# Patient Record
Sex: Female | Born: 1950
Health system: Southern US, Community
[De-identification: ages and names within clinical notes are randomized; demographics above are authoritative.]

## PROBLEM LIST (undated history)

## (undated) DIAGNOSIS — E119 Type 2 diabetes mellitus without complications: Secondary | ICD-10-CM

## (undated) DIAGNOSIS — I10 Essential (primary) hypertension: Secondary | ICD-10-CM

## (undated) DIAGNOSIS — C801 Malignant (primary) neoplasm, unspecified: Secondary | ICD-10-CM

## (undated) HISTORY — PX: CHOLECYSTECTOMY: SHX55

## (undated) HISTORY — PX: BACK SURGERY: SHX140

## (undated) HISTORY — PX: BREAST LUMPECTOMY: SHX2

---

## 2004-08-23 ENCOUNTER — Inpatient Hospital Stay (HOSPITAL_COMMUNITY): Admission: RE | Admit: 2004-08-23 | Discharge: 2004-08-24 | Payer: Self-pay | Admitting: Neurosurgery

## 2004-10-30 ENCOUNTER — Encounter (HOSPITAL_COMMUNITY): Admission: RE | Admit: 2004-10-30 | Discharge: 2004-11-03 | Payer: Self-pay | Admitting: Neurosurgery

## 2004-11-06 ENCOUNTER — Encounter (HOSPITAL_COMMUNITY): Admission: RE | Admit: 2004-11-06 | Discharge: 2004-12-06 | Payer: Self-pay | Admitting: Neurosurgery

## 2004-12-11 ENCOUNTER — Encounter (HOSPITAL_COMMUNITY): Admission: RE | Admit: 2004-12-11 | Discharge: 2005-01-10 | Payer: Self-pay | Admitting: Neurosurgery

## 2005-01-01 ENCOUNTER — Ambulatory Visit (HOSPITAL_COMMUNITY): Admission: RE | Admit: 2005-01-01 | Discharge: 2005-01-01 | Payer: Self-pay | Admitting: Neurosurgery

## 2005-01-11 ENCOUNTER — Encounter (HOSPITAL_COMMUNITY): Admission: RE | Admit: 2005-01-11 | Discharge: 2005-01-15 | Payer: Self-pay | Admitting: Neurosurgery

## 2005-01-17 ENCOUNTER — Ambulatory Visit (HOSPITAL_COMMUNITY): Admission: RE | Admit: 2005-01-17 | Discharge: 2005-01-18 | Payer: Self-pay | Admitting: Neurosurgery

## 2005-02-10 ENCOUNTER — Ambulatory Visit (HOSPITAL_COMMUNITY): Admission: RE | Admit: 2005-02-10 | Discharge: 2005-02-10 | Payer: Self-pay | Admitting: Neurosurgery

## 2005-02-14 ENCOUNTER — Inpatient Hospital Stay (HOSPITAL_COMMUNITY): Admission: RE | Admit: 2005-02-14 | Discharge: 2005-02-19 | Payer: Self-pay | Admitting: Neurosurgery

## 2011-07-29 ENCOUNTER — Encounter: Payer: Self-pay | Admitting: Hematology and Oncology

## 2012-07-29 ENCOUNTER — Encounter (INDEPENDENT_AMBULATORY_CARE_PROVIDER_SITE_OTHER): Payer: Self-pay | Admitting: Internal Medicine

## 2012-07-29 DIAGNOSIS — Z803 Family history of malignant neoplasm of breast: Secondary | ICD-10-CM

## 2012-07-29 DIAGNOSIS — C50919 Malignant neoplasm of unspecified site of unspecified female breast: Secondary | ICD-10-CM

## 2016-02-12 DIAGNOSIS — L820 Inflamed seborrheic keratosis: Secondary | ICD-10-CM | POA: Diagnosis not present

## 2016-02-12 DIAGNOSIS — Z6841 Body Mass Index (BMI) 40.0 and over, adult: Secondary | ICD-10-CM | POA: Diagnosis not present

## 2016-02-12 DIAGNOSIS — Z713 Dietary counseling and surveillance: Secondary | ICD-10-CM | POA: Diagnosis not present

## 2016-02-12 DIAGNOSIS — E1165 Type 2 diabetes mellitus with hyperglycemia: Secondary | ICD-10-CM | POA: Diagnosis not present

## 2016-02-12 DIAGNOSIS — Z299 Encounter for prophylactic measures, unspecified: Secondary | ICD-10-CM | POA: Diagnosis not present

## 2016-02-12 DIAGNOSIS — Z87891 Personal history of nicotine dependence: Secondary | ICD-10-CM | POA: Diagnosis not present

## 2016-02-13 DIAGNOSIS — R69 Illness, unspecified: Secondary | ICD-10-CM | POA: Diagnosis not present

## 2016-02-14 DIAGNOSIS — R69 Illness, unspecified: Secondary | ICD-10-CM | POA: Diagnosis not present

## 2016-02-28 DIAGNOSIS — M5442 Lumbago with sciatica, left side: Secondary | ICD-10-CM | POA: Diagnosis not present

## 2016-03-07 DIAGNOSIS — M5136 Other intervertebral disc degeneration, lumbar region: Secondary | ICD-10-CM | POA: Diagnosis not present

## 2016-03-26 DIAGNOSIS — E119 Type 2 diabetes mellitus without complications: Secondary | ICD-10-CM | POA: Diagnosis not present

## 2016-03-26 DIAGNOSIS — I10 Essential (primary) hypertension: Secondary | ICD-10-CM | POA: Diagnosis not present

## 2016-04-21 DIAGNOSIS — R69 Illness, unspecified: Secondary | ICD-10-CM | POA: Diagnosis not present

## 2016-05-29 DIAGNOSIS — R69 Illness, unspecified: Secondary | ICD-10-CM | POA: Diagnosis not present

## 2016-06-18 DIAGNOSIS — Z299 Encounter for prophylactic measures, unspecified: Secondary | ICD-10-CM | POA: Diagnosis not present

## 2016-06-18 DIAGNOSIS — R69 Illness, unspecified: Secondary | ICD-10-CM | POA: Diagnosis not present

## 2016-06-18 DIAGNOSIS — E1165 Type 2 diabetes mellitus with hyperglycemia: Secondary | ICD-10-CM | POA: Diagnosis not present

## 2016-06-18 DIAGNOSIS — Z6841 Body Mass Index (BMI) 40.0 and over, adult: Secondary | ICD-10-CM | POA: Diagnosis not present

## 2016-06-18 DIAGNOSIS — I10 Essential (primary) hypertension: Secondary | ICD-10-CM | POA: Diagnosis not present

## 2016-06-20 DIAGNOSIS — M5136 Other intervertebral disc degeneration, lumbar region: Secondary | ICD-10-CM | POA: Diagnosis not present

## 2016-07-23 DIAGNOSIS — E119 Type 2 diabetes mellitus without complications: Secondary | ICD-10-CM | POA: Diagnosis not present

## 2016-07-23 DIAGNOSIS — I10 Essential (primary) hypertension: Secondary | ICD-10-CM | POA: Diagnosis not present

## 2016-07-24 DIAGNOSIS — M5442 Lumbago with sciatica, left side: Secondary | ICD-10-CM | POA: Diagnosis not present

## 2016-07-24 DIAGNOSIS — G8929 Other chronic pain: Secondary | ICD-10-CM | POA: Diagnosis not present

## 2016-07-24 DIAGNOSIS — Z6839 Body mass index (BMI) 39.0-39.9, adult: Secondary | ICD-10-CM | POA: Diagnosis not present

## 2016-07-24 DIAGNOSIS — M5136 Other intervertebral disc degeneration, lumbar region: Secondary | ICD-10-CM | POA: Diagnosis not present

## 2016-07-24 DIAGNOSIS — R69 Illness, unspecified: Secondary | ICD-10-CM | POA: Diagnosis not present

## 2016-07-24 DIAGNOSIS — I10 Essential (primary) hypertension: Secondary | ICD-10-CM | POA: Diagnosis not present

## 2016-09-10 DIAGNOSIS — E119 Type 2 diabetes mellitus without complications: Secondary | ICD-10-CM | POA: Diagnosis not present

## 2016-09-10 DIAGNOSIS — I10 Essential (primary) hypertension: Secondary | ICD-10-CM | POA: Diagnosis not present

## 2016-09-18 DIAGNOSIS — H52229 Regular astigmatism, unspecified eye: Secondary | ICD-10-CM | POA: Diagnosis not present

## 2016-09-18 DIAGNOSIS — H5213 Myopia, bilateral: Secondary | ICD-10-CM | POA: Diagnosis not present

## 2016-09-18 DIAGNOSIS — H524 Presbyopia: Secondary | ICD-10-CM | POA: Diagnosis not present

## 2016-09-24 DIAGNOSIS — M5416 Radiculopathy, lumbar region: Secondary | ICD-10-CM | POA: Diagnosis not present

## 2016-09-24 DIAGNOSIS — Z6838 Body mass index (BMI) 38.0-38.9, adult: Secondary | ICD-10-CM | POA: Diagnosis not present

## 2016-09-24 DIAGNOSIS — E1165 Type 2 diabetes mellitus with hyperglycemia: Secondary | ICD-10-CM | POA: Diagnosis not present

## 2016-09-24 DIAGNOSIS — E669 Obesity, unspecified: Secondary | ICD-10-CM | POA: Diagnosis not present

## 2016-09-24 DIAGNOSIS — I10 Essential (primary) hypertension: Secondary | ICD-10-CM | POA: Diagnosis not present

## 2016-09-24 DIAGNOSIS — Z299 Encounter for prophylactic measures, unspecified: Secondary | ICD-10-CM | POA: Diagnosis not present

## 2016-10-01 DIAGNOSIS — M5442 Lumbago with sciatica, left side: Secondary | ICD-10-CM | POA: Diagnosis not present

## 2016-10-01 DIAGNOSIS — M545 Low back pain: Secondary | ICD-10-CM | POA: Diagnosis not present

## 2016-10-17 DIAGNOSIS — H02831 Dermatochalasis of right upper eyelid: Secondary | ICD-10-CM | POA: Diagnosis not present

## 2016-10-17 DIAGNOSIS — H25812 Combined forms of age-related cataract, left eye: Secondary | ICD-10-CM | POA: Diagnosis not present

## 2016-10-17 DIAGNOSIS — H25811 Combined forms of age-related cataract, right eye: Secondary | ICD-10-CM | POA: Diagnosis not present

## 2016-10-17 DIAGNOSIS — E119 Type 2 diabetes mellitus without complications: Secondary | ICD-10-CM | POA: Diagnosis not present

## 2016-10-29 DIAGNOSIS — H25812 Combined forms of age-related cataract, left eye: Secondary | ICD-10-CM | POA: Diagnosis not present

## 2016-10-29 DIAGNOSIS — H2512 Age-related nuclear cataract, left eye: Secondary | ICD-10-CM | POA: Diagnosis not present

## 2016-11-14 DIAGNOSIS — H25811 Combined forms of age-related cataract, right eye: Secondary | ICD-10-CM | POA: Diagnosis not present

## 2016-11-14 DIAGNOSIS — H2511 Age-related nuclear cataract, right eye: Secondary | ICD-10-CM | POA: Diagnosis not present

## 2016-12-04 DIAGNOSIS — I10 Essential (primary) hypertension: Secondary | ICD-10-CM | POA: Diagnosis not present

## 2016-12-04 DIAGNOSIS — E119 Type 2 diabetes mellitus without complications: Secondary | ICD-10-CM | POA: Diagnosis not present

## 2016-12-17 DIAGNOSIS — E1165 Type 2 diabetes mellitus with hyperglycemia: Secondary | ICD-10-CM | POA: Diagnosis not present

## 2016-12-17 DIAGNOSIS — Z299 Encounter for prophylactic measures, unspecified: Secondary | ICD-10-CM | POA: Diagnosis not present

## 2016-12-17 DIAGNOSIS — Z6838 Body mass index (BMI) 38.0-38.9, adult: Secondary | ICD-10-CM | POA: Diagnosis not present

## 2016-12-17 DIAGNOSIS — I10 Essential (primary) hypertension: Secondary | ICD-10-CM | POA: Diagnosis not present

## 2016-12-17 DIAGNOSIS — M545 Low back pain: Secondary | ICD-10-CM | POA: Diagnosis not present

## 2016-12-31 DIAGNOSIS — M545 Low back pain: Secondary | ICD-10-CM | POA: Diagnosis not present

## 2016-12-31 DIAGNOSIS — Z6838 Body mass index (BMI) 38.0-38.9, adult: Secondary | ICD-10-CM | POA: Diagnosis not present

## 2016-12-31 DIAGNOSIS — E1165 Type 2 diabetes mellitus with hyperglycemia: Secondary | ICD-10-CM | POA: Diagnosis not present

## 2016-12-31 DIAGNOSIS — I10 Essential (primary) hypertension: Secondary | ICD-10-CM | POA: Diagnosis not present

## 2016-12-31 DIAGNOSIS — Z299 Encounter for prophylactic measures, unspecified: Secondary | ICD-10-CM | POA: Diagnosis not present

## 2016-12-31 DIAGNOSIS — R600 Localized edema: Secondary | ICD-10-CM | POA: Diagnosis not present

## 2017-01-01 DIAGNOSIS — I10 Essential (primary) hypertension: Secondary | ICD-10-CM | POA: Diagnosis not present

## 2017-01-01 DIAGNOSIS — E119 Type 2 diabetes mellitus without complications: Secondary | ICD-10-CM | POA: Diagnosis not present

## 2017-01-10 DIAGNOSIS — I10 Essential (primary) hypertension: Secondary | ICD-10-CM | POA: Diagnosis not present

## 2017-01-10 DIAGNOSIS — M5136 Other intervertebral disc degeneration, lumbar region: Secondary | ICD-10-CM | POA: Diagnosis not present

## 2017-01-10 DIAGNOSIS — M5442 Lumbago with sciatica, left side: Secondary | ICD-10-CM | POA: Diagnosis not present

## 2017-01-10 DIAGNOSIS — Z6839 Body mass index (BMI) 39.0-39.9, adult: Secondary | ICD-10-CM | POA: Diagnosis not present

## 2017-01-31 DIAGNOSIS — I10 Essential (primary) hypertension: Secondary | ICD-10-CM | POA: Diagnosis not present

## 2017-01-31 DIAGNOSIS — E119 Type 2 diabetes mellitus without complications: Secondary | ICD-10-CM | POA: Diagnosis not present

## 2017-02-17 DIAGNOSIS — M5136 Other intervertebral disc degeneration, lumbar region: Secondary | ICD-10-CM | POA: Diagnosis not present

## 2017-02-17 DIAGNOSIS — M5416 Radiculopathy, lumbar region: Secondary | ICD-10-CM | POA: Diagnosis not present

## 2017-02-21 DIAGNOSIS — R69 Illness, unspecified: Secondary | ICD-10-CM | POA: Diagnosis not present

## 2017-02-27 DIAGNOSIS — E119 Type 2 diabetes mellitus without complications: Secondary | ICD-10-CM | POA: Diagnosis not present

## 2017-02-27 DIAGNOSIS — I10 Essential (primary) hypertension: Secondary | ICD-10-CM | POA: Diagnosis not present

## 2017-05-01 DIAGNOSIS — R69 Illness, unspecified: Secondary | ICD-10-CM | POA: Diagnosis not present

## 2017-06-02 DIAGNOSIS — E1165 Type 2 diabetes mellitus with hyperglycemia: Secondary | ICD-10-CM | POA: Diagnosis not present

## 2017-06-02 DIAGNOSIS — Z6838 Body mass index (BMI) 38.0-38.9, adult: Secondary | ICD-10-CM | POA: Diagnosis not present

## 2017-06-02 DIAGNOSIS — Z299 Encounter for prophylactic measures, unspecified: Secondary | ICD-10-CM | POA: Diagnosis not present

## 2017-06-02 DIAGNOSIS — I10 Essential (primary) hypertension: Secondary | ICD-10-CM | POA: Diagnosis not present

## 2017-06-06 DIAGNOSIS — E119 Type 2 diabetes mellitus without complications: Secondary | ICD-10-CM | POA: Diagnosis not present

## 2017-06-06 DIAGNOSIS — I10 Essential (primary) hypertension: Secondary | ICD-10-CM | POA: Diagnosis not present

## 2017-07-07 DIAGNOSIS — M5136 Other intervertebral disc degeneration, lumbar region: Secondary | ICD-10-CM | POA: Diagnosis not present

## 2017-07-07 DIAGNOSIS — M5416 Radiculopathy, lumbar region: Secondary | ICD-10-CM | POA: Diagnosis not present

## 2017-08-28 DIAGNOSIS — E119 Type 2 diabetes mellitus without complications: Secondary | ICD-10-CM | POA: Diagnosis not present

## 2017-08-28 DIAGNOSIS — I10 Essential (primary) hypertension: Secondary | ICD-10-CM | POA: Diagnosis not present

## 2017-08-29 DIAGNOSIS — R69 Illness, unspecified: Secondary | ICD-10-CM | POA: Diagnosis not present

## 2017-09-05 DIAGNOSIS — Z299 Encounter for prophylactic measures, unspecified: Secondary | ICD-10-CM | POA: Diagnosis not present

## 2017-09-05 DIAGNOSIS — E1165 Type 2 diabetes mellitus with hyperglycemia: Secondary | ICD-10-CM | POA: Diagnosis not present

## 2017-09-05 DIAGNOSIS — I10 Essential (primary) hypertension: Secondary | ICD-10-CM | POA: Diagnosis not present

## 2017-09-05 DIAGNOSIS — Z6839 Body mass index (BMI) 39.0-39.9, adult: Secondary | ICD-10-CM | POA: Diagnosis not present

## 2017-09-08 DIAGNOSIS — E7800 Pure hypercholesterolemia, unspecified: Secondary | ICD-10-CM | POA: Diagnosis not present

## 2017-09-08 DIAGNOSIS — Z7189 Other specified counseling: Secondary | ICD-10-CM | POA: Diagnosis not present

## 2017-09-08 DIAGNOSIS — Z6841 Body Mass Index (BMI) 40.0 and over, adult: Secondary | ICD-10-CM | POA: Diagnosis not present

## 2017-09-08 DIAGNOSIS — Z0000 Encounter for general adult medical examination without abnormal findings: Secondary | ICD-10-CM | POA: Diagnosis not present

## 2017-09-08 DIAGNOSIS — Z1211 Encounter for screening for malignant neoplasm of colon: Secondary | ICD-10-CM | POA: Diagnosis not present

## 2017-09-08 DIAGNOSIS — Z299 Encounter for prophylactic measures, unspecified: Secondary | ICD-10-CM | POA: Diagnosis not present

## 2017-09-08 DIAGNOSIS — Z79899 Other long term (current) drug therapy: Secondary | ICD-10-CM | POA: Diagnosis not present

## 2017-09-08 DIAGNOSIS — E1165 Type 2 diabetes mellitus with hyperglycemia: Secondary | ICD-10-CM | POA: Diagnosis not present

## 2017-09-08 DIAGNOSIS — R5383 Other fatigue: Secondary | ICD-10-CM | POA: Diagnosis not present

## 2017-09-08 DIAGNOSIS — Z1331 Encounter for screening for depression: Secondary | ICD-10-CM | POA: Diagnosis not present

## 2017-09-08 DIAGNOSIS — Z1339 Encounter for screening examination for other mental health and behavioral disorders: Secondary | ICD-10-CM | POA: Diagnosis not present

## 2017-09-30 DIAGNOSIS — E2839 Other primary ovarian failure: Secondary | ICD-10-CM | POA: Diagnosis not present

## 2017-09-30 DIAGNOSIS — Z79899 Other long term (current) drug therapy: Secondary | ICD-10-CM | POA: Diagnosis not present

## 2017-11-07 DIAGNOSIS — I10 Essential (primary) hypertension: Secondary | ICD-10-CM | POA: Diagnosis not present

## 2017-11-07 DIAGNOSIS — E119 Type 2 diabetes mellitus without complications: Secondary | ICD-10-CM | POA: Diagnosis not present

## 2017-12-04 DIAGNOSIS — M5136 Other intervertebral disc degeneration, lumbar region: Secondary | ICD-10-CM | POA: Diagnosis not present

## 2018-01-02 DIAGNOSIS — R69 Illness, unspecified: Secondary | ICD-10-CM | POA: Diagnosis not present

## 2018-01-07 DIAGNOSIS — I10 Essential (primary) hypertension: Secondary | ICD-10-CM | POA: Diagnosis not present

## 2018-01-07 DIAGNOSIS — E119 Type 2 diabetes mellitus without complications: Secondary | ICD-10-CM | POA: Diagnosis not present

## 2018-01-08 DIAGNOSIS — E1165 Type 2 diabetes mellitus with hyperglycemia: Secondary | ICD-10-CM | POA: Diagnosis not present

## 2018-01-08 DIAGNOSIS — R350 Frequency of micturition: Secondary | ICD-10-CM | POA: Diagnosis not present

## 2018-01-08 DIAGNOSIS — Z299 Encounter for prophylactic measures, unspecified: Secondary | ICD-10-CM | POA: Diagnosis not present

## 2018-01-08 DIAGNOSIS — I10 Essential (primary) hypertension: Secondary | ICD-10-CM | POA: Diagnosis not present

## 2018-01-08 DIAGNOSIS — Z6841 Body Mass Index (BMI) 40.0 and over, adult: Secondary | ICD-10-CM | POA: Diagnosis not present

## 2018-01-08 DIAGNOSIS — N390 Urinary tract infection, site not specified: Secondary | ICD-10-CM | POA: Diagnosis not present

## 2018-01-21 DIAGNOSIS — E1165 Type 2 diabetes mellitus with hyperglycemia: Secondary | ICD-10-CM | POA: Diagnosis not present

## 2018-01-21 DIAGNOSIS — Z299 Encounter for prophylactic measures, unspecified: Secondary | ICD-10-CM | POA: Diagnosis not present

## 2018-01-21 DIAGNOSIS — S81819A Laceration without foreign body, unspecified lower leg, initial encounter: Secondary | ICD-10-CM | POA: Diagnosis not present

## 2018-01-21 DIAGNOSIS — I10 Essential (primary) hypertension: Secondary | ICD-10-CM | POA: Diagnosis not present

## 2018-01-21 DIAGNOSIS — Z6841 Body Mass Index (BMI) 40.0 and over, adult: Secondary | ICD-10-CM | POA: Diagnosis not present

## 2018-02-09 DIAGNOSIS — E119 Type 2 diabetes mellitus without complications: Secondary | ICD-10-CM | POA: Diagnosis not present

## 2018-02-09 DIAGNOSIS — I10 Essential (primary) hypertension: Secondary | ICD-10-CM | POA: Diagnosis not present

## 2018-03-10 DIAGNOSIS — M5416 Radiculopathy, lumbar region: Secondary | ICD-10-CM | POA: Diagnosis not present

## 2018-04-06 DIAGNOSIS — R531 Weakness: Secondary | ICD-10-CM | POA: Diagnosis not present

## 2018-04-06 DIAGNOSIS — I10 Essential (primary) hypertension: Secondary | ICD-10-CM | POA: Diagnosis not present

## 2018-04-06 DIAGNOSIS — R110 Nausea: Secondary | ICD-10-CM | POA: Diagnosis not present

## 2018-04-14 DIAGNOSIS — E1165 Type 2 diabetes mellitus with hyperglycemia: Secondary | ICD-10-CM | POA: Diagnosis not present

## 2018-04-14 DIAGNOSIS — I10 Essential (primary) hypertension: Secondary | ICD-10-CM | POA: Diagnosis not present

## 2018-04-14 DIAGNOSIS — Z6839 Body mass index (BMI) 39.0-39.9, adult: Secondary | ICD-10-CM | POA: Diagnosis not present

## 2018-04-14 DIAGNOSIS — Z299 Encounter for prophylactic measures, unspecified: Secondary | ICD-10-CM | POA: Diagnosis not present

## 2018-04-16 DIAGNOSIS — R69 Illness, unspecified: Secondary | ICD-10-CM | POA: Diagnosis not present

## 2018-05-19 DIAGNOSIS — I10 Essential (primary) hypertension: Secondary | ICD-10-CM | POA: Diagnosis not present

## 2018-05-19 DIAGNOSIS — E119 Type 2 diabetes mellitus without complications: Secondary | ICD-10-CM | POA: Diagnosis not present

## 2018-07-09 DIAGNOSIS — E119 Type 2 diabetes mellitus without complications: Secondary | ICD-10-CM | POA: Diagnosis not present

## 2018-07-09 DIAGNOSIS — I10 Essential (primary) hypertension: Secondary | ICD-10-CM | POA: Diagnosis not present

## 2018-07-13 DIAGNOSIS — M5416 Radiculopathy, lumbar region: Secondary | ICD-10-CM | POA: Diagnosis not present

## 2018-07-21 DIAGNOSIS — R350 Frequency of micturition: Secondary | ICD-10-CM | POA: Diagnosis not present

## 2018-07-21 DIAGNOSIS — I10 Essential (primary) hypertension: Secondary | ICD-10-CM | POA: Diagnosis not present

## 2018-07-21 DIAGNOSIS — Z6838 Body mass index (BMI) 38.0-38.9, adult: Secondary | ICD-10-CM | POA: Diagnosis not present

## 2018-07-21 DIAGNOSIS — E1165 Type 2 diabetes mellitus with hyperglycemia: Secondary | ICD-10-CM | POA: Diagnosis not present

## 2018-07-21 DIAGNOSIS — Z299 Encounter for prophylactic measures, unspecified: Secondary | ICD-10-CM | POA: Diagnosis not present

## 2018-07-21 DIAGNOSIS — N390 Urinary tract infection, site not specified: Secondary | ICD-10-CM | POA: Diagnosis not present

## 2018-08-11 DIAGNOSIS — M5136 Other intervertebral disc degeneration, lumbar region: Secondary | ICD-10-CM | POA: Diagnosis not present

## 2018-08-11 DIAGNOSIS — M5416 Radiculopathy, lumbar region: Secondary | ICD-10-CM | POA: Diagnosis not present

## 2018-10-02 DIAGNOSIS — E119 Type 2 diabetes mellitus without complications: Secondary | ICD-10-CM | POA: Diagnosis not present

## 2018-10-02 DIAGNOSIS — Z1211 Encounter for screening for malignant neoplasm of colon: Secondary | ICD-10-CM | POA: Diagnosis not present

## 2018-10-02 DIAGNOSIS — I10 Essential (primary) hypertension: Secondary | ICD-10-CM | POA: Diagnosis not present

## 2018-10-02 DIAGNOSIS — Z79899 Other long term (current) drug therapy: Secondary | ICD-10-CM | POA: Diagnosis not present

## 2018-10-02 DIAGNOSIS — Z7189 Other specified counseling: Secondary | ICD-10-CM | POA: Diagnosis not present

## 2018-10-02 DIAGNOSIS — Z6838 Body mass index (BMI) 38.0-38.9, adult: Secondary | ICD-10-CM | POA: Diagnosis not present

## 2018-10-02 DIAGNOSIS — Z0000 Encounter for general adult medical examination without abnormal findings: Secondary | ICD-10-CM | POA: Diagnosis not present

## 2018-10-02 DIAGNOSIS — E1165 Type 2 diabetes mellitus with hyperglycemia: Secondary | ICD-10-CM | POA: Diagnosis not present

## 2018-10-02 DIAGNOSIS — E559 Vitamin D deficiency, unspecified: Secondary | ICD-10-CM | POA: Diagnosis not present

## 2018-10-02 DIAGNOSIS — R69 Illness, unspecified: Secondary | ICD-10-CM | POA: Diagnosis not present

## 2018-10-02 DIAGNOSIS — Z1339 Encounter for screening examination for other mental health and behavioral disorders: Secondary | ICD-10-CM | POA: Diagnosis not present

## 2018-10-02 DIAGNOSIS — Z1331 Encounter for screening for depression: Secondary | ICD-10-CM | POA: Diagnosis not present

## 2018-10-02 DIAGNOSIS — E7800 Pure hypercholesterolemia, unspecified: Secondary | ICD-10-CM | POA: Diagnosis not present

## 2018-10-02 DIAGNOSIS — Z299 Encounter for prophylactic measures, unspecified: Secondary | ICD-10-CM | POA: Diagnosis not present

## 2018-10-08 DIAGNOSIS — I10 Essential (primary) hypertension: Secondary | ICD-10-CM | POA: Diagnosis not present

## 2018-10-08 DIAGNOSIS — E119 Type 2 diabetes mellitus without complications: Secondary | ICD-10-CM | POA: Diagnosis not present

## 2018-10-10 DIAGNOSIS — R69 Illness, unspecified: Secondary | ICD-10-CM | POA: Diagnosis not present

## 2018-10-13 DIAGNOSIS — M5416 Radiculopathy, lumbar region: Secondary | ICD-10-CM | POA: Diagnosis not present

## 2018-10-20 DIAGNOSIS — E1140 Type 2 diabetes mellitus with diabetic neuropathy, unspecified: Secondary | ICD-10-CM | POA: Diagnosis not present

## 2018-10-20 DIAGNOSIS — M79605 Pain in left leg: Secondary | ICD-10-CM | POA: Diagnosis not present

## 2018-10-20 DIAGNOSIS — E1159 Type 2 diabetes mellitus with other circulatory complications: Secondary | ICD-10-CM | POA: Diagnosis not present

## 2018-10-20 DIAGNOSIS — M79604 Pain in right leg: Secondary | ICD-10-CM | POA: Diagnosis not present

## 2018-10-27 DIAGNOSIS — I10 Essential (primary) hypertension: Secondary | ICD-10-CM | POA: Diagnosis not present

## 2018-10-27 DIAGNOSIS — R350 Frequency of micturition: Secondary | ICD-10-CM | POA: Diagnosis not present

## 2018-10-27 DIAGNOSIS — Z299 Encounter for prophylactic measures, unspecified: Secondary | ICD-10-CM | POA: Diagnosis not present

## 2018-10-27 DIAGNOSIS — E1165 Type 2 diabetes mellitus with hyperglycemia: Secondary | ICD-10-CM | POA: Diagnosis not present

## 2018-10-27 DIAGNOSIS — Z6838 Body mass index (BMI) 38.0-38.9, adult: Secondary | ICD-10-CM | POA: Diagnosis not present

## 2018-11-03 DIAGNOSIS — Z6839 Body mass index (BMI) 39.0-39.9, adult: Secondary | ICD-10-CM | POA: Diagnosis not present

## 2018-11-03 DIAGNOSIS — E1165 Type 2 diabetes mellitus with hyperglycemia: Secondary | ICD-10-CM | POA: Diagnosis not present

## 2018-11-03 DIAGNOSIS — I10 Essential (primary) hypertension: Secondary | ICD-10-CM | POA: Diagnosis not present

## 2018-11-03 DIAGNOSIS — Z299 Encounter for prophylactic measures, unspecified: Secondary | ICD-10-CM | POA: Diagnosis not present

## 2018-11-17 DIAGNOSIS — E119 Type 2 diabetes mellitus without complications: Secondary | ICD-10-CM | POA: Diagnosis not present

## 2018-11-17 DIAGNOSIS — I10 Essential (primary) hypertension: Secondary | ICD-10-CM | POA: Diagnosis not present

## 2019-01-07 DIAGNOSIS — I10 Essential (primary) hypertension: Secondary | ICD-10-CM | POA: Diagnosis not present

## 2019-01-07 DIAGNOSIS — E119 Type 2 diabetes mellitus without complications: Secondary | ICD-10-CM | POA: Diagnosis not present

## 2019-02-09 DIAGNOSIS — R609 Edema, unspecified: Secondary | ICD-10-CM | POA: Diagnosis not present

## 2019-02-09 DIAGNOSIS — I10 Essential (primary) hypertension: Secondary | ICD-10-CM | POA: Diagnosis not present

## 2019-02-09 DIAGNOSIS — Z299 Encounter for prophylactic measures, unspecified: Secondary | ICD-10-CM | POA: Diagnosis not present

## 2019-02-09 DIAGNOSIS — Z6838 Body mass index (BMI) 38.0-38.9, adult: Secondary | ICD-10-CM | POA: Diagnosis not present

## 2019-02-09 DIAGNOSIS — E1165 Type 2 diabetes mellitus with hyperglycemia: Secondary | ICD-10-CM | POA: Diagnosis not present

## 2019-02-15 DIAGNOSIS — M5416 Radiculopathy, lumbar region: Secondary | ICD-10-CM | POA: Diagnosis not present

## 2019-02-15 DIAGNOSIS — M5136 Other intervertebral disc degeneration, lumbar region: Secondary | ICD-10-CM | POA: Diagnosis not present

## 2019-02-23 DIAGNOSIS — E119 Type 2 diabetes mellitus without complications: Secondary | ICD-10-CM | POA: Diagnosis not present

## 2019-02-23 DIAGNOSIS — I10 Essential (primary) hypertension: Secondary | ICD-10-CM | POA: Diagnosis not present

## 2019-03-09 DIAGNOSIS — R350 Frequency of micturition: Secondary | ICD-10-CM | POA: Diagnosis not present

## 2019-03-09 DIAGNOSIS — I10 Essential (primary) hypertension: Secondary | ICD-10-CM | POA: Diagnosis not present

## 2019-03-09 DIAGNOSIS — N390 Urinary tract infection, site not specified: Secondary | ICD-10-CM | POA: Diagnosis not present

## 2019-03-09 DIAGNOSIS — E1165 Type 2 diabetes mellitus with hyperglycemia: Secondary | ICD-10-CM | POA: Diagnosis not present

## 2019-03-09 DIAGNOSIS — Z299 Encounter for prophylactic measures, unspecified: Secondary | ICD-10-CM | POA: Diagnosis not present

## 2019-03-09 DIAGNOSIS — Z6839 Body mass index (BMI) 39.0-39.9, adult: Secondary | ICD-10-CM | POA: Diagnosis not present

## 2019-04-06 DIAGNOSIS — M5416 Radiculopathy, lumbar region: Secondary | ICD-10-CM | POA: Diagnosis not present

## 2019-04-16 DIAGNOSIS — E119 Type 2 diabetes mellitus without complications: Secondary | ICD-10-CM | POA: Diagnosis not present

## 2019-04-16 DIAGNOSIS — I10 Essential (primary) hypertension: Secondary | ICD-10-CM | POA: Diagnosis not present

## 2019-05-18 DIAGNOSIS — Z6839 Body mass index (BMI) 39.0-39.9, adult: Secondary | ICD-10-CM | POA: Diagnosis not present

## 2019-05-18 DIAGNOSIS — Z299 Encounter for prophylactic measures, unspecified: Secondary | ICD-10-CM | POA: Diagnosis not present

## 2019-05-18 DIAGNOSIS — E1165 Type 2 diabetes mellitus with hyperglycemia: Secondary | ICD-10-CM | POA: Diagnosis not present

## 2019-05-18 DIAGNOSIS — I10 Essential (primary) hypertension: Secondary | ICD-10-CM | POA: Diagnosis not present

## 2019-05-18 DIAGNOSIS — R69 Illness, unspecified: Secondary | ICD-10-CM | POA: Diagnosis not present

## 2019-05-19 DIAGNOSIS — I10 Essential (primary) hypertension: Secondary | ICD-10-CM | POA: Diagnosis not present

## 2019-05-19 DIAGNOSIS — E119 Type 2 diabetes mellitus without complications: Secondary | ICD-10-CM | POA: Diagnosis not present

## 2019-06-08 DIAGNOSIS — I10 Essential (primary) hypertension: Secondary | ICD-10-CM | POA: Diagnosis not present

## 2019-06-08 DIAGNOSIS — Z299 Encounter for prophylactic measures, unspecified: Secondary | ICD-10-CM | POA: Diagnosis not present

## 2019-06-08 DIAGNOSIS — R69 Illness, unspecified: Secondary | ICD-10-CM | POA: Diagnosis not present

## 2019-06-08 DIAGNOSIS — Z6839 Body mass index (BMI) 39.0-39.9, adult: Secondary | ICD-10-CM | POA: Diagnosis not present

## 2019-06-08 DIAGNOSIS — E1165 Type 2 diabetes mellitus with hyperglycemia: Secondary | ICD-10-CM | POA: Diagnosis not present

## 2019-06-15 DIAGNOSIS — M5136 Other intervertebral disc degeneration, lumbar region: Secondary | ICD-10-CM | POA: Diagnosis not present

## 2019-06-15 DIAGNOSIS — M5416 Radiculopathy, lumbar region: Secondary | ICD-10-CM | POA: Diagnosis not present

## 2019-07-04 DIAGNOSIS — I10 Essential (primary) hypertension: Secondary | ICD-10-CM | POA: Diagnosis not present

## 2019-07-04 DIAGNOSIS — E119 Type 2 diabetes mellitus without complications: Secondary | ICD-10-CM | POA: Diagnosis not present

## 2019-07-15 DIAGNOSIS — M545 Low back pain: Secondary | ICD-10-CM | POA: Diagnosis not present

## 2019-07-15 DIAGNOSIS — M5416 Radiculopathy, lumbar region: Secondary | ICD-10-CM | POA: Diagnosis not present

## 2019-08-25 DIAGNOSIS — N390 Urinary tract infection, site not specified: Secondary | ICD-10-CM | POA: Diagnosis not present

## 2019-08-25 DIAGNOSIS — Z299 Encounter for prophylactic measures, unspecified: Secondary | ICD-10-CM | POA: Diagnosis not present

## 2019-08-25 DIAGNOSIS — E1165 Type 2 diabetes mellitus with hyperglycemia: Secondary | ICD-10-CM | POA: Diagnosis not present

## 2019-08-25 DIAGNOSIS — I10 Essential (primary) hypertension: Secondary | ICD-10-CM | POA: Diagnosis not present

## 2019-08-25 DIAGNOSIS — R339 Retention of urine, unspecified: Secondary | ICD-10-CM | POA: Diagnosis not present

## 2019-08-25 DIAGNOSIS — R69 Illness, unspecified: Secondary | ICD-10-CM | POA: Diagnosis not present

## 2019-08-30 DIAGNOSIS — M5416 Radiculopathy, lumbar region: Secondary | ICD-10-CM | POA: Diagnosis not present

## 2019-10-05 DIAGNOSIS — R5383 Other fatigue: Secondary | ICD-10-CM | POA: Diagnosis not present

## 2019-10-05 DIAGNOSIS — Z0000 Encounter for general adult medical examination without abnormal findings: Secondary | ICD-10-CM | POA: Diagnosis not present

## 2019-10-05 DIAGNOSIS — Z7189 Other specified counseling: Secondary | ICD-10-CM | POA: Diagnosis not present

## 2019-10-05 DIAGNOSIS — Z6838 Body mass index (BMI) 38.0-38.9, adult: Secondary | ICD-10-CM | POA: Diagnosis not present

## 2019-10-05 DIAGNOSIS — Z1331 Encounter for screening for depression: Secondary | ICD-10-CM | POA: Diagnosis not present

## 2019-10-05 DIAGNOSIS — R350 Frequency of micturition: Secondary | ICD-10-CM | POA: Diagnosis not present

## 2019-10-05 DIAGNOSIS — E119 Type 2 diabetes mellitus without complications: Secondary | ICD-10-CM | POA: Diagnosis not present

## 2019-10-05 DIAGNOSIS — E7800 Pure hypercholesterolemia, unspecified: Secondary | ICD-10-CM | POA: Diagnosis not present

## 2019-10-05 DIAGNOSIS — E1165 Type 2 diabetes mellitus with hyperglycemia: Secondary | ICD-10-CM | POA: Diagnosis not present

## 2019-10-05 DIAGNOSIS — Z1339 Encounter for screening examination for other mental health and behavioral disorders: Secondary | ICD-10-CM | POA: Diagnosis not present

## 2019-11-08 DIAGNOSIS — Z299 Encounter for prophylactic measures, unspecified: Secondary | ICD-10-CM | POA: Diagnosis not present

## 2019-11-08 DIAGNOSIS — Z6839 Body mass index (BMI) 39.0-39.9, adult: Secondary | ICD-10-CM | POA: Diagnosis not present

## 2019-11-08 DIAGNOSIS — E1165 Type 2 diabetes mellitus with hyperglycemia: Secondary | ICD-10-CM | POA: Diagnosis not present

## 2019-11-08 DIAGNOSIS — M545 Low back pain, unspecified: Secondary | ICD-10-CM | POA: Diagnosis not present

## 2019-11-08 DIAGNOSIS — G8929 Other chronic pain: Secondary | ICD-10-CM | POA: Diagnosis not present

## 2019-11-08 DIAGNOSIS — I10 Essential (primary) hypertension: Secondary | ICD-10-CM | POA: Diagnosis not present

## 2019-12-06 DIAGNOSIS — E1165 Type 2 diabetes mellitus with hyperglycemia: Secondary | ICD-10-CM | POA: Diagnosis not present

## 2019-12-06 DIAGNOSIS — Z6838 Body mass index (BMI) 38.0-38.9, adult: Secondary | ICD-10-CM | POA: Diagnosis not present

## 2019-12-06 DIAGNOSIS — I10 Essential (primary) hypertension: Secondary | ICD-10-CM | POA: Diagnosis not present

## 2019-12-06 DIAGNOSIS — Z23 Encounter for immunization: Secondary | ICD-10-CM | POA: Diagnosis not present

## 2019-12-06 DIAGNOSIS — R69 Illness, unspecified: Secondary | ICD-10-CM | POA: Diagnosis not present

## 2019-12-06 DIAGNOSIS — Z299 Encounter for prophylactic measures, unspecified: Secondary | ICD-10-CM | POA: Diagnosis not present

## 2019-12-28 DIAGNOSIS — Z1231 Encounter for screening mammogram for malignant neoplasm of breast: Secondary | ICD-10-CM | POA: Diagnosis not present

## 2020-01-10 DIAGNOSIS — G4700 Insomnia, unspecified: Secondary | ICD-10-CM | POA: Diagnosis not present

## 2020-01-10 DIAGNOSIS — M961 Postlaminectomy syndrome, not elsewhere classified: Secondary | ICD-10-CM | POA: Diagnosis not present

## 2020-01-10 DIAGNOSIS — M545 Low back pain, unspecified: Secondary | ICD-10-CM | POA: Diagnosis not present

## 2020-01-10 DIAGNOSIS — G20 Parkinson's disease: Secondary | ICD-10-CM | POA: Diagnosis not present

## 2020-01-10 DIAGNOSIS — Z79891 Long term (current) use of opiate analgesic: Secondary | ICD-10-CM | POA: Diagnosis not present

## 2020-01-10 DIAGNOSIS — G894 Chronic pain syndrome: Secondary | ICD-10-CM | POA: Diagnosis not present

## 2020-01-10 DIAGNOSIS — G4762 Sleep related leg cramps: Secondary | ICD-10-CM | POA: Diagnosis not present

## 2020-01-10 DIAGNOSIS — R278 Other lack of coordination: Secondary | ICD-10-CM | POA: Diagnosis not present

## 2020-01-10 DIAGNOSIS — E1142 Type 2 diabetes mellitus with diabetic polyneuropathy: Secondary | ICD-10-CM | POA: Diagnosis not present

## 2020-01-10 DIAGNOSIS — R252 Cramp and spasm: Secondary | ICD-10-CM | POA: Diagnosis not present

## 2020-02-09 DIAGNOSIS — G894 Chronic pain syndrome: Secondary | ICD-10-CM | POA: Diagnosis not present

## 2020-02-09 DIAGNOSIS — G20 Parkinson's disease: Secondary | ICD-10-CM | POA: Diagnosis not present

## 2020-02-09 DIAGNOSIS — M961 Postlaminectomy syndrome, not elsewhere classified: Secondary | ICD-10-CM | POA: Diagnosis not present

## 2020-02-09 DIAGNOSIS — G4700 Insomnia, unspecified: Secondary | ICD-10-CM | POA: Diagnosis not present

## 2020-02-09 DIAGNOSIS — E1142 Type 2 diabetes mellitus with diabetic polyneuropathy: Secondary | ICD-10-CM | POA: Diagnosis not present

## 2020-02-09 DIAGNOSIS — G4762 Sleep related leg cramps: Secondary | ICD-10-CM | POA: Diagnosis not present

## 2020-02-09 DIAGNOSIS — R278 Other lack of coordination: Secondary | ICD-10-CM | POA: Diagnosis not present

## 2020-02-09 DIAGNOSIS — R252 Cramp and spasm: Secondary | ICD-10-CM | POA: Diagnosis not present

## 2020-03-06 DIAGNOSIS — E1165 Type 2 diabetes mellitus with hyperglycemia: Secondary | ICD-10-CM | POA: Diagnosis not present

## 2020-03-06 DIAGNOSIS — E7800 Pure hypercholesterolemia, unspecified: Secondary | ICD-10-CM | POA: Diagnosis not present

## 2020-03-06 DIAGNOSIS — I10 Essential (primary) hypertension: Secondary | ICD-10-CM | POA: Diagnosis not present

## 2020-03-06 DIAGNOSIS — R600 Localized edema: Secondary | ICD-10-CM | POA: Diagnosis not present

## 2020-03-16 DIAGNOSIS — Z299 Encounter for prophylactic measures, unspecified: Secondary | ICD-10-CM | POA: Diagnosis not present

## 2020-03-16 DIAGNOSIS — Z1211 Encounter for screening for malignant neoplasm of colon: Secondary | ICD-10-CM | POA: Diagnosis not present

## 2020-03-16 DIAGNOSIS — E1165 Type 2 diabetes mellitus with hyperglycemia: Secondary | ICD-10-CM | POA: Diagnosis not present

## 2020-03-16 DIAGNOSIS — R69 Illness, unspecified: Secondary | ICD-10-CM | POA: Diagnosis not present

## 2020-03-16 DIAGNOSIS — Z6837 Body mass index (BMI) 37.0-37.9, adult: Secondary | ICD-10-CM | POA: Diagnosis not present

## 2020-03-16 DIAGNOSIS — I10 Essential (primary) hypertension: Secondary | ICD-10-CM | POA: Diagnosis not present

## 2020-03-16 DIAGNOSIS — Z87891 Personal history of nicotine dependence: Secondary | ICD-10-CM | POA: Diagnosis not present

## 2020-04-05 DIAGNOSIS — R252 Cramp and spasm: Secondary | ICD-10-CM | POA: Diagnosis not present

## 2020-04-05 DIAGNOSIS — G20 Parkinson's disease: Secondary | ICD-10-CM | POA: Diagnosis not present

## 2020-04-05 DIAGNOSIS — E1142 Type 2 diabetes mellitus with diabetic polyneuropathy: Secondary | ICD-10-CM | POA: Diagnosis not present

## 2020-04-05 DIAGNOSIS — R278 Other lack of coordination: Secondary | ICD-10-CM | POA: Diagnosis not present

## 2020-04-05 DIAGNOSIS — G4700 Insomnia, unspecified: Secondary | ICD-10-CM | POA: Diagnosis not present

## 2020-04-05 DIAGNOSIS — G4762 Sleep related leg cramps: Secondary | ICD-10-CM | POA: Diagnosis not present

## 2020-04-05 DIAGNOSIS — G894 Chronic pain syndrome: Secondary | ICD-10-CM | POA: Diagnosis not present

## 2020-04-05 DIAGNOSIS — M961 Postlaminectomy syndrome, not elsewhere classified: Secondary | ICD-10-CM | POA: Diagnosis not present

## 2020-05-03 DIAGNOSIS — E7800 Pure hypercholesterolemia, unspecified: Secondary | ICD-10-CM | POA: Diagnosis not present

## 2020-05-03 DIAGNOSIS — I10 Essential (primary) hypertension: Secondary | ICD-10-CM | POA: Diagnosis not present

## 2020-05-03 DIAGNOSIS — R600 Localized edema: Secondary | ICD-10-CM | POA: Diagnosis not present

## 2020-05-03 DIAGNOSIS — E1165 Type 2 diabetes mellitus with hyperglycemia: Secondary | ICD-10-CM | POA: Diagnosis not present

## 2020-05-31 DIAGNOSIS — R252 Cramp and spasm: Secondary | ICD-10-CM | POA: Diagnosis not present

## 2020-05-31 DIAGNOSIS — M961 Postlaminectomy syndrome, not elsewhere classified: Secondary | ICD-10-CM | POA: Diagnosis not present

## 2020-05-31 DIAGNOSIS — G4762 Sleep related leg cramps: Secondary | ICD-10-CM | POA: Diagnosis not present

## 2020-05-31 DIAGNOSIS — G20 Parkinson's disease: Secondary | ICD-10-CM | POA: Diagnosis not present

## 2020-05-31 DIAGNOSIS — R278 Other lack of coordination: Secondary | ICD-10-CM | POA: Diagnosis not present

## 2020-05-31 DIAGNOSIS — G4700 Insomnia, unspecified: Secondary | ICD-10-CM | POA: Diagnosis not present

## 2020-05-31 DIAGNOSIS — G894 Chronic pain syndrome: Secondary | ICD-10-CM | POA: Diagnosis not present

## 2020-05-31 DIAGNOSIS — E1142 Type 2 diabetes mellitus with diabetic polyneuropathy: Secondary | ICD-10-CM | POA: Diagnosis not present

## 2020-06-19 DIAGNOSIS — Z299 Encounter for prophylactic measures, unspecified: Secondary | ICD-10-CM | POA: Diagnosis not present

## 2020-06-19 DIAGNOSIS — E1165 Type 2 diabetes mellitus with hyperglycemia: Secondary | ICD-10-CM | POA: Diagnosis not present

## 2020-06-19 DIAGNOSIS — M545 Low back pain, unspecified: Secondary | ICD-10-CM | POA: Diagnosis not present

## 2020-06-19 DIAGNOSIS — R69 Illness, unspecified: Secondary | ICD-10-CM | POA: Diagnosis not present

## 2020-06-19 DIAGNOSIS — Z6836 Body mass index (BMI) 36.0-36.9, adult: Secondary | ICD-10-CM | POA: Diagnosis not present

## 2020-07-05 DIAGNOSIS — I10 Essential (primary) hypertension: Secondary | ICD-10-CM | POA: Diagnosis not present

## 2020-07-05 DIAGNOSIS — Z6836 Body mass index (BMI) 36.0-36.9, adult: Secondary | ICD-10-CM | POA: Diagnosis not present

## 2020-07-05 DIAGNOSIS — Z299 Encounter for prophylactic measures, unspecified: Secondary | ICD-10-CM | POA: Diagnosis not present

## 2020-07-05 DIAGNOSIS — M545 Low back pain, unspecified: Secondary | ICD-10-CM | POA: Diagnosis not present

## 2020-07-05 DIAGNOSIS — G8929 Other chronic pain: Secondary | ICD-10-CM | POA: Diagnosis not present

## 2020-07-05 DIAGNOSIS — E1165 Type 2 diabetes mellitus with hyperglycemia: Secondary | ICD-10-CM | POA: Diagnosis not present

## 2020-07-19 DIAGNOSIS — G894 Chronic pain syndrome: Secondary | ICD-10-CM | POA: Diagnosis not present

## 2020-07-19 DIAGNOSIS — E1142 Type 2 diabetes mellitus with diabetic polyneuropathy: Secondary | ICD-10-CM | POA: Diagnosis not present

## 2020-07-19 DIAGNOSIS — R278 Other lack of coordination: Secondary | ICD-10-CM | POA: Diagnosis not present

## 2020-07-19 DIAGNOSIS — M961 Postlaminectomy syndrome, not elsewhere classified: Secondary | ICD-10-CM | POA: Diagnosis not present

## 2020-07-19 DIAGNOSIS — M25552 Pain in left hip: Secondary | ICD-10-CM | POA: Diagnosis not present

## 2020-07-19 DIAGNOSIS — R252 Cramp and spasm: Secondary | ICD-10-CM | POA: Diagnosis not present

## 2020-07-19 DIAGNOSIS — G4762 Sleep related leg cramps: Secondary | ICD-10-CM | POA: Diagnosis not present

## 2020-07-19 DIAGNOSIS — G20 Parkinson's disease: Secondary | ICD-10-CM | POA: Diagnosis not present

## 2020-07-19 DIAGNOSIS — G4700 Insomnia, unspecified: Secondary | ICD-10-CM | POA: Diagnosis not present

## 2020-07-19 DIAGNOSIS — M25551 Pain in right hip: Secondary | ICD-10-CM | POA: Diagnosis not present

## 2020-08-22 DIAGNOSIS — S0993XA Unspecified injury of face, initial encounter: Secondary | ICD-10-CM | POA: Diagnosis not present

## 2020-08-22 DIAGNOSIS — W19XXXA Unspecified fall, initial encounter: Secondary | ICD-10-CM | POA: Diagnosis not present

## 2020-08-24 ENCOUNTER — Ambulatory Visit: Payer: Medicare HMO

## 2020-08-24 ENCOUNTER — Other Ambulatory Visit: Payer: Self-pay

## 2020-08-24 ENCOUNTER — Ambulatory Visit: Payer: Medicare HMO | Admitting: Orthopaedic Surgery

## 2020-08-24 ENCOUNTER — Encounter: Payer: Self-pay | Admitting: Orthopaedic Surgery

## 2020-08-24 VITALS — BP 194/86 | HR 77 | Ht 64.0 in | Wt 178.0 lb

## 2020-08-24 DIAGNOSIS — G20 Parkinson's disease: Secondary | ICD-10-CM

## 2020-08-24 DIAGNOSIS — M25552 Pain in left hip: Secondary | ICD-10-CM

## 2020-08-24 DIAGNOSIS — G894 Chronic pain syndrome: Secondary | ICD-10-CM | POA: Diagnosis not present

## 2020-08-24 DIAGNOSIS — R69 Illness, unspecified: Secondary | ICD-10-CM | POA: Diagnosis not present

## 2020-08-24 DIAGNOSIS — M25551 Pain in right hip: Secondary | ICD-10-CM | POA: Diagnosis not present

## 2020-08-24 DIAGNOSIS — E1142 Type 2 diabetes mellitus with diabetic polyneuropathy: Secondary | ICD-10-CM | POA: Diagnosis not present

## 2020-08-24 DIAGNOSIS — W19XXXA Unspecified fall, initial encounter: Secondary | ICD-10-CM | POA: Diagnosis not present

## 2020-08-24 DIAGNOSIS — R531 Weakness: Secondary | ICD-10-CM | POA: Diagnosis not present

## 2020-08-24 DIAGNOSIS — R5381 Other malaise: Secondary | ICD-10-CM | POA: Diagnosis not present

## 2020-08-24 NOTE — Progress Notes (Signed)
Subjective:    Patient ID: Vanessa Stephens, female    DOB: 04/04/1950, 70 y.o.   MRN: [id number]  HPI She fell and hurt her left hip several days ago.  She has residual abrasions of the left face and left hand and wrist.  She has long history of chronic pain syndrome, chronic lower back pain and post back surgery, polyneuropathy secondary to diabetes and Parkinson's disease.  I have reviewed the notes from Dr. Merlene Laughter.  She has pain standing and moving the left hip and some on the right.  She has no redness, no numbness.   Review of Systems  Constitutional:  Positive for activity change.  Respiratory:  Positive for shortness of breath.   Cardiovascular:  Positive for palpitations.  Musculoskeletal:  Positive for arthralgias, back pain, gait problem and myalgias.  Psychiatric/Behavioral:  The patient is nervous/anxious.   All other systems reviewed and are negative. For Review of Systems, all other systems reviewed and are negative.  The following is a summary of the past history medically, past history surgically, known current medicines, social history and family history.  This information is gathered electronically by the computer from prior information and documentation.  I review this each visit and have found including this information at this point in the chart is beneficial and informative.   History reviewed. No pertinent past medical history.  History reviewed. No pertinent surgical history.  Current Outpatient Medications on File Prior to Visit  Medication Sig Dispense Refill   glipiZIDE (GLUCOTROL) 10 MG tablet Take 10 mg by mouth in the morning and at bedtime.     lisinopril-hydrochlorothiazide (ZESTORETIC) 20-12.5 MG tablet Take 1 tablet by mouth daily.     meloxicam (MOBIC) 15 MG tablet Take 1 tablet by mouth daily.     metFORMIN (GLUCOPHAGE) 500 MG tablet Take 1,000 mg by mouth in the morning and at bedtime.     metoprolol tartrate (LOPRESSOR) 50 MG tablet Take 50  mg by mouth daily.     oxyCODONE-acetaminophen (PERCOCET/ROXICET) 5-325 MG tablet Take 1 tablet by mouth 2 (two) times daily as needed for pain.     rosuvastatin (CRESTOR) 5 MG tablet Take 5 mg by mouth daily.     No current facility-administered medications on file prior to visit.    Social History   Socioeconomic History   Marital status: Married    Spouse name: Not on file   Number of children: Not on file   Years of education: Not on file   Highest education level: Not on file  Occupational History   Not on file  Tobacco Use   Smoking status: Former    Types: Cigarettes   Smokeless tobacco: Never  Substance and Sexual Activity   Alcohol use: Not on file   Drug use: Not on file   Sexual activity: Not on file  Other Topics Concern   Not on file  Social History Narrative   Not on file   Social Determinants of Health   Financial Resource Strain: Not on file  Food Insecurity: Not on file  Transportation Needs: Not on file  Physical Activity: Not on file  Stress: Not on file  Social Connections: Not on file  Intimate Partner Violence: Not on file    History reviewed. No pertinent family history.  BP (!) 194/86   Pulse 77   Ht 5\' 4"  (1.626 m)   Wt 178 lb (80.7 kg)   BMI 30.55 kg/m   Body mass index is  30.55 kg/m.     Objective:   Physical Exam Vitals and nursing note reviewed. Exam conducted with a chaperone present.  Constitutional:      Appearance: She is well-developed.  HENT:     Head: Normocephalic and atraumatic.  Eyes:     Conjunctiva/sclera: Conjunctivae normal.     Pupils: Pupils are equal, round, and reactive to light.  Cardiovascular:     Rate and Rhythm: Normal rate and regular rhythm.  Pulmonary:     Effort: Pulmonary effort is normal.  Abdominal:     Palpations: Abdomen is soft.  Musculoskeletal:     Cervical back: Normal range of motion and neck supple.       Legs:  Skin:    General: Skin is warm and dry.  Neurological:      Mental Status: She is alert and oriented to person, place, and time.     Cranial Nerves: No cranial nerve deficit.     Motor: No abnormal muscle tone.     Coordination: Coordination normal.     Deep Tendon Reflexes: Reflexes are normal and symmetric. Reflexes normal.  Psychiatric:        Behavior: Behavior normal.        Thought Content: Thought content normal.        Judgment: Judgment normal.  X-rays were done of the pelvis and hips, reported separately.  I am concerned of possible fracture of pubic area. I will get CT.        Assessment & Plan:   Encounter Diagnoses  Name Primary?   Bilateral hip pain Yes   Chronic pain syndrome    Parkinson disease ([place])    Type 2 diabetes mellitus with diabetic polyneuropathy, without long-term current use of insulin (HCC)    CT is ordered.  Return Tuesday.  Continue pain medicine she is on.  Use wheelchair, walker.  Limit walking.  Call if any problem.  Precautions discussed.  Electronically Signed Sanjuana Kava, MD 7/21/202211:42 AM

## 2020-08-25 ENCOUNTER — Ambulatory Visit (HOSPITAL_COMMUNITY)
Admission: RE | Admit: 2020-08-25 | Discharge: 2020-08-25 | Disposition: A | Discharge status: Home / Self Care | Payer: Medicare HMO | Source: Ambulatory Visit | Attending: Orthopaedic Surgery | Admitting: Orthopaedic Surgery

## 2020-08-25 DIAGNOSIS — M47816 Spondylosis without myelopathy or radiculopathy, lumbar region: Secondary | ICD-10-CM | POA: Diagnosis not present

## 2020-08-25 DIAGNOSIS — M25552 Pain in left hip: Secondary | ICD-10-CM

## 2020-08-25 DIAGNOSIS — M25551 Pain in right hip: Secondary | ICD-10-CM | POA: Diagnosis not present

## 2020-08-25 DIAGNOSIS — M160 Bilateral primary osteoarthritis of hip: Secondary | ICD-10-CM | POA: Diagnosis not present

## 2020-08-25 DIAGNOSIS — S329XXA Fracture of unspecified parts of lumbosacral spine and pelvis, initial encounter for closed fracture: Secondary | ICD-10-CM | POA: Diagnosis not present

## 2020-08-25 DIAGNOSIS — R296 Repeated falls: Secondary | ICD-10-CM | POA: Diagnosis not present

## 2020-08-31 ENCOUNTER — Ambulatory Visit (INDEPENDENT_AMBULATORY_CARE_PROVIDER_SITE_OTHER): Payer: Medicare HMO | Admitting: Orthopaedic Surgery

## 2020-08-31 ENCOUNTER — Encounter: Payer: Self-pay | Admitting: Orthopaedic Surgery

## 2020-08-31 ENCOUNTER — Other Ambulatory Visit: Payer: Self-pay

## 2020-08-31 VITALS — BP 195/97 | HR 87 | Ht 64.0 in | Wt 178.0 lb

## 2020-08-31 DIAGNOSIS — G894 Chronic pain syndrome: Secondary | ICD-10-CM

## 2020-08-31 DIAGNOSIS — G20A1 Parkinson's disease without dyskinesia, without mention of fluctuations: Secondary | ICD-10-CM

## 2020-08-31 DIAGNOSIS — E1142 Type 2 diabetes mellitus with diabetic polyneuropathy: Secondary | ICD-10-CM

## 2020-08-31 DIAGNOSIS — M25552 Pain in left hip: Secondary | ICD-10-CM

## 2020-08-31 DIAGNOSIS — G20 Parkinson's disease: Secondary | ICD-10-CM | POA: Diagnosis not present

## 2020-08-31 DIAGNOSIS — M25551 Pain in right hip: Secondary | ICD-10-CM | POA: Diagnosis not present

## 2020-08-31 NOTE — Progress Notes (Signed)
My hip hurts but is better.  Her CT of the pelvis did not reveal a fracture in the area I was concerned.  She has less pain of the hips.  She has difficulty in walking. She has long standing problems with her back and muscles and neuropathy.  NV intact.  She is in a wheelchair. ROM of the hips is good in the chair.    Encounter Diagnoses  Name Primary?   Bilateral hip pain Yes   Chronic pain syndrome    Parkinson disease ([place])    Type 2 diabetes mellitus with diabetic polyneuropathy, without long-term current use of insulin ([place])    I will do a virtual visit next time.  She should slowly get better.  Try to get up as much as she can.  Call if any problem.  Precautions discussed.  Electronically Signed Sanjuana Kava, MD 7/28/202210:42 AM

## 2020-08-31 NOTE — Patient Instructions (Signed)
Next appointment can be virtual.

## 2020-09-21 ENCOUNTER — Other Ambulatory Visit: Payer: Self-pay

## 2020-09-21 ENCOUNTER — Ambulatory Visit (INDEPENDENT_AMBULATORY_CARE_PROVIDER_SITE_OTHER): Payer: Medicare HMO | Admitting: Orthopaedic Surgery

## 2020-09-21 ENCOUNTER — Encounter: Payer: Self-pay | Admitting: Orthopaedic Surgery

## 2020-09-21 DIAGNOSIS — G20 Parkinson's disease: Secondary | ICD-10-CM

## 2020-09-21 DIAGNOSIS — G894 Chronic pain syndrome: Secondary | ICD-10-CM | POA: Diagnosis not present

## 2020-09-21 DIAGNOSIS — M25552 Pain in left hip: Secondary | ICD-10-CM | POA: Diagnosis not present

## 2020-09-21 DIAGNOSIS — E1142 Type 2 diabetes mellitus with diabetic polyneuropathy: Secondary | ICD-10-CM | POA: Diagnosis not present

## 2020-09-21 DIAGNOSIS — M25551 Pain in right hip: Secondary | ICD-10-CM

## 2020-09-21 NOTE — Progress Notes (Signed)
Virtual Visit via Telephone Note  I connected withNAME@ on 09/21/20 at 10:10 AM EDT by telephone and verified that I am speaking with the correct person using two identifiers.  Location: Patient: home Provider: office, [place]   I discussed the limitations, risks, security and privacy concerns of performing an evaluation and management service by telephone and the availability of in person appointments. I also discussed with the patient that there may be a patient responsible charge related to this service. The patient expressed understanding and agreed to proceed.   History of Present Illness: She is much improved.  She has no pain now.  She is using walker.  She is weak but overall much better.  She has no new trauma.   Observations/Objective: Per above.  Assessment and Plan: Encounter Diagnoses  Name Primary?   Bilateral hip pain Yes   Chronic pain syndrome    Parkinson disease ([place])    Type 2 diabetes mellitus with diabetic polyneuropathy, without long-term current use of insulin (HCC)      Follow Up Instructions: PRN   I discussed the assessment and treatment plan with the patient. The patient was provided an opportunity to ask questions and all were answered. The patient agreed with the plan and demonstrated an understanding of the instructions.   The patient was advised to call back or seek an in-person evaluation if the symptoms worsen or if the condition fails to improve as anticipated.  I provided 8 minutes of non-face-to-face time during this encounter.   Sanjuana Kava, MD

## 2020-09-24 ENCOUNTER — Other Ambulatory Visit: Payer: Self-pay

## 2020-09-24 ENCOUNTER — Emergency Department (HOSPITAL_COMMUNITY): Payer: Medicare HMO

## 2020-09-24 ENCOUNTER — Inpatient Hospital Stay (HOSPITAL_COMMUNITY)
Admission: EM | Admit: 2020-09-24 | Discharge: 2020-09-28 | DRG: 552 | Disposition: A | Discharge status: Skilled Nursing Facility | Payer: Medicare HMO | Attending: Family Medicine | Admitting: Family Medicine

## 2020-09-24 ENCOUNTER — Encounter (HOSPITAL_COMMUNITY): Payer: Self-pay

## 2020-09-24 DIAGNOSIS — K5900 Constipation, unspecified: Secondary | ICD-10-CM | POA: Diagnosis present

## 2020-09-24 DIAGNOSIS — M791 Myalgia, unspecified site: Secondary | ICD-10-CM | POA: Diagnosis not present

## 2020-09-24 DIAGNOSIS — G8929 Other chronic pain: Secondary | ICD-10-CM | POA: Diagnosis present

## 2020-09-24 DIAGNOSIS — T380X5A Adverse effect of glucocorticoids and synthetic analogues, initial encounter: Secondary | ICD-10-CM | POA: Diagnosis not present

## 2020-09-24 DIAGNOSIS — M549 Dorsalgia, unspecified: Secondary | ICD-10-CM | POA: Diagnosis not present

## 2020-09-24 DIAGNOSIS — I959 Hypotension, unspecified: Secondary | ICD-10-CM | POA: Diagnosis not present

## 2020-09-24 DIAGNOSIS — Z6830 Body mass index (BMI) 30.0-30.9, adult: Secondary | ICD-10-CM

## 2020-09-24 DIAGNOSIS — Z8249 Family history of ischemic heart disease and other diseases of the circulatory system: Secondary | ICD-10-CM

## 2020-09-24 DIAGNOSIS — E1165 Type 2 diabetes mellitus with hyperglycemia: Secondary | ICD-10-CM | POA: Diagnosis not present

## 2020-09-24 DIAGNOSIS — Z791 Long term (current) use of non-steroidal anti-inflammatories (NSAID): Secondary | ICD-10-CM

## 2020-09-24 DIAGNOSIS — R531 Weakness: Secondary | ICD-10-CM

## 2020-09-24 DIAGNOSIS — E785 Hyperlipidemia, unspecified: Secondary | ICD-10-CM | POA: Diagnosis present

## 2020-09-24 DIAGNOSIS — Z66 Do not resuscitate: Secondary | ICD-10-CM | POA: Diagnosis present

## 2020-09-24 DIAGNOSIS — R011 Cardiac murmur, unspecified: Secondary | ICD-10-CM | POA: Diagnosis present

## 2020-09-24 DIAGNOSIS — N390 Urinary tract infection, site not specified: Secondary | ICD-10-CM | POA: Diagnosis not present

## 2020-09-24 DIAGNOSIS — M4807 Spinal stenosis, lumbosacral region: Secondary | ICD-10-CM | POA: Diagnosis present

## 2020-09-24 DIAGNOSIS — B9620 Unspecified Escherichia coli [E. coli] as the cause of diseases classified elsewhere: Secondary | ICD-10-CM | POA: Diagnosis present

## 2020-09-24 DIAGNOSIS — Z853 Personal history of malignant neoplasm of breast: Secondary | ICD-10-CM | POA: Diagnosis not present

## 2020-09-24 DIAGNOSIS — M48061 Spinal stenosis, lumbar region without neurogenic claudication: Secondary | ICD-10-CM | POA: Diagnosis not present

## 2020-09-24 DIAGNOSIS — Z28310 Unvaccinated for covid-19: Secondary | ICD-10-CM

## 2020-09-24 DIAGNOSIS — I10 Essential (primary) hypertension: Secondary | ICD-10-CM | POA: Diagnosis not present

## 2020-09-24 DIAGNOSIS — Z743 Need for continuous supervision: Secondary | ICD-10-CM | POA: Diagnosis not present

## 2020-09-24 DIAGNOSIS — Z20822 Contact with and (suspected) exposure to covid-19: Secondary | ICD-10-CM | POA: Diagnosis present

## 2020-09-24 DIAGNOSIS — R32 Unspecified urinary incontinence: Secondary | ICD-10-CM | POA: Diagnosis present

## 2020-09-24 DIAGNOSIS — E1169 Type 2 diabetes mellitus with other specified complication: Secondary | ICD-10-CM | POA: Diagnosis present

## 2020-09-24 DIAGNOSIS — R0902 Hypoxemia: Secondary | ICD-10-CM | POA: Diagnosis not present

## 2020-09-24 DIAGNOSIS — R509 Fever, unspecified: Secondary | ICD-10-CM | POA: Diagnosis not present

## 2020-09-24 DIAGNOSIS — E119 Type 2 diabetes mellitus without complications: Secondary | ICD-10-CM | POA: Diagnosis not present

## 2020-09-24 DIAGNOSIS — Z79899 Other long term (current) drug therapy: Secondary | ICD-10-CM

## 2020-09-24 DIAGNOSIS — I2699 Other pulmonary embolism without acute cor pulmonale: Secondary | ICD-10-CM

## 2020-09-24 DIAGNOSIS — Z7984 Long term (current) use of oral hypoglycemic drugs: Secondary | ICD-10-CM

## 2020-09-24 DIAGNOSIS — Z885 Allergy status to narcotic agent status: Secondary | ICD-10-CM

## 2020-09-24 DIAGNOSIS — E669 Obesity, unspecified: Secondary | ICD-10-CM | POA: Diagnosis present

## 2020-09-24 DIAGNOSIS — Z87891 Personal history of nicotine dependence: Secondary | ICD-10-CM

## 2020-09-24 HISTORY — DX: Type 2 diabetes mellitus without complications: E11.9

## 2020-09-24 HISTORY — DX: Essential (primary) hypertension: I10

## 2020-09-24 HISTORY — DX: Malignant (primary) neoplasm, unspecified: C80.1

## 2020-09-24 LAB — COMPREHENSIVE METABOLIC PANEL
ALT: 27 U/L (ref 0–44)
AST: 32 U/L (ref 15–41)
Albumin: 3.6 g/dL (ref 3.5–5.0)
Alkaline Phosphatase: 69 U/L (ref 38–126)
Anion gap: 7 (ref 5–15)
BUN: 15 mg/dL (ref 8–23)
CO2: 25 mmol/L (ref 22–32)
Calcium: 8.9 mg/dL (ref 8.9–10.3)
Chloride: 104 mmol/L (ref 98–111)
Creatinine, Ser: 0.82 mg/dL (ref 0.44–1.00)
GFR, Estimated: >60 mL/min (ref >60)
Glucose, Bld: 174 mg/dL — ABNORMAL HIGH (ref 70–99)
Potassium: 3.9 mmol/L (ref 3.5–5.1)
Sodium: 136 mmol/L (ref 135–145)
Total Bilirubin: 0.8 mg/dL (ref 0.3–1.2)
Total Protein: 7.2 g/dL (ref 6.5–8.1)

## 2020-09-24 LAB — CBC WITH DIFFERENTIAL/PLATELET
Abs Immature Granulocytes: 0.02 10*3/uL (ref 0.00–0.07)
Basophils Absolute: 0 10*3/uL (ref 0.0–0.1)
Basophils Relative: 0 %
Eosinophils Absolute: 0 10*3/uL (ref 0.0–0.5)
Eosinophils Relative: 0 %
HCT: 41.3 % (ref 36.0–46.0)
Hemoglobin: 13.4 g/dL (ref 12.0–15.0)
Immature Granulocytes: 0 %
Lymphocytes Relative: 7 %
Lymphs Abs: 0.4 10*3/uL — ABNORMAL LOW (ref 0.7–4.0)
MCH: 27.1 pg (ref 26.0–34.0)
MCHC: 32.4 g/dL (ref 30.0–36.0)
MCV: 83.6 fL (ref 80.0–100.0)
Monocytes Absolute: 0.3 10*3/uL (ref 0.1–1.0)
Monocytes Relative: 5 %
Neutro Abs: 5.3 10*3/uL (ref 1.7–7.7)
Neutrophils Relative %: 88 %
Platelets: 195 10*3/uL (ref 150–400)
RBC: 4.94 MIL/uL (ref 3.87–5.11)
RDW: 13.5 % (ref 11.5–15.5)
WBC: 6.1 10*3/uL (ref 4.0–10.5)
nRBC: 0 % (ref 0.0–0.2)

## 2020-09-24 LAB — URINALYSIS, ROUTINE W REFLEX MICROSCOPIC
Bilirubin Urine: NEGATIVE
Glucose, UA: NEGATIVE mg/dL
Ketones, ur: 80 mg/dL — AB
Nitrite: POSITIVE — AB
Protein, ur: 100 mg/dL — AB
Specific Gravity, Urine: 1.02 (ref 1.005–1.030)
WBC, UA: >50 WBC/hpf — ABNORMAL HIGH (ref 0–5)
pH: 6 (ref 5.0–8.0)

## 2020-09-24 LAB — RESP PANEL BY RT-PCR (FLU A&B, COVID) ARPGX2
Influenza A by PCR: NEGATIVE
Influenza B by PCR: NEGATIVE
SARS Coronavirus 2 by RT PCR: NEGATIVE

## 2020-09-24 LAB — APTT: aPTT: 27 seconds (ref 24–36)

## 2020-09-24 LAB — CBG MONITORING, ED
Glucose-Capillary: 165 mg/dL — ABNORMAL HIGH (ref 70–99)
Glucose-Capillary: 99 mg/dL (ref 70–99)

## 2020-09-24 LAB — PROTIME-INR
INR: 1.1 (ref 0.8–1.2)
Prothrombin Time: 14.2 seconds (ref 11.4–15.2)

## 2020-09-24 LAB — LACTIC ACID, PLASMA: Lactic Acid, Venous: 0.5 mmol/L (ref 0.5–1.9)

## 2020-09-24 MED ORDER — SODIUM CHLORIDE 0.9 % IV SOLN
1.0000 g | INTRAVENOUS | Status: DC
  Administered 2020-09-25 – 2020-09-26 (×2): 1 g via INTRAVENOUS
  Filled 2020-09-24 (×2): qty 10

## 2020-09-24 MED ORDER — MORPHINE SULFATE (PF) 2 MG/ML IV SOLN
2.0000 mg | INTRAVENOUS | Status: DC | PRN
Start: 2020-09-24 — End: 2020-09-25

## 2020-09-24 MED ORDER — ACETAMINOPHEN 325 MG PO TABS
650.0000 mg | ORAL_TABLET | Freq: Four times a day (QID) | ORAL | Status: DC | PRN
  Administered 2020-09-25 – 2020-09-28 (×8): 650 mg via ORAL
  Filled 2020-09-24 (×9): qty 2

## 2020-09-24 MED ORDER — ACETAMINOPHEN 650 MG RE SUPP: 650.0000 mg | Freq: Four times a day (QID) | RECTAL | Status: DC | PRN

## 2020-09-24 MED ORDER — ROSUVASTATIN CALCIUM 10 MG PO TABS
5.0000 mg | ORAL_TABLET | Freq: Every day | ORAL | Status: DC
  Administered 2020-09-25 – 2020-09-27 (×3): 5 mg via ORAL
  Filled 2020-09-24 (×3): qty 1

## 2020-09-24 MED ORDER — METOPROLOL TARTRATE 50 MG PO TABS
50.0000 mg | ORAL_TABLET | Freq: Every day | ORAL | Status: DC
  Administered 2020-09-25 – 2020-09-28 (×4): 50 mg via ORAL
  Filled 2020-09-24 (×4): qty 1

## 2020-09-24 MED ORDER — DEXAMETHASONE SODIUM PHOSPHATE 10 MG/ML IJ SOLN
10.0000 mg | Freq: Once | INTRAMUSCULAR | Status: AC
  Administered 2020-09-24: 10 mg via INTRAVENOUS
  Filled 2020-09-24: qty 1

## 2020-09-24 MED ORDER — OXYCODONE-ACETAMINOPHEN 5-325 MG PO TABS: 1.0000 | ORAL_TABLET | Freq: Two times a day (BID) | ORAL | Status: DC | PRN

## 2020-09-24 MED ORDER — ACETAMINOPHEN 500 MG PO TABS
1000.0000 mg | ORAL_TABLET | Freq: Once | ORAL | Status: AC
  Administered 2020-09-24: 1000 mg via ORAL
  Filled 2020-09-24: qty 2

## 2020-09-24 MED ORDER — LISINOPRIL-HYDROCHLOROTHIAZIDE 20-12.5 MG PO TABS: 1.0000 | ORAL_TABLET | Freq: Every day | ORAL | Status: DC

## 2020-09-24 MED ORDER — INSULIN ASPART 100 UNIT/ML IJ SOLN
0.0000 [IU] | Freq: Every day | INTRAMUSCULAR | Status: DC
  Administered 2020-09-26: 3 [IU] via SUBCUTANEOUS
  Filled 2020-09-24: qty 1

## 2020-09-24 MED ORDER — ONDANSETRON HCL 4 MG PO TABS: 4.0000 mg | ORAL_TABLET | Freq: Four times a day (QID) | ORAL | Status: DC | PRN

## 2020-09-24 MED ORDER — METHOCARBAMOL 1000 MG/10ML IJ SOLN
500.0000 mg | Freq: Four times a day (QID) | INTRAVENOUS | Status: DC | PRN
  Filled 2020-09-24: qty 5

## 2020-09-24 MED ORDER — ONDANSETRON HCL 4 MG/2ML IJ SOLN
4.0000 mg | Freq: Four times a day (QID) | INTRAMUSCULAR | Status: DC | PRN
  Administered 2020-09-26: 4 mg via INTRAVENOUS
  Filled 2020-09-24: qty 2

## 2020-09-24 MED ORDER — IOHEXOL 350 MG/ML SOLN
75.0000 mL | Freq: Once | INTRAVENOUS | Status: AC | PRN
  Administered 2020-09-24: 75 mL via INTRAVENOUS

## 2020-09-24 MED ORDER — FENTANYL CITRATE PF 50 MCG/ML IJ SOSY
100.0000 ug | PREFILLED_SYRINGE | Freq: Once | INTRAMUSCULAR | Status: AC
  Administered 2020-09-24: 100 ug via INTRAVENOUS
  Filled 2020-09-24: qty 2

## 2020-09-24 MED ORDER — SODIUM CHLORIDE 0.9 % IV SOLN
1.0000 g | Freq: Once | INTRAVENOUS | Status: AC
  Administered 2020-09-24: 1 g via INTRAVENOUS
  Filled 2020-09-24: qty 10

## 2020-09-24 MED ORDER — HYDROCHLOROTHIAZIDE 12.5 MG PO CAPS
12.5000 mg | ORAL_CAPSULE | Freq: Every day | ORAL | Status: DC
  Administered 2020-09-25 – 2020-09-28 (×4): 12.5 mg via ORAL
  Filled 2020-09-24 (×4): qty 1

## 2020-09-24 MED ORDER — HEPARIN SODIUM (PORCINE) 5000 UNIT/ML IJ SOLN
5000.0000 [IU] | Freq: Three times a day (TID) | INTRAMUSCULAR | Status: DC
  Administered 2020-09-24 – 2020-09-28 (×12): 5000 [IU] via SUBCUTANEOUS
  Filled 2020-09-24 (×12): qty 1

## 2020-09-24 MED ORDER — INSULIN ASPART 100 UNIT/ML IJ SOLN
0.0000 [IU] | Freq: Three times a day (TID) | INTRAMUSCULAR | Status: DC
  Administered 2020-09-25: 2 [IU] via SUBCUTANEOUS
  Administered 2020-09-25: 5 [IU] via SUBCUTANEOUS
  Administered 2020-09-25: 2 [IU] via SUBCUTANEOUS
  Administered 2020-09-26: 3 [IU] via SUBCUTANEOUS
  Administered 2020-09-26 – 2020-09-27 (×2): 2 [IU] via SUBCUTANEOUS
  Administered 2020-09-27: 3 [IU] via SUBCUTANEOUS
  Administered 2020-09-27: 8 [IU] via SUBCUTANEOUS
  Administered 2020-09-28: 3 [IU] via SUBCUTANEOUS
  Filled 2020-09-24 (×2): qty 1

## 2020-09-24 MED ORDER — LISINOPRIL 10 MG PO TABS
20.0000 mg | ORAL_TABLET | Freq: Every day | ORAL | Status: DC
  Administered 2020-09-25 – 2020-09-28 (×4): 20 mg via ORAL
  Filled 2020-09-24 (×4): qty 2

## 2020-09-24 NOTE — ED Provider Notes (Signed)
[facility] EMERGENCY DEPARTMENT Provider Note   CSN: [id number] Arrival date & time: 09/24/20  1538     History Chief Complaint  Patient presents with   Weakness    Vanessa Stephens is a 70 y.o. female.  HPI 70 year old female presents with leg weakness.  She has been having progressive leg weakness for about 3 weeks.  Started after she fell and had a questionable pelvic fracture that turned out to be negative on a CT scan.  However she is also been having back pain and never had this imaged.  Back pain is progressively worse in her lumbar back.  Sometimes it radiates down her legs, she cannot remember which one.  She has chronic numbness to her legs is not worse.  However the leg weakness seems to be worse bilaterally.  She felt like she was doing okay when she went to bed at 1:30 AM but when she got up to go urinate she noted that she was weak enough to the point that it was so slow that she ended up urinating on herself before she got to the bathroom.  She is been having issues with urinary incontinence for several weeks, prior to the fall.  She denies dysuria.  No saddle anesthesia or bowel incontinence.  Pain right now is 7 out of 10 in her back.  No upper extremity weakness or headache.  Past Medical History:  Diagnosis Date   Cancer ([place])    Diabetes mellitus without complication ([place])    Hypertension     Patient Active Problem List   Diagnosis Date Noted   Generalized weakness 09/24/2020   Heart murmur 09/24/2020   Hyperlipidemia 09/24/2020   Diabetes mellitus type 2 in obese [facility]) 09/24/2020   Acute lower UTI 09/24/2020    Past Surgical History:  Procedure Laterality Date   BACK SURGERY     BREAST LUMPECTOMY     CHOLECYSTECTOMY       OB History   No obstetric history on file.     History reviewed. No pertinent family history.  Social History   Tobacco Use   Smoking status: Former    Types: Cigarettes   Smokeless tobacco: Never  Vaping Use   Vaping  Use: Never used  Substance Use Topics   Alcohol use: Not Currently   Drug use: Never    Home Medications Prior to Admission medications   Medication Sig Start Date End Date Taking? Authorizing Provider  DULoxetine (CYMBALTA) 60 MG capsule Take 60 mg by mouth daily.   Yes [provider]  glipiZIDE (GLUCOTROL) 10 MG tablet Take 10 mg by mouth in the morning and at bedtime.   Yes [provider]  lisinopril-hydrochlorothiazide (ZESTORETIC) 20-12.5 MG tablet Take 1 tablet by mouth daily.   Yes [provider]  meloxicam (MOBIC) 15 MG tablet Take 1 tablet by mouth daily. 07/20/20  Yes [provider]  metFORMIN (GLUCOPHAGE) 500 MG tablet Take 1,000 mg by mouth in the morning and at bedtime.   Yes [provider]  metoprolol tartrate (LOPRESSOR) 50 MG tablet Take 50 mg by mouth daily.   Yes [provider]  OVER THE COUNTER MEDICATION Take 1 tablet by mouth in the morning and at bedtime. Homeopathic pain medication   Yes [provider]  oxyCODONE-acetaminophen (PERCOCET/ROXICET) 5-325 MG tablet Take 1 tablet by mouth 2 (two) times daily as needed for pain. 07/20/20  Yes [provider]  rosuvastatin (CRESTOR) 5 MG tablet Take 5 mg by  mouth daily.   Yes [provider]    Allergies    Codeine  Review of Systems   Review of Systems  Constitutional:  Negative for fever.  Respiratory:  Negative for cough.   Cardiovascular:  Negative for chest pain.  Gastrointestinal:  Positive for diarrhea (chronic). Negative for abdominal pain and vomiting.  Genitourinary:  Negative for dysuria.  Musculoskeletal:  Positive for back pain.  Neurological:  Positive for weakness and numbness. Negative for headaches.  All other systems reviewed and are negative.  Physical Exam Updated Vital Signs BP (!) 170/77   Pulse 75   Temp 98 F (36.7 C)   Resp 16   Ht '5\' 4"'$  (1.626 m)   Wt 80.7 kg   SpO2 96%   BMI 30.55 kg/m    Physical Exam Vitals and nursing note reviewed.  Constitutional:      General: She is not in acute distress.    Appearance: She is well-developed. She is obese. She is not ill-appearing or diaphoretic.  HENT:     Head: Normocephalic and atraumatic.     Right Ear: External ear normal.     Left Ear: External ear normal.     Nose: Nose normal.  Eyes:     General:        Right eye: No discharge.        Left eye: No discharge.     Extraocular Movements: Extraocular movements intact.     Pupils: Pupils are equal, round, and reactive to light.  Cardiovascular:     Rate and Rhythm: Normal rate and regular rhythm.     Pulses:          Dorsalis pedis pulses are 2+ on the right side and 2+ on the left side.     Heart sounds: Murmur heard.  Pulmonary:     Effort: Pulmonary effort is normal.     Breath sounds: Normal breath sounds.  Abdominal:     Palpations: Abdomen is soft.     Tenderness: There is no abdominal tenderness.  Musculoskeletal:     Lumbar back: Tenderness present.       Back:  Skin:    General: Skin is warm and dry.  Neurological:     Mental Status: She is alert.     Comments: 5/5 strength in BUE. Patient lifts both legs only a few inches off stretcher, but can hold them up against resistance bilaterally. Grossly normal sensation  Psychiatric:        Mood and Affect: Mood is not anxious.    ED Results / Procedures / Treatments   Labs (all labs ordered are listed, but only abnormal results are displayed) Labs Reviewed  COMPREHENSIVE METABOLIC PANEL - Abnormal; Notable for the following components:      Result Value   Glucose, Bld 174 (*)    All other components within normal limits  CBC WITH DIFFERENTIAL/PLATELET - Abnormal; Notable for the following components:   Lymphs Abs 0.4 (*)    All other components within normal limits  URINALYSIS, ROUTINE W REFLEX MICROSCOPIC - Abnormal; Notable for the following components:   APPearance HAZY (*)    Hgb urine  dipstick SMALL (*)    Ketones, ur 80 (*)    Protein, ur 100 (*)    Nitrite POSITIVE (*)    Leukocytes,Ua LARGE (*)    WBC, UA >50 (*)    Bacteria, UA RARE (*)    All other components within normal limits  CBG MONITORING,  ED - Abnormal; Notable for the following components:   Glucose-Capillary 165 (*)    All other components within normal limits  RESP PANEL BY RT-PCR (FLU A&B, COVID) ARPGX2  CULTURE, BLOOD (ROUTINE X 2)  CULTURE, BLOOD (ROUTINE X 2)  URINE CULTURE  LACTIC ACID, PLASMA  PROTIME-INR  APTT  HEMOGLOBIN A1C  COMPREHENSIVE METABOLIC PANEL  MAGNESIUM  CBC WITH DIFFERENTIAL/PLATELET  TSH  CBG MONITORING, ED    EKG EKG Interpretation  Date/Time:  Sunday September 24 2020 17:00:36 EDT Ventricular Rate:  93 PR Interval:  172 QRS Duration: 86 QT Interval:  347 QTC Calculation: 432 R Axis:   24 Text Interpretation: Sinus rhythm Atrial premature complexes Consider anterior infarct Confirmed by Sherwood Gambler [phone]) on 09/24/2020 5:30:04 PM  Radiology CT LUMBAR SPINE W CONTRAST  Result Date: 09/24/2020 CLINICAL DATA:  Clinical suspicion for infection. Lower back pain vs infection suspected. pt fell about three weeks ago and was first told that she cracked her pelvis, but then was told that it wasn't broken, states that she is here for weakness in her lower extremities with some chronic back pain prior hx of surgeries last one in 2006 per patient. Hx of breast cancer, DM, HTN. EXAM: CT LUMBAR SPINE WITH CONTRAST TECHNIQUE: Multidetector CT imaging of the lumbar spine was performed with intravenous contrast administration. CONTRAST:  63m OMNIPAQUE IOHEXOL 350 MG/ML SOLN COMPARISON:  Lumbar MRI, 07/15/2019. FINDINGS: Segmentation: 5 lumbar type vertebrae. Alignment: No spondylolisthesis. Minor curvature, upper lumbar spine convex the left and lower lumbar spine convex to the right. Vertebrae: No fracture or bone lesion. Paraspinal and other soft tissues: No paraspinal mass,  inflammation or fluid collection. Disc levels: Posterior lumbar spine fusion, L4, L5 and S1, with well-positioned bilateral pedicle screws and intact interconnecting rods. Mature appearing bony fusion across the posterior elements associated with the orthopedic hardware. T12-L1: Mild loss of disc height. Endplate spurring anteriorly. No significant posterior disc bulging. No disc herniation. Mild facet degenerative change. No significant stenosis. L1-L2: Moderate loss of disc height. Disc bulging and endplate spurring. Mild to moderate bilateral facet degenerative change. Central spinal canal narrowed, 7 mm anterior to posterior. No convincing disc herniation. Moderate right neural foraminal narrowing. L2-L3: Mild to moderate loss of disc height. Mild diffuse disc bulging. Mild facet degenerative change. Mild central stenosis. Moderate bilateral neural foraminal narrowing. No convincing disc herniation. L3-L4: Marked loss of disc height. Endplate sclerosis, bulging and osteophytes. Moderate bilateral facet degenerative change. Mild central stenosis. Mild left neural foraminal narrowing. No convincing disc herniation. L4-L5 spinal canal contents not well evaluated. Spinal canal appears widely patent. No convincing mass or fluid collection. Mild left neural foraminal narrowing. L5-S1: Spinal canal contents not well visualized due to artifact from the orthopedic hardware. Spinal canal appears widely patent. No convincing fluid collection. No significant stenosis. IMPRESSION: 1. No fracture or acute finding. No abnormal enhancement or fluid collection or inflammatory change to suggest infection. No abnormalities the disc spaces to suggest discitis/osteomyelitis. 2. Status post posterior fusion, L4 through S1. Orthopedic hardware is well-seated. There is mature bone graft material along the posterior elements. 3. Degenerative changes throughout the visualized spine as detailed. No convincing disc herniation.  Electronically Signed   By: DLajean ManesM.D.   On: 09/24/2020 18:12   DG Chest Port 1 View  Result Date: 09/24/2020 CLINICAL DATA:  Fever and weakness.  Fell 3 weeks ago. EXAM: PORTABLE CHEST 1 VIEW COMPARISON:  08/22/2004 FINDINGS: Normal heart size and pulmonary vascularity. No focal airspace  disease or consolidation in the lungs. No blunting of costophrenic angles. No pneumothorax. Mediastinal contours appear intact. Calcification of the aorta. Calcifications over the left chest likely in the soft tissues of the breast or axilla. Surgical clips in the left axilla. Degenerative changes in the spine and shoulders. IMPRESSION: No active disease. Electronically Signed   By: Lucienne Capers M.D.   On: 09/24/2020 17:45    Procedures Procedures   Medications Ordered in ED Medications  cefTRIAXone (ROCEPHIN) 1 g in sodium chloride 0.9 % 100 mL IVPB (has no administration in time range)  insulin aspart (novoLOG) injection 0-15 Units (has no administration in time range)  insulin aspart (novoLOG) injection 0-5 Units (0 Units Subcutaneous Not Given 09/24/20 2304)  oxyCODONE-acetaminophen (PERCOCET/ROXICET) 5-325 MG per tablet 1 tablet (has no administration in time range)  metoprolol tartrate (LOPRESSOR) tablet 50 mg (50 mg Oral Not Given 09/24/20 2120)  rosuvastatin (CRESTOR) tablet 5 mg (5 mg Oral Not Given 09/24/20 2120)  heparin injection 5,000 Units (5,000 Units Subcutaneous Given 09/24/20 2257)  acetaminophen (TYLENOL) tablet 650 mg (has no administration in time range)    Or  acetaminophen (TYLENOL) suppository 650 mg (has no administration in time range)  morphine 2 MG/ML injection 2 mg (has no administration in time range)  methocarbamol (ROBAXIN) 500 mg in dextrose 5 % 50 mL IVPB (has no administration in time range)  ondansetron (ZOFRAN) tablet 4 mg (has no administration in time range)    Or  ondansetron (ZOFRAN) injection 4 mg (has no administration in time range)  lisinopril (ZESTRIL)  tablet 20 mg (has no administration in time range)    And  hydrochlorothiazide (MICROZIDE) capsule 12.5 mg (has no administration in time range)  fentaNYL (SUBLIMAZE) injection 100 mcg (100 mcg Intravenous Given 09/24/20 1643)  acetaminophen (TYLENOL) tablet 1,000 mg (1,000 mg Oral Given 09/24/20 1645)  iohexol (OMNIPAQUE) 350 MG/ML injection 75 mL (75 mLs Intravenous Contrast Given 09/24/20 1727)  cefTRIAXone (ROCEPHIN) 1 g in sodium chloride 0.9 % 100 mL IVPB (0 g Intravenous Stopped 09/24/20 1934)  dexamethasone (DECADRON) injection 10 mg (10 mg Intravenous Given 09/24/20 2257)    ED Course  I have reviewed the triage vital signs and the nursing notes.  Pertinent labs & imaging results that were available during my care of the patient were reviewed by me and considered in my medical decision making (see chart for details).  Clinical Course as of 09/24/20 2332  Nancy Fetter Sep 24, 2020  1633 Patient is found to have a fever. Will add on sepsis workup, will need back imaging.  [SG]    Clinical Course User Index [SG] Sherwood Gambler, MD   MDM Rules/Calculators/A&P                           Patient was found to have a fever here and it seems to be coming from a urinary tract infection.  Given her progressive leg weakness and back pain a CT was obtained as MRI is not emergently available and did not show an obvious fracture or infection.  However she is having progressive leg weakness for weeks, and on further talking it seems like this preceded the fall.  She is not safe to go home at this point.  Thus the hospitalist was consulted and she will get IV antibiotics and need admission. Final Clinical Impression(s) / ED Diagnoses Final diagnoses:  Febrile urinary tract infection    Rx / DC Orders ED Discharge  Orders     None        Sherwood Gambler, MD 09/24/20 [id number]

## 2020-09-24 NOTE — ED Triage Notes (Signed)
Pt to er via ems, per ems pt fell about three weeks ago and was first told that she cracked her pelvis, but then was told that it wasn't broken, states that she is here for weakness in her lower extremities with some chronic back pain.

## 2020-09-24 NOTE — ED Notes (Signed)
Pt to CT

## 2020-09-24 NOTE — H&P (Signed)
TRH H&P    Patient Demographics:    Vanessa Stephens, is a 69 y.o. female  MRN: [id number]  DOB - Nov 20, 1950  Admit Date - 09/24/2020  Referring MD/NP/PA: Regenia Skeeter  Outpatient Primary MD for the patient is Glenda Chroman, MD  Patient coming from: Home  Chief complaint- Progressive BL lower extremity weakness   HPI:    Vanessa Stephens  is a 70 y.o. female, with history of DMII, HTN HLD, and more presents to the ED with a chief complaint of bilateral lower extremity weakness.  Patient reports that this started a month ago but has been progressively worse since it started.  This morning she could not even stand up.  She reports that it is normal for her to walk with a Rollator at baseline.  Lately her legs have felt so weak she felt unsafe even with a Rollator.  She reports she feels unsafe in the shower as well even with the stool to sit on in the shower.  The shower has 1 step that she has to lift her leg over, and she feels very unsteady with that.  She reports today she could not get out of bed without an assist.  This is new for her.  The husband had to come and get her out of bed to walker to the bathroom.  Patient reports that she has been totally incontinent of urine for 1 month.  She has a sensation that she is voiding, but cannot control it.  She has not had any loss of control of her bowels.  She denies any saddle anesthesia.  She does have back pain that feels like a dull ache going across the lower back from hip to hip.  She denies any sharp pains.  She had a fever when she got to the ER today, but does not think that she had a fever at home.  She has not had any dysuria, hematuria.  She wants to get back to the point where she can get herself out of bed, walked to the bathroom, and take a shower by herself.  She reports that she was able to do that 1 month ago.  She does report that she has had back pain since her  back surgery in 2006.  She is also had fatigue since her back surgery in 2006.  She has no other complaints at this time.   Patient does not smoke, does not drink regularly, does not use illicit drugs.  She is not not vaccinated for COVID.  She is DNR  In the ED Temperature 101.1, after Tylenol 99.8, respiratory rate 16, blood pressure 160/70, satting 95% on room air, heart rate 85-97 UA is indicative of UTI, urine culture pending CT lumbar spine with contrast shows no fracture or acute finding.  Status post fusion of L4-S1.  Degenerative disc disease -without convincing herniation. Blood culture pending White blood cell count 6.1, hemoglobin 13.4 Chemistry panel is unremarkable Negative respiratory panel Patient is unsafe to go home, does have a fever here, is interested in rehab,  so admission was requested to monitor and help set her up with rehab.    Review of systems:    In addition to the HPI above,  No Fever-chills, No Headache, No changes with Vision or hearing, No problems swallowing food or Liquids, No Chest pain, Cough or Shortness of Breath, No Abdominal pain, No Nausea or Vomiting, bowel movements are regular, No Blood in stool or Urine, No dysuria, No new skin rashes or bruises, No new joints pains-aches,  Bilateral lower extremity weakness without numbness No recent weight gain or loss, No polyuria, polydypsia or polyphagia, No significant Mental Stressors.  All other systems reviewed and are negative.    Past History of the following :    Past Medical History:  Diagnosis Date   Cancer ([place])    Diabetes mellitus without complication ([place])    Hypertension       Past Surgical History:  Procedure Laterality Date   BACK SURGERY     BREAST LUMPECTOMY     CHOLECYSTECTOMY        Social History:      Social History   Tobacco Use   Smoking status: Former    Types: Cigarettes   Smokeless tobacco: Never  Substance Use Topics   Alcohol use: Not  Currently       Family History :    History reviewed. No pertinent family history. Family history hypertension   Home Medications:   Prior to Admission medications   Medication Sig Start Date End Date Taking? Authorizing Provider  glipiZIDE (GLUCOTROL) 10 MG tablet Take 10 mg by mouth in the morning and at bedtime.    [provider]  lisinopril-hydrochlorothiazide (ZESTORETIC) 20-12.5 MG tablet Take 1 tablet by mouth daily.    [provider]  meloxicam (MOBIC) 15 MG tablet Take 1 tablet by mouth daily. 07/20/20   [provider]  metFORMIN (GLUCOPHAGE) 500 MG tablet Take 1,000 mg by mouth in the morning and at bedtime.    [provider]  metoprolol tartrate (LOPRESSOR) 50 MG tablet Take 50 mg by mouth daily.    [provider]  oxyCODONE-acetaminophen (PERCOCET/ROXICET) 5-325 MG tablet Take 1 tablet by mouth 2 (two) times daily as needed for pain. 07/20/20   [provider]  rosuvastatin (CRESTOR) 5 MG tablet Take 5 mg by mouth daily.    [provider]     Allergies:     Allergies  Allergen Reactions   Codeine Nausea And Vomiting     Physical Exam:   Vitals  Blood pressure (!) 166/79, pulse 75, temperature 98 F (36.7 C), resp. rate 17, height '5\' 4"'$  (1.626 m), weight 80.7 kg, SpO2 95 %.  1.  General: Patient lying supine in bed,  no acute distress   2. Psychiatric: Alert and oriented x 3, mood and behavior normal for situation, pleasant and cooperative with exam   3. Neurologic: Speech and language are normal, face is symmetric, moves all 4 extremities voluntarily, 4 out of 5 strength in the right lower extremity, 3 out of 5 strength in the left lower extremity, sensation intact, no saddle anesthesia   4. HEENMT:  Head is atraumatic, normocephalic, pupils reactive to light, neck is supple, trachea is midline, mucous membranes are moist   5. Respiratory : Lungs are clear to auscultation bilaterally  without wheezing, rhonchi, rales, no cyanosis, no increase in work of breathing or accessory muscle use   6. Cardiovascular : Heart rate normal, rhythm is regular, systolic murmur present,  rubs or gallops, no peripheral edema, peripheral pulses palpated   7. Gastrointestinal:  Abdomen is soft, nondistended, nontender to palpation bowel sounds active, no masses or organomegaly palpated   8. Skin:  Skin is warm, dry and intact without rashes, acute lesions, or ulcers on limited exam   9.Musculoskeletal:  No acute deformities or trauma, no asymmetry in tone, no peripheral edema, peripheral pulses palpated, no tenderness to palpation in the extremities     Data Review:    CBC Recent Labs  Lab 09/24/20 1610  WBC 6.1  HGB 13.4  HCT 41.3  PLT 195  MCV 83.6  MCH 27.1  MCHC 32.4  RDW 13.5  LYMPHSABS 0.4*  MONOABS 0.3  EOSABS 0.0  BASOSABS 0.0   ------------------------------------------------------------------------------------------------------------------  Results for orders placed or performed during the hospital encounter of 09/24/20 (from the past 48 hour(s))  Comprehensive metabolic panel     Status: Abnormal   Collection Time: 09/24/20  4:10 PM  Result Value Ref Range   Sodium 136 135 - 145 mmol/L   Potassium 3.9 3.5 - 5.1 mmol/L   Chloride 104 98 - 111 mmol/L   CO2 25 22 - 32 mmol/L   Glucose, Bld 174 (H) 70 - 99 mg/dL    Comment: Glucose reference range applies only to samples taken after fasting for at least 8 hours.   BUN 15 8 - 23 mg/dL   Creatinine, Ser 0.82 0.44 - 1.00 mg/dL   Calcium 8.9 8.9 - 10.3 mg/dL   Total Protein 7.2 6.5 - 8.1 g/dL   Albumin 3.6 3.5 - 5.0 g/dL   AST 32 15 - 41 U/L   ALT 27 0 - 44 U/L   Alkaline Phosphatase 69 38 - 126 U/L   Total Bilirubin 0.8 0.3 - 1.2 mg/dL   GFR, Estimated >60 >60 mL/min    Comment: (NOTE) Calculated using the CKD-EPI Creatinine Equation (2021)    Anion gap 7 5 - 15    Comment: Performed at [facility], [address], [place], [place] [address]  CBC with Differential     Status: Abnormal   Collection Time: 09/24/20  4:10 PM  Result Value Ref Range   WBC 6.1 4.0 - 10.5 K/uL   RBC 4.94 3.87 - 5.11 MIL/uL   Hemoglobin 13.4 12.0 - 15.0 g/dL   HCT 41.3 36.0 - 46.0 %   MCV 83.6 80.0 - 100.0 fL   MCH 27.1 26.0 - 34.0 pg   MCHC 32.4 30.0 - 36.0 g/dL   RDW 13.5 11.5 - 15.5 %   Platelets 195 150 - 400 K/uL   nRBC 0.0 0.0 - 0.2 %   Neutrophils Relative % 88 %   Neutro Abs 5.3 1.7 - 7.7 K/uL   Lymphocytes Relative 7 %   Lymphs Abs 0.4 (L) 0.7 - 4.0 K/uL   Monocytes Relative 5 %   Monocytes Absolute 0.3 0.1 - 1.0 K/uL   Eosinophils Relative 0 %   Eosinophils Absolute 0.0 0.0 - 0.5 K/uL   Basophils Relative 0 %   Basophils Absolute 0.0 0.0 - 0.1 K/uL   Immature Granulocytes 0 %   Abs Immature Granulocytes 0.02 0.00 - 0.07 K/uL    Comment: Performed at [facility], [address] [place], [place] [address]  Resp Panel by RT-PCR (Flu A&B, Covid) Nasopharyngeal Swab     Status: None   Collection Time: 09/24/20  4:27 PM   Specimen: Nasopharyngeal Swab; Nasopharyngeal(NP) swabs in vial transport medium  Result  Value Ref Range   SARS Coronavirus 2 by RT PCR NEGATIVE NEGATIVE    Comment: (NOTE) SARS-CoV-2 target nucleic acids are NOT DETECTED.  The SARS-CoV-2 RNA is generally detectable in upper respiratory specimens during the acute phase of infection. The lowest concentration of SARS-CoV-2 viral copies this assay can detect is 138 copies/mL. A negative result does not preclude SARS-Cov-2 infection and should not be used as the sole basis for treatment or other patient management decisions. A negative result may occur with  improper specimen collection/handling, submission of specimen other than nasopharyngeal swab, presence of viral mutation(s) within the areas targeted by this assay, and inadequate number of viral copies(<138 copies/mL). A negative result must be combined  with clinical observations, patient history, and epidemiological information. The expected result is Negative.  Fact Sheet for Patients:  EntrepreneurPulse.com.au  Fact Sheet for Healthcare Providers:  IncredibleEmployment.be  This test is no t yet approved or cleared by the [place] FDA and  has been authorized for detection and/or diagnosis of SARS-CoV-2 by FDA under an Emergency Use Authorization (EUA). This EUA will remain  in effect (meaning this test can be used) for the duration of the COVID-19 declaration under Section 564(b)(1) of the Act, 21 U.S.C.section 360bbb-3(b)(1), unless the authorization is terminated  or revoked sooner.       Influenza A by PCR NEGATIVE NEGATIVE   Influenza B by PCR NEGATIVE NEGATIVE    Comment: (NOTE) The Xpert Xpress SARS-CoV-2/FLU/RSV plus assay is intended as an aid in the diagnosis of influenza from Nasopharyngeal swab specimens and should not be used as a sole basis for treatment. Nasal washings and aspirates are unacceptable for Xpert Xpress SARS-CoV-2/FLU/RSV testing.  Fact Sheet for Patients: EntrepreneurPulse.com.au  Fact Sheet for Healthcare Providers: IncredibleEmployment.be  This test is not yet approved or cleared by the [place] FDA and has been authorized for detection and/or diagnosis of SARS-CoV-2 by FDA under an Emergency Use Authorization (EUA). This EUA will remain in effect (meaning this test can be used) for the duration of the COVID-19 declaration under Section 564(b)(1) of the Act, 21 U.S.C. section 360bbb-3(b)(1), unless the authorization is terminated or revoked.  Performed at [facility], [address] [place], [place] [address]   CBG monitoring, ED     Status: Abnormal   Collection Time: 09/24/20  4:36 PM  Result Value Ref Range   Glucose-Capillary 165 (H) 70 - 99 mg/dL    Comment: Glucose reference range applies only to  samples taken after fasting for at least 8 hours.  Blood Culture (routine x 2)     Status: None (Preliminary result)   Collection Time: 09/24/20  4:41 PM   Specimen: Right Antecubital; Blood  Result Value Ref Range   Specimen Description      RIGHT ANTECUBITAL BOTTLES DRAWN AEROBIC AND ANAEROBIC   Special Requests      Blood Culture adequate volume Performed at [facility], [address], [place], [place] [address]    Culture PENDING    Report Status PENDING   Blood Culture (routine x 2)     Status: None (Preliminary result)   Collection Time: 09/24/20  4:43 PM   Specimen: BLOOD RIGHT WRIST  Result Value Ref Range   Specimen Description      BLOOD RIGHT WRIST BOTTLES DRAWN AEROBIC AND ANAEROBIC   Special Requests      Blood Culture adequate volume Performed at [facility], [address] [place], [place] [address]    Culture PENDING  Report Status PENDING   Lactic acid, plasma     Status: None   Collection Time: 09/24/20  4:51 PM  Result Value Ref Range   Lactic Acid, Venous 0.5 0.5 - 1.9 mmol/L    Comment: Performed at [facility], [address] [place], [place] [address]  Protime-INR     Status: None   Collection Time: 09/24/20  4:51 PM  Result Value Ref Range   Prothrombin Time 14.2 11.4 - 15.2 seconds   INR 1.1 0.8 - 1.2    Comment: (NOTE) INR goal varies based on device and disease states. Performed at [facility], [address] [place], [place] [address]   APTT     Status: None   Collection Time: 09/24/20  4:51 PM  Result Value Ref Range   aPTT 27 24 - 36 seconds    Comment: Performed at [facility], [address] [place], [place] [address]  Urinalysis, Routine w reflex microscopic     Status: Abnormal   Collection Time: 09/24/20  5:00 PM  Result Value Ref Range   Color, Urine YELLOW YELLOW   APPearance HAZY (A) CLEAR   Specific Gravity, Urine 1.020 1.005 - 1.030   pH 6.0 5.0 - 8.0   Glucose, UA NEGATIVE NEGATIVE mg/dL   Hgb urine dipstick SMALL (A)  NEGATIVE   Bilirubin Urine NEGATIVE NEGATIVE   Ketones, ur 80 (A) NEGATIVE mg/dL   Protein, ur 100 (A) NEGATIVE mg/dL   Nitrite POSITIVE (A) NEGATIVE   Leukocytes,Ua LARGE (A) NEGATIVE   RBC / HPF 0-5 0 - 5 RBC/hpf   WBC, UA >50 (H) 0 - 5 WBC/hpf   Bacteria, UA RARE (A) NONE SEEN   Squamous Epithelial / LPF 0-5 0 - 5   Mucus PRESENT     Comment: Performed at [facility], [address], [place], [place] [address]    Chemistries  Recent Labs  Lab 09/24/20 1610  NA 136  K 3.9  CL 104  CO2 25  GLUCOSE 174*  BUN 15  CREATININE 0.82  CALCIUM 8.9  AST 32  ALT 27  ALKPHOS 69  BILITOT 0.8   ------------------------------------------------------------------------------------------------------------------  ------------------------------------------------------------------------------------------------------------------ GFR: Estimated Creatinine Clearance: 64.7 mL/min (by C-G formula based on SCr of 0.82 mg/dL). Liver Function Tests: Recent Labs  Lab 09/24/20 1610  AST 32  ALT 27  ALKPHOS 69  BILITOT 0.8  PROT 7.2  ALBUMIN 3.6   No results for input(s): LIPASE, AMYLASE in the last 168 hours. No results for input(s): AMMONIA in the last 168 hours. Coagulation Profile: Recent Labs  Lab 09/24/20 1651  INR 1.1   Cardiac Enzymes: No results for input(s): CKTOTAL, CKMB, CKMBINDEX, TROPONINI in the last 168 hours. BNP (last 3 results) No results for input(s): PROBNP in the last 8760 hours. HbA1C: No results for input(s): HGBA1C in the last 72 hours. CBG: Recent Labs  Lab 09/24/20 1636  GLUCAP 165*   Lipid Profile: No results for input(s): CHOL, HDL, LDLCALC, TRIG, CHOLHDL, LDLDIRECT in the last 72 hours. Thyroid Function Tests: No results for input(s): TSH, T4TOTAL, FREET4, T3FREE, THYROIDAB in the last 72 hours. Anemia Panel: No results for input(s): VITAMINB12, FOLATE, FERRITIN, TIBC, IRON, RETICCTPCT in the last 72  hours.  --------------------------------------------------------------------------------------------------------------- Urine analysis:    Component Value Date/Time   COLORURINE YELLOW 09/24/2020 1700   APPEARANCEUR HAZY (A) 09/24/2020 1700   LABSPEC 1.020 09/24/2020 1700   PHURINE 6.0 09/24/2020 1700   GLUCOSEU NEGATIVE 09/24/2020 1700   HGBUR SMALL (A)  09/24/2020 1700   BILIRUBINUR NEGATIVE 09/24/2020 1700   KETONESUR 80 (A) 09/24/2020 1700   PROTEINUR 100 (A) 09/24/2020 1700   NITRITE POSITIVE (A) 09/24/2020 1700   LEUKOCYTESUR LARGE (A) 09/24/2020 1700      Imaging Results:    CT LUMBAR SPINE W CONTRAST  Result Date: 09/24/2020 CLINICAL DATA:  Clinical suspicion for infection. Lower back pain vs infection suspected. pt fell about three weeks ago and was first told that she cracked her pelvis, but then was told that it wasn't broken, states that she is here for weakness in her lower extremities with some chronic back pain prior hx of surgeries last one in 2006 per patient. Hx of breast cancer, DM, HTN. EXAM: CT LUMBAR SPINE WITH CONTRAST TECHNIQUE: Multidetector CT imaging of the lumbar spine was performed with intravenous contrast administration. CONTRAST:  28m OMNIPAQUE IOHEXOL 350 MG/ML SOLN COMPARISON:  Lumbar MRI, 07/15/2019. FINDINGS: Segmentation: 5 lumbar type vertebrae. Alignment: No spondylolisthesis. Minor curvature, upper lumbar spine convex the left and lower lumbar spine convex to the right. Vertebrae: No fracture or bone lesion. Paraspinal and other soft tissues: No paraspinal mass, inflammation or fluid collection. Disc levels: Posterior lumbar spine fusion, L4, L5 and S1, with well-positioned bilateral pedicle screws and intact interconnecting rods. Mature appearing bony fusion across the posterior elements associated with the orthopedic hardware. T12-L1: Mild loss of disc height. Endplate spurring anteriorly. No significant posterior disc bulging. No disc herniation.  Mild facet degenerative change. No significant stenosis. L1-L2: Moderate loss of disc height. Disc bulging and endplate spurring. Mild to moderate bilateral facet degenerative change. Central spinal canal narrowed, 7 mm anterior to posterior. No convincing disc herniation. Moderate right neural foraminal narrowing. L2-L3: Mild to moderate loss of disc height. Mild diffuse disc bulging. Mild facet degenerative change. Mild central stenosis. Moderate bilateral neural foraminal narrowing. No convincing disc herniation. L3-L4: Marked loss of disc height. Endplate sclerosis, bulging and osteophytes. Moderate bilateral facet degenerative change. Mild central stenosis. Mild left neural foraminal narrowing. No convincing disc herniation. L4-L5 spinal canal contents not well evaluated. Spinal canal appears widely patent. No convincing mass or fluid collection. Mild left neural foraminal narrowing. L5-S1: Spinal canal contents not well visualized due to artifact from the orthopedic hardware. Spinal canal appears widely patent. No convincing fluid collection. No significant stenosis. IMPRESSION: 1. No fracture or acute finding. No abnormal enhancement or fluid collection or inflammatory change to suggest infection. No abnormalities the disc spaces to suggest discitis/osteomyelitis. 2. Status post posterior fusion, L4 through S1. Orthopedic hardware is well-seated. There is mature bone graft material along the posterior elements. 3. Degenerative changes throughout the visualized spine as detailed. No convincing disc herniation. Electronically Signed   By: DLajean ManesM.D.   On: 09/24/2020 18:12   DG Chest Port 1 View  Result Date: 09/24/2020 CLINICAL DATA:  Fever and weakness.  Fell 3 weeks ago. EXAM: PORTABLE CHEST 1 VIEW COMPARISON:  08/22/2004 FINDINGS: Normal heart size and pulmonary vascularity. No focal airspace disease or consolidation in the lungs. No blunting of costophrenic angles. No pneumothorax. Mediastinal  contours appear intact. Calcification of the aorta. Calcifications over the left chest likely in the soft tissues of the breast or axilla. Surgical clips in the left axilla. Degenerative changes in the spine and shoulders. IMPRESSION: No active disease. Electronically Signed   By: WLucienne CapersM.D.   On: 09/24/2020 17:45    My personal review of EKG: Rhythm NSR, Rate 93/min, QTc 432 ,no Acute ST changes  Assessment & Plan:    Principal Problem:   Generalized weakness Active Problems:   Heart murmur   Hyperlipidemia   Diabetes mellitus type 2 in obese (HCC)   Acute lower UTI   Generalized weakness With weakness especially in the lower extremities bilaterally and more on the left than the right CT lumbar spine with contrast does not show acute findings MRI lumbar spine in the a.m. Consult neurology Urine incontinence is most likely from UTI-patient does report new urinary incontinence for 1 month.  No convincing disc herniation on CT with contrast.  Until we have lumbar imaging back, Decadron 10 mg given to cover. Symptoms have been ongoing for weeks, with the weakness greater on the left than the right there is some indication to do CT head, but patient already had contrast and would not be able to have CT head without contrast for 12 hours.  This image has not been ordered, but may consider depending on her clinical progress/neuro recommendations etc. Consult PT eval and treat Patient may need placement at rehab Heart murmur With fever -most likely fevers from UTI Patient reports that she has never been told she has a heart murmur No echo in our system Echo in the a.m. UTI Urine culture pending Continue Rocephin Hyperlipidemia Continue statin Hypertension Continue lisinopril, metoprolol, hydrochlorothiazide Diabetes mellitus type 2 Hold oral hypoglycemics Sliding scale coverage Monitor CBGs   DVT Prophylaxis-Heparin- SCDs   AM Labs Ordered, also please review Full  Orders  Family Communication: No family at bedside  Code Status: DNR  Admission status: Observation  Time spent in minutes : [place] DO

## 2020-09-24 NOTE — ED Notes (Signed)
hospitalist left room after assessing patient

## 2020-09-25 ENCOUNTER — Observation Stay (HOSPITAL_COMMUNITY): Payer: Medicare HMO

## 2020-09-25 DIAGNOSIS — E669 Obesity, unspecified: Secondary | ICD-10-CM | POA: Diagnosis not present

## 2020-09-25 DIAGNOSIS — E785 Hyperlipidemia, unspecified: Secondary | ICD-10-CM

## 2020-09-25 DIAGNOSIS — R011 Cardiac murmur, unspecified: Secondary | ICD-10-CM

## 2020-09-25 DIAGNOSIS — E1169 Type 2 diabetes mellitus with other specified complication: Secondary | ICD-10-CM | POA: Diagnosis not present

## 2020-09-25 DIAGNOSIS — R531 Weakness: Secondary | ICD-10-CM | POA: Diagnosis not present

## 2020-09-25 DIAGNOSIS — N390 Urinary tract infection, site not specified: Secondary | ICD-10-CM | POA: Diagnosis not present

## 2020-09-25 DIAGNOSIS — M545 Low back pain, unspecified: Secondary | ICD-10-CM | POA: Diagnosis not present

## 2020-09-25 DIAGNOSIS — Z6830 Body mass index (BMI) 30.0-30.9, adult: Secondary | ICD-10-CM | POA: Diagnosis not present

## 2020-09-25 DIAGNOSIS — E6609 Other obesity due to excess calories: Secondary | ICD-10-CM

## 2020-09-25 LAB — CBC WITH DIFFERENTIAL/PLATELET
Abs Immature Granulocytes: 0.02 10*3/uL (ref 0.00–0.07)
Basophils Absolute: 0 10*3/uL (ref 0.0–0.1)
Basophils Relative: 1 %
Eosinophils Absolute: 0 10*3/uL (ref 0.0–0.5)
Eosinophils Relative: 0 %
HCT: 39.7 % (ref 36.0–46.0)
Hemoglobin: 12.7 g/dL (ref 12.0–15.0)
Immature Granulocytes: 1 %
Lymphocytes Relative: 8 %
Lymphs Abs: 0.3 10*3/uL — ABNORMAL LOW (ref 0.7–4.0)
MCH: 26.5 pg (ref 26.0–34.0)
MCHC: 32 g/dL (ref 30.0–36.0)
MCV: 82.7 fL (ref 80.0–100.0)
Monocytes Absolute: 0.1 10*3/uL (ref 0.1–1.0)
Monocytes Relative: 3 %
Neutro Abs: 3.6 10*3/uL (ref 1.7–7.7)
Neutrophils Relative %: 87 %
Platelets: 185 10*3/uL (ref 150–400)
RBC: 4.8 MIL/uL (ref 3.87–5.11)
RDW: 13.5 % (ref 11.5–15.5)
WBC: 4.1 10*3/uL (ref 4.0–10.5)
nRBC: 0 % (ref 0.0–0.2)

## 2020-09-25 LAB — COMPREHENSIVE METABOLIC PANEL
ALT: 29 U/L (ref 0–44)
AST: 34 U/L (ref 15–41)
Albumin: 3.3 g/dL — ABNORMAL LOW (ref 3.5–5.0)
Alkaline Phosphatase: 60 U/L (ref 38–126)
Anion gap: 11 (ref 5–15)
BUN: 17 mg/dL (ref 8–23)
CO2: 23 mmol/L (ref 22–32)
Calcium: 9.2 mg/dL (ref 8.9–10.3)
Chloride: 103 mmol/L (ref 98–111)
Creatinine, Ser: 0.75 mg/dL (ref 0.44–1.00)
GFR, Estimated: >60 mL/min (ref >60)
Glucose, Bld: 325 mg/dL — ABNORMAL HIGH (ref 70–99)
Potassium: 3.3 mmol/L — ABNORMAL LOW (ref 3.5–5.1)
Sodium: 137 mmol/L (ref 135–145)
Total Bilirubin: 0.7 mg/dL (ref 0.3–1.2)
Total Protein: 6.6 g/dL (ref 6.5–8.1)

## 2020-09-25 LAB — ECHOCARDIOGRAM COMPLETE
AR max vel: 1.44 cm2
AV Area VTI: 1.76 cm2
AV Area mean vel: 1.46 cm2
AV Mean grad: 10 mmHg
AV Peak grad: 18.4 mmHg
Ao pk vel: 2.15 m/s
Area-P 1/2: 4.89 cm2
Height: 64 in
MV VTI: 4.32 cm2
S' Lateral: 2.45 cm
Weight: 2848 [oz_av]

## 2020-09-25 LAB — HEMOGLOBIN A1C
Hgb A1c MFr Bld: 6.1 % — ABNORMAL HIGH (ref 4.8–5.6)
Mean Plasma Glucose: 128.37 mg/dL

## 2020-09-25 LAB — GLUCOSE, CAPILLARY
Glucose-Capillary: 140 mg/dL — ABNORMAL HIGH (ref 70–99)
Glucose-Capillary: 161 mg/dL — ABNORMAL HIGH (ref 70–99)

## 2020-09-25 LAB — MAGNESIUM: Magnesium: 1.7 mg/dL (ref 1.7–2.4)

## 2020-09-25 LAB — CBG MONITORING, ED
Glucose-Capillary: 247 mg/dL — ABNORMAL HIGH (ref 70–99)
Glucose-Capillary: 138 mg/dL — ABNORMAL HIGH (ref 70–99)

## 2020-09-25 LAB — TSH: TSH: 0.112 u[IU]/mL — ABNORMAL LOW (ref 0.350–4.500)

## 2020-09-25 LAB — T4, FREE: Free T4: 1.44 ng/dL — ABNORMAL HIGH (ref 0.61–1.12)

## 2020-09-25 MED ORDER — MORPHINE SULFATE (PF) 2 MG/ML IV SOLN: 2.0000 mg | INTRAVENOUS | Status: DC | PRN

## 2020-09-25 MED ORDER — MAGNESIUM SULFATE 2 GM/50ML IV SOLN
2.0000 g | Freq: Once | INTRAVENOUS | Status: AC
  Administered 2020-09-25: 2 g via INTRAVENOUS
  Filled 2020-09-25: qty 50

## 2020-09-25 MED ORDER — POTASSIUM CHLORIDE CRYS ER 20 MEQ PO TBCR
40.0000 meq | EXTENDED_RELEASE_TABLET | Freq: Once | ORAL | Status: AC
  Administered 2020-09-25: 40 meq via ORAL
  Filled 2020-09-25: qty 2

## 2020-09-25 MED ORDER — INSULIN DETEMIR 100 UNIT/ML ~~LOC~~ SOLN
10.0000 [IU] | Freq: Every day | SUBCUTANEOUS | Status: DC
  Administered 2020-09-25 – 2020-09-28 (×4): 10 [IU] via SUBCUTANEOUS
  Filled 2020-09-25 (×5): qty 0.1

## 2020-09-25 MED ORDER — HYDRALAZINE HCL 20 MG/ML IJ SOLN
10.0000 mg | Freq: Four times a day (QID) | INTRAMUSCULAR | Status: DC | PRN
  Administered 2020-09-25 – 2020-09-26 (×2): 10 mg via INTRAVENOUS
  Filled 2020-09-25 (×2): qty 1

## 2020-09-25 MED ORDER — OXYCODONE-ACETAMINOPHEN 5-325 MG PO TABS
1.0000 | ORAL_TABLET | Freq: Two times a day (BID) | ORAL | Status: DC | PRN
  Administered 2020-09-25: 1 via ORAL
  Filled 2020-09-25: qty 1

## 2020-09-25 MED ORDER — GADOBUTROL 1 MMOL/ML IV SOLN
10.0000 mL | Freq: Once | INTRAVENOUS | Status: AC | PRN
  Administered 2020-09-25: 10 mL via INTRAVENOUS

## 2020-09-25 NOTE — Progress Notes (Signed)
*  PRELIMINARY RESULTS* Echocardiogram 2D Echocardiogram has been performed.  Elpidio Anis 09/25/2020, 9:19 AM

## 2020-09-25 NOTE — Progress Notes (Signed)
PROGRESS NOTE    [name]  [id number] DOB: [birth year] DOA: 09/24/2020 PCP: Glenda Chroman, MD   Chief Complaint  Patient presents with   Weakness    Brief admission Narrative:  As per H&P written by Dr. Clearence Ped on 09/24/20  Vanessa Stephens  is a 70 y.o. female, with history of DMII, HTN HLD, and more presents to the ED with a chief complaint of bilateral lower extremity weakness.  Patient reports that this started a month ago but has been progressively worse since it started.  This morning she could not even stand up.  She reports that it is normal for her to walk with a Rollator at baseline.  Lately her legs have felt so weak she felt unsafe even with a Rollator.  She reports she feels unsafe in the shower as well even with the stool to sit on in the shower.  The shower has 1 step that she has to lift her leg over, and she feels very unsteady with that.  She reports today she could not get out of bed without an assist.  This is new for her.  The husband had to come and get her out of bed to walker to the bathroom.  Patient reports that she has been totally incontinent of urine for 1 month.  She has a sensation that she is voiding, but cannot control it.  She has not had any loss of control of her bowels.  She denies any saddle anesthesia.  She does have back pain that feels like a dull ache going across the lower back from hip to hip.  She denies any sharp pains.  She had a fever when she got to the ER today, but does not think that she had a fever at home.  She has not had any dysuria, hematuria.  She wants to get back to the point where she can get herself out of bed, walked to the bathroom, and take a shower by herself.  She reports that she was able to do that 1 month ago.  She does report that she has had back pain since her back surgery in 2006.  She is also had fatigue since her back surgery in 2006.  She has no other complaints at this time.     Patient does not smoke, does  not drink regularly, does not use illicit drugs.  She is not not vaccinated for COVID.  She is DNR  Assessment & Plan: 1-Generalized weakness -with main affectation on left LE. -will check B12 and follow  MRI results. -PT service evaluation requested -if needed will check CT w/o contrast  -follow neurology service recommendations -continue tx for UTI and provide supportive care.  2-UTI -no dysuria currently -still having increase frequency -follow culture results -continue current antibiotics. -PRN antipyretics ordered  3-Heart murmur -follow echo results  4-Hyperlipidemia -continue statins   5-Diabetes mellitus type 2 in obese (HCC) -continue holding oal hypoglycemic agents -follow CBG's and adjust hypoglycemic regimen as needed -decadron given earlier causing some increase in sugar level, will add levemir to regimen  6-class 1 obesity -low calorie diet and portion control  -Body mass index is 30.55 kg/m.    DVT prophylaxis: SCD's Code Status: DNR Family Communication: no family at bedside; patient was competent on examination and expressed she will update family herself.  Disposition:   Status is: Observation  Dispo: The patient is from: Home  Anticipated d/c is to:  to be determined              Patient currently is not medically stable to d/c.   Difficult to place patient No       Consultants:  None   Procedures:  See below for x-ray reports 2-D echo: pending  Antimicrobials:  Rocephin    Subjective: Afebrile, still feeling weak (mainly in her LLE); denies CP, SOB, nausea and vomiting. Patient reports increase urinary frequency, no dysuria.  Objective: Vitals:   09/25/20 0200 09/25/20 0345 09/25/20 0738 09/25/20 0800  BP: (!) 208/92 (!) 145/74 (!) 167/76 (!) 183/79  Pulse: 79 92 83 88  Resp: 18 (!) '21 20 15  '$ Temp:      TempSrc:      SpO2: 94% 97% 97% 97%  Weight:      Height:       No intake or output data in the 24 hours  ending 09/25/20 0912 Filed Weights   09/24/20 1545  Weight: 80.7 kg    Examination:  General exam: Appears calm and comfortable, reports no pain, no nausea, no vomiting and is currently afebrile. Still with increase frequency reported, but no dysuria.  Respiratory system: Clear to auscultation. Respiratory effort normal. Good saturation on RA, no using accessory muscles. Cardiovascular system: S1 & S2 heard, RRR. No rubs, gallops or JVD, soft SEM appreciated on exam.  Gastrointestinal system: Abdomen is nondistended, soft and nontender. No organomegaly or masses felt. Normal bowel sounds heard. Central nervous system: Alert and oriented. Generalized weakness, but mainly affecting her left LE according to patient. Extremities: no cyanosis, no clubbing.  Skin: No petechiae. Psychiatry: Judgement and insight appear normal. Mood & affect appropriate.     Data Reviewed: I have personally reviewed following labs and imaging studies  CBC: Recent Labs  Lab 09/24/20 1610 09/25/20 0619  WBC 6.1 4.1  NEUTROABS 5.3 3.6  HGB 13.4 12.7  HCT 41.3 39.7  MCV 83.6 82.7  PLT 195 [id number]    Basic Metabolic Panel: Recent Labs  Lab 09/24/20 1610 09/25/20 0619  NA 136 137  K 3.9 3.3*  CL 104 103  CO2 25 23  GLUCOSE 174* 325*  BUN 15 17  CREATININE 0.82 0.75  CALCIUM 8.9 9.2  MG  --  1.7    GFR: Estimated Creatinine Clearance: 66.3 mL/min (by C-G formula based on SCr of 0.75 mg/dL).  Liver Function Tests: Recent Labs  Lab 09/24/20 1610 09/25/20 0619  AST 32 34  ALT 27 29  ALKPHOS 69 60  BILITOT 0.8 0.7  PROT 7.2 6.6  ALBUMIN 3.6 3.3*    CBG: Recent Labs  Lab 09/24/20 1636 09/24/20 2256 09/25/20 0741  GLUCAP 165* 99 247*    Recent Results (from the past 240 hour(s))  Resp Panel by RT-PCR (Flu A&B, Covid) Nasopharyngeal Swab     Status: None   Collection Time: 09/24/20  4:27 PM   Specimen: Nasopharyngeal Swab; Nasopharyngeal(NP) swabs in vial transport medium  Result  Value Ref Range Status   SARS Coronavirus 2 by RT PCR NEGATIVE NEGATIVE Final    Comment: (NOTE) SARS-CoV-2 target nucleic acids are NOT DETECTED.  The SARS-CoV-2 RNA is generally detectable in upper respiratory specimens during the acute phase of infection. The lowest concentration of SARS-CoV-2 viral copies this assay can detect is 138 copies/mL. A negative result does not preclude SARS-Cov-2 infection and should not be used as the sole basis for treatment or other patient management decisions. A  negative result may occur with  improper specimen collection/handling, submission of specimen other than nasopharyngeal swab, presence of viral mutation(s) within the areas targeted by this assay, and inadequate number of viral copies(<138 copies/mL). A negative result must be combined with clinical observations, patient history, and epidemiological information. The expected result is Negative.  Fact Sheet for Patients:  EntrepreneurPulse.com.au  Fact Sheet for Healthcare Providers:  IncredibleEmployment.be  This test is no t yet approved or cleared by the [place] FDA and  has been authorized for detection and/or diagnosis of SARS-CoV-2 by FDA under an Emergency Use Authorization (EUA). This EUA will remain  in effect (meaning this test can be used) for the duration of the COVID-19 declaration under Section 564(b)(1) of the Act, 21 U.S.C.section 360bbb-3(b)(1), unless the authorization is terminated  or revoked sooner.       Influenza A by PCR NEGATIVE NEGATIVE Final   Influenza B by PCR NEGATIVE NEGATIVE Final    Comment: (NOTE) The Xpert Xpress SARS-CoV-2/FLU/RSV plus assay is intended as an aid in the diagnosis of influenza from Nasopharyngeal swab specimens and should not be used as a sole basis for treatment. Nasal washings and aspirates are unacceptable for Xpert Xpress SARS-CoV-2/FLU/RSV testing.  Fact Sheet for  Patients: EntrepreneurPulse.com.au  Fact Sheet for Healthcare Providers: IncredibleEmployment.be  This test is not yet approved or cleared by the [place] FDA and has been authorized for detection and/or diagnosis of SARS-CoV-2 by FDA under an Emergency Use Authorization (EUA). This EUA will remain in effect (meaning this test can be used) for the duration of the COVID-19 declaration under Section 564(b)(1) of the Act, 21 U.S.C. section 360bbb-3(b)(1), unless the authorization is terminated or revoked.  Performed at [facility], [address], [place], [place] [address]   Blood Culture (routine x 2)     Status: None (Preliminary result)   Collection Time: 09/24/20  4:41 PM   Specimen: Right Antecubital; Blood  Result Value Ref Range Status   Specimen Description   Final    RIGHT ANTECUBITAL BOTTLES DRAWN AEROBIC AND ANAEROBIC   Special Requests Blood Culture adequate volume  Final   Culture   Final    NO GROWTH < 24 HOURS Performed at [facility], [address] [place], [place] [address]    Report Status PENDING  Incomplete  Blood Culture (routine x 2)     Status: None (Preliminary result)   Collection Time: 09/24/20  4:43 PM   Specimen: BLOOD RIGHT WRIST  Result Value Ref Range Status   Specimen Description   Final    BLOOD RIGHT WRIST BOTTLES DRAWN AEROBIC AND ANAEROBIC   Special Requests Blood Culture adequate volume  Final   Culture   Final    NO GROWTH < 24 HOURS Performed at [facility], [address], [place], [place] [address]    Report Status PENDING  Incomplete    Radiology Studies: CT LUMBAR SPINE W CONTRAST  Result Date: 09/24/2020 CLINICAL DATA:  Clinical suspicion for infection. Lower back pain vs infection suspected. pt fell about three weeks ago and was first told that she cracked her pelvis, but then was told that it wasn't broken, states that she is here for weakness in her lower extremities with some chronic  back pain prior hx of surgeries last one in 2006 per patient. Hx of breast cancer, DM, HTN. EXAM: CT LUMBAR SPINE WITH CONTRAST TECHNIQUE: Multidetector CT imaging of the lumbar spine was performed with intravenous contrast administration. CONTRAST:  23m OMNIPAQUE  IOHEXOL 350 MG/ML SOLN COMPARISON:  Lumbar MRI, 07/15/2019. FINDINGS: Segmentation: 5 lumbar type vertebrae. Alignment: No spondylolisthesis. Minor curvature, upper lumbar spine convex the left and lower lumbar spine convex to the right. Vertebrae: No fracture or bone lesion. Paraspinal and other soft tissues: No paraspinal mass, inflammation or fluid collection. Disc levels: Posterior lumbar spine fusion, L4, L5 and S1, with well-positioned bilateral pedicle screws and intact interconnecting rods. Mature appearing bony fusion across the posterior elements associated with the orthopedic hardware. T12-L1: Mild loss of disc height. Endplate spurring anteriorly. No significant posterior disc bulging. No disc herniation. Mild facet degenerative change. No significant stenosis. L1-L2: Moderate loss of disc height. Disc bulging and endplate spurring. Mild to moderate bilateral facet degenerative change. Central spinal canal narrowed, 7 mm anterior to posterior. No convincing disc herniation. Moderate right neural foraminal narrowing. L2-L3: Mild to moderate loss of disc height. Mild diffuse disc bulging. Mild facet degenerative change. Mild central stenosis. Moderate bilateral neural foraminal narrowing. No convincing disc herniation. L3-L4: Marked loss of disc height. Endplate sclerosis, bulging and osteophytes. Moderate bilateral facet degenerative change. Mild central stenosis. Mild left neural foraminal narrowing. No convincing disc herniation. L4-L5 spinal canal contents not well evaluated. Spinal canal appears widely patent. No convincing mass or fluid collection. Mild left neural foraminal narrowing. L5-S1: Spinal canal contents not well visualized due  to artifact from the orthopedic hardware. Spinal canal appears widely patent. No convincing fluid collection. No significant stenosis. IMPRESSION: 1. No fracture or acute finding. No abnormal enhancement or fluid collection or inflammatory change to suggest infection. No abnormalities the disc spaces to suggest discitis/osteomyelitis. 2. Status post posterior fusion, L4 through S1. Orthopedic hardware is well-seated. There is mature bone graft material along the posterior elements. 3. Degenerative changes throughout the visualized spine as detailed. No convincing disc herniation. Electronically Signed   By: Lajean Manes M.D.   On: 09/24/2020 18:12   DG Chest Port 1 View  Result Date: 09/24/2020 CLINICAL DATA:  Fever and weakness.  Fell 3 weeks ago. EXAM: PORTABLE CHEST 1 VIEW COMPARISON:  08/22/2004 FINDINGS: Normal heart size and pulmonary vascularity. No focal airspace disease or consolidation in the lungs. No blunting of costophrenic angles. No pneumothorax. Mediastinal contours appear intact. Calcification of the aorta. Calcifications over the left chest likely in the soft tissues of the breast or axilla. Surgical clips in the left axilla. Degenerative changes in the spine and shoulders. IMPRESSION: No active disease. Electronically Signed   By: Lucienne Capers M.D.   On: 09/24/2020 17:45     Scheduled Meds:  heparin  5,000 Units Subcutaneous Q8H   lisinopril  20 mg Oral Daily   And   hydrochlorothiazide  12.5 mg Oral Daily   insulin aspart  0-15 Units Subcutaneous TID WC   insulin aspart  0-5 Units Subcutaneous QHS   insulin detemir  10 Units Subcutaneous Daily   metoprolol tartrate  50 mg Oral Daily   rosuvastatin  5 mg Oral Daily   Continuous Infusions:  cefTRIAXone (ROCEPHIN)  IV     methocarbamol (ROBAXIN) IV       LOS: 0 days    Time spent: 35 minutes   Barton Dubois, MD [facility]   To contact the attending provider between 7A-7P or the covering provider during  after hours 7P-7A, please log into the web site [email or web address] and access using universal [place] password for that web site. If you do not have the password, please call the hospital operator.  09/25/2020, 9:12  AM    

## 2020-09-25 NOTE — ED Notes (Signed)
Pt repositioned in the bed, purewick clean dry and intact. Gave patient a snack- crackers and orange juice per pt request. Call bell within reach.

## 2020-09-25 NOTE — TOC Initial Note (Signed)
Transition of Care [facility]) - Initial/Assessment Note    Patient Details  Name: Vanessa Stephens MRN: [id number] Date of Birth: 1950/05/11  Transition of Care [facility]) CM/SW Contact:    Shade Flood, LCSW Phone Number: 09/25/2020, 3:37 PM  Clinical Narrative:                  Pt admitted from home. PT recommending SNF rehab. Met with pt to assess and relay PT recommendation. Pt agreeable to SNF referral. CMS provider options given and will refer as requested. Selected SNF will need to obtain insurance auth.  TOC will follow.  Expected Discharge Plan: Skilled Nursing Facility Barriers to Discharge: Continued Medical Work up   Patient Goals and CMS Choice Patient states their goals for this hospitalization and ongoing recovery are:: rehab CMS Medicare.gov Compare Post Acute Care list provided to:: Patient Choice offered to / list presented to : Patient  Expected Discharge Plan and Services Expected Discharge Plan: [place] In-house Referral: Clinical Social Work   Post Acute Care Choice: [place] Living arrangements for the past 2 months: [place]                                      Prior Living Arrangements/Services Living arrangements for the past 2 months: Single Family Home Lives with:: Spouse Patient language and need for interpreter reviewed:: Yes Do you feel safe going back to the place where you live?: Yes      Need for Family Participation in Patient Care: Yes (Comment) Care giver support system in place?: Yes (comment)   Criminal Activity/Legal Involvement Pertinent to Current Situation/Hospitalization: No - Comment as needed  Activities of Daily Living Home Assistive Devices/Equipment:  (rolling walker) ADL Screening (condition at time of admission) Patient's cognitive ability adequate to safely complete daily activities?: Yes Is the patient deaf or have difficulty hearing?: No Does the patient have difficulty  seeing, even when wearing glasses/contacts?: Yes (due for eye exam) Does the patient have difficulty concentrating, remembering, or making decisions?: No Patient able to express need for assistance with ADLs?: Yes Does the patient have difficulty dressing or bathing?: Yes (husband assist) Independently performs ADLs?: No Communication: Independent Dressing (OT): Dependent Is this a change from baseline?: Pre-admission baseline Grooming: Independent Feeding: Independent Bathing: Dependent Is this a change from baseline?: Pre-admission baseline Toileting: Independent In/Out Bed: Independent Walks in Home: Dependent Is this a change from baseline?: Pre-admission baseline Does the patient have difficulty walking or climbing stairs?: Yes (ambulates with rolling walker) Weakness of Legs: Both Weakness of Arms/Hands: None  Permission Sought/Granted Permission sought to share information with : Facility Art therapist granted to share information with : Yes, Verbal Permission Granted     Permission granted to share info w AGENCY: SNFs        Emotional Assessment       Orientation: : Oriented to Self, Oriented to Place, Oriented to  Time, Oriented to Situation Alcohol / Substance Use: Not Applicable Psych Involvement: No (comment)  Admission diagnosis:  Generalized weakness [R53.1] Febrile urinary tract infection [N39.0] Patient Active Problem List   Diagnosis Date Noted   Generalized weakness 09/24/2020   Heart murmur 09/24/2020   Hyperlipidemia 09/24/2020   Diabetes mellitus type 2 in obese ([place]) 09/24/2020   Acute lower UTI 09/24/2020   PCP:  Glenda Chroman, MD Pharmacy:   [place],  [place] - [address] [place] [address] Phone: [phone] Fax: [phone]     Social Determinants of Health (SDOH) Interventions    Readmission Risk Interventions No flowsheet data found.

## 2020-09-25 NOTE — Evaluation (Signed)
Physical Therapy Evaluation Patient Details Name: Vanessa Stephens MRN: [id number] DOB: 29-Nov-1950 Today's Date: 09/25/2020   History of Present Illness  Vanessa Stephens  is a 70 y.o. female, with history of DMII, HTN HLD, and more presents to the ED with a chief complaint of bilateral lower extremity weakness.  Patient reports that this started a month ago but has been progressively worse since it started.  This morning she could not even stand up.  She reports that it is normal for her to walk with a Rollator at baseline.  Lately her legs have felt so weak she felt unsafe even with a Rollator.  She reports she feels unsafe in the shower as well even with the stool to sit on in the shower.  The shower has 1 step that she has to lift her leg over, and she feels very unsteady with that.  She reports today she could not get out of bed without an assist.  This is new for her.  The husband had to come and get her out of bed to walker to the bathroom.  Patient reports that she has been totally incontinent of urine for 1 month.  She has a sensation that she is voiding, but cannot control it.  She has not had any loss of control of her bowels.  She denies any saddle anesthesia.  She does have back pain that feels like a dull ache going across the lower back from hip to hip.  She denies any sharp pains.  She had a fever when she got to the ER today, but does not think that she had a fever at home.  She has not had any dysuria, hematuria.  She wants to get back to the point where she can get herself out of bed, walked to the bathroom, and take a shower by herself.  She reports that she was able to do that 1 month ago.  She does report that she has had back pain since her back surgery in 2006.  She is also had fatigue since her back surgery in 2006.  She has no other complaints at this time.   Clinical Impression  Patient demonstrates fair/poor carryover for rolling to side and sitting up from side lying position,  once seated unable to maintain sitting balance due to posterior leaning requiring verbal tactile cueing to maintain trunk control, very unsteady on feet with leaning backwards while standing, limited to a few side steps due to fall risk and poor standing balance.  Patient tolerated sitting up in chair after therapy - nursing staff notified.  Patient will benefit from continued physical therapy in hospital and recommended venue below to increase strength, balance, endurance for safe ADLs and gait.      Follow Up Recommendations SNF    Equipment Recommendations  None recommended by PT    Recommendations for Other Services       Precautions / Restrictions Precautions Precautions: Fall Restrictions Weight Bearing Restrictions: No      Mobility  Bed Mobility Overal bed mobility: Needs Assistance Bed Mobility: Rolling;Sidelying to Sit Rolling: Min assist Sidelying to sit: Mod assist       General bed mobility comments: increased time, labored movement, fair/poor carryover for log rolling, sitting up from sidelying, posterior lean    Transfers Overall transfer level: Needs assistance Equipment used: Rolling walker (2 wheeled) Transfers: Sit to/from [facility] Sit to Stand: Mod assist Stand pivot transfers: Mod assist  General transfer comment: frequent leaning/falling backwards  Ambulation/Gait Ambulation/Gait assistance: Mod assist;Max assist Gait Distance (Feet): 5 Feet Assistive device: Rolling walker (2 wheeled) Gait Pattern/deviations: Decreased step length - right;Decreased step length - left;Decreased stride length Gait velocity: decreased   General Gait Details: limited to a few steps at bedside due to frequent leaning/falling backwards with decreased coordination of BLE  Stairs            Wheelchair Mobility    Modified Rankin (Stroke Patients Only)       Balance Overall balance assessment: Needs assistance Sitting-balance  support: Feet supported;No upper extremity supported Sitting balance-Leahy Scale: Poor Sitting balance - Comments: fair/poor seated EOB Postural control: Posterior lean Standing balance support: During functional activity;Bilateral upper extremity supported Standing balance-Leahy Scale: Poor Standing balance comment: using RW                             Pertinent Vitals/Pain Pain Assessment: No/denies pain    Home Living Family/patient expects to be discharged to:: Private residence Living Arrangements: Spouse/significant other Available Help at Discharge: Family;Available 24 hours/day Type of Home: House Home Access: Stairs to enter Entrance Stairs-Rails: Right;Left (2 wide to reach both) Entrance Stairs-Number of Steps: 10-11 Home Layout: One level Home Equipment: Hand held shower head;Walker - 4 wheels;Walker - 2 wheels;Shower seat - built in      Prior Function Level of Independence: Independent with assistive device(s)         Comments: Electronics engineer PRN     Hand Dominance        Extremity/Trunk Assessment   Upper Extremity Assessment Upper Extremity Assessment: Generalized weakness    Lower Extremity Assessment Lower Extremity Assessment: Generalized weakness    Cervical / Trunk Assessment Cervical / Trunk Assessment: Normal  Communication   Communication: No difficulties  Cognition Arousal/Alertness: Awake/alert Behavior During Therapy: WFL for tasks assessed/performed Overall Cognitive Status: Within Functional Limits for tasks assessed                                        General Comments      Exercises     Assessment/Plan    PT Assessment Patient needs continued PT services  PT Problem List Decreased strength;Decreased activity tolerance;Decreased balance;Decreased mobility       PT Treatment Interventions DME instruction;Gait training;Stair training;Functional mobility  training;Therapeutic activities;Therapeutic exercise;Balance training;Patient/family education    PT Goals (Current goals can be found in the Care Plan section)  Acute Rehab PT Goals Patient Stated Goal: return home after rehab PT Goal Formulation: With patient Time For Goal Achievement: 10/09/20 Potential to Achieve Goals: Good    Frequency Min 3X/week   Barriers to discharge        Co-evaluation               AM-PAC PT "6 Clicks" Mobility  Outcome Measure Help needed turning from your back to your side while in a flat bed without using bedrails?: A Lot Help needed moving from lying on your back to sitting on the side of a flat bed without using bedrails?: A Lot Help needed moving to and from a bed to a chair (including a wheelchair)?: A Lot Help needed standing up from a chair using your arms (e.g., wheelchair or bedside chair)?: A Lot Help needed to walk in hospital room?: A Lot Help  needed climbing 3-5 steps with a railing? : Total 6 Click Score: 11    End of Session   Activity Tolerance: Patient tolerated treatment well;Patient limited by fatigue Patient left: in chair;with call bell/phone within reach Nurse Communication: Mobility status PT Visit Diagnosis: Unsteadiness on feet (R26.81);Other abnormalities of gait and mobility (R26.89);Muscle weakness (generalized) (M62.81)    Time: [id number] PT Time Calculation (min) (ACUTE ONLY): 30 min   Charges:   PT Evaluation $PT Eval Moderate Complexity: 1 Mod PT Treatments $Therapeutic Activity: 23-37 mins        3:43 PM, 09/25/20 Lonell Grandchild, MPT Physical Therapist with [facility] [phone] office [phone] mobile phone

## 2020-09-25 NOTE — ED Notes (Signed)
Pt transported to MRI 

## 2020-09-25 NOTE — NC FL2 (Signed)
[place] LEVEL OF CARE SCREENING TOOL     IDENTIFICATION  Patient Name: Vanessa Stephens Birthdate: Mar 11, 1950 Sex: female Admission Date (Current Location): 09/24/2020  [facility] and [place] Number:  [employer] and Address:  [place] [address], [place]      Provider Number: [id number]  Attending Physician Name and Address:  Barton Dubois, MD  Relative Name and Phone Number:       Current Level of Care: Hospital Recommended Level of Care: [place] Prior Approval Number:    Date Approved/Denied:   PASRR Number: [id number]  Discharge Plan: SNF    Current Diagnoses: Patient Active Problem List   Diagnosis Date Noted   Generalized weakness 09/24/2020   Heart murmur 09/24/2020   Hyperlipidemia 09/24/2020   Diabetes mellitus type 2 in obese ([place]) 09/24/2020   Acute lower UTI 09/24/2020    Orientation RESPIRATION BLADDER Height & Weight     Self, Time, Situation, Place  Normal Continent Weight: 178 lb 9.2 oz (81 kg) Height:  '5\' 4"'$  (162.6 cm)  BEHAVIORAL SYMPTOMS/MOOD NEUROLOGICAL BOWEL NUTRITION STATUS      Continent Diet (see dc summary)  AMBULATORY STATUS COMMUNICATION OF NEEDS Skin   Extensive Assist Verbally Normal                       Personal Care Assistance Level of Assistance  Bathing, Feeding, Dressing Bathing Assistance: Limited assistance Feeding assistance: Independent Dressing Assistance: Limited assistance     Functional Limitations Info  Sight, Hearing, Speech Sight Info: Adequate Hearing Info: Adequate Speech Info: Adequate    SPECIAL CARE FACTORS FREQUENCY  PT (By licensed PT), OT (By licensed OT)     PT Frequency: 5x week OT Frequency: 3x week            Contractures Contractures Info: Not present    Additional Factors Info  Code Status, Allergies Code Status Info: DNR Allergies Info: Codeine           Current Medications (09/25/2020):  This  is the current hospital active medication list Current Facility-Administered Medications  Medication Dose Route Frequency Provider Last Rate Last Admin   acetaminophen (TYLENOL) tablet 650 mg  650 mg Oral Q6H PRN Zierle-Ghosh, Asia B, DO   650 mg at 09/25/20 1438   Or   acetaminophen (TYLENOL) suppository 650 mg  650 mg Rectal Q6H PRN Zierle-Ghosh, Asia B, DO       cefTRIAXone (ROCEPHIN) 1 g in sodium chloride 0.9 % 100 mL IVPB  1 g Intravenous Q24H Zierle-Ghosh, Asia B, DO       heparin injection 5,000 Units  5,000 Units Subcutaneous Q8H Zierle-Ghosh, Asia B, DO   5,000 Units at 09/25/20 1438   hydrALAZINE (APRESOLINE) injection 10 mg  10 mg Intravenous Q6H PRN Zierle-Ghosh, Asia B, DO   10 mg at 09/25/20 0238   lisinopril (ZESTRIL) tablet 20 mg  20 mg Oral Daily Zierle-Ghosh, Asia B, DO   20 mg at 09/25/20 0946   And   hydrochlorothiazide (MICROZIDE) capsule 12.5 mg  12.5 mg Oral Daily Zierle-Ghosh, Asia B, DO   12.5 mg at 09/25/20 0946   insulin aspart (novoLOG) injection 0-15 Units  0-15 Units Subcutaneous TID WC Zierle-Ghosh, Asia B, DO   2 Units at 09/25/20 1142   insulin aspart (novoLOG) injection 0-5 Units  0-5 Units Subcutaneous QHS Zierle-Ghosh, Asia B, DO       insulin detemir (LEVEMIR) injection  10 Units  10 Units Subcutaneous Daily Barton Dubois, MD   10 Units at 09/25/20 1143   methocarbamol (ROBAXIN) 500 mg in dextrose 5 % 50 mL IVPB  500 mg Intravenous Q6H PRN Zierle-Ghosh, Asia B, DO       metoprolol tartrate (LOPRESSOR) tablet 50 mg  50 mg Oral Daily Zierle-Ghosh, Asia B, DO   50 mg at 09/25/20 0946   morphine 2 MG/ML injection 2 mg  2 mg Intravenous Q4H PRN Barton Dubois, MD       ondansetron [facility]) tablet 4 mg  4 mg Oral Q6H PRN Zierle-Ghosh, Asia B, DO       Or   ondansetron (ZOFRAN) injection 4 mg  4 mg Intravenous Q6H PRN Zierle-Ghosh, Asia B, DO       oxyCODONE-acetaminophen (PERCOCET/ROXICET) 5-325 MG per tablet 1 tablet  1 tablet Oral Q12H PRN Barton Dubois, MD        rosuvastatin (CRESTOR) tablet 5 mg  5 mg Oral Daily Zierle-Ghosh, Asia B, DO         Discharge Medications: Please see discharge summary for a list of discharge medications.  Relevant Imaging Results:  Relevant Lab Results:   Additional Information SSN: [id number] [address], [place]

## 2020-09-25 NOTE — Plan of Care (Signed)
  Problem: Acute Rehab PT Goals(only PT should resolve) Goal: Pt Will Go Supine/Side To Sit Outcome: Progressing Flowsheets (Taken 09/25/2020 1544) Pt will go Supine/Side to Sit: with minimal assist Goal: Patient Will Transfer Sit To/From Stand Outcome: Progressing Flowsheets (Taken 09/25/2020 1544) Patient will transfer sit to/from stand: with minimal assist Goal: Pt Will Transfer Bed To Chair/Chair To Bed Outcome: Progressing Flowsheets (Taken 09/25/2020 1544) Pt will Transfer Bed to Chair/Chair to Bed: with min assist Goal: Pt Will Ambulate Outcome: Progressing Flowsheets (Taken 09/25/2020 1544) Pt will Ambulate:  25 feet  with moderate assist  with rolling walker   3:45 PM, 09/25/20 Lonell Grandchild, MPT Physical Therapist with [facility] [phone] office [phone] mobile phone

## 2020-09-25 NOTE — ED Notes (Signed)
Pt returned from MRI °

## 2020-09-26 DIAGNOSIS — E785 Hyperlipidemia, unspecified: Secondary | ICD-10-CM | POA: Diagnosis not present

## 2020-09-26 DIAGNOSIS — T380X5A Adverse effect of glucocorticoids and synthetic analogues, initial encounter: Secondary | ICD-10-CM | POA: Diagnosis not present

## 2020-09-26 DIAGNOSIS — Z7984 Long term (current) use of oral hypoglycemic drugs: Secondary | ICD-10-CM | POA: Diagnosis not present

## 2020-09-26 DIAGNOSIS — K5900 Constipation, unspecified: Secondary | ICD-10-CM | POA: Diagnosis present

## 2020-09-26 DIAGNOSIS — Z885 Allergy status to narcotic agent status: Secondary | ICD-10-CM | POA: Diagnosis not present

## 2020-09-26 DIAGNOSIS — E669 Obesity, unspecified: Secondary | ICD-10-CM | POA: Diagnosis not present

## 2020-09-26 DIAGNOSIS — G8929 Other chronic pain: Secondary | ICD-10-CM | POA: Diagnosis present

## 2020-09-26 DIAGNOSIS — E6609 Other obesity due to excess calories: Secondary | ICD-10-CM | POA: Diagnosis not present

## 2020-09-26 DIAGNOSIS — Z79899 Other long term (current) drug therapy: Secondary | ICD-10-CM | POA: Diagnosis not present

## 2020-09-26 DIAGNOSIS — B9620 Unspecified Escherichia coli [E. coli] as the cause of diseases classified elsewhere: Secondary | ICD-10-CM | POA: Diagnosis not present

## 2020-09-26 DIAGNOSIS — Z87891 Personal history of nicotine dependence: Secondary | ICD-10-CM | POA: Diagnosis not present

## 2020-09-26 DIAGNOSIS — I10 Essential (primary) hypertension: Secondary | ICD-10-CM | POA: Diagnosis not present

## 2020-09-26 DIAGNOSIS — Z791 Long term (current) use of non-steroidal anti-inflammatories (NSAID): Secondary | ICD-10-CM | POA: Diagnosis not present

## 2020-09-26 DIAGNOSIS — R011 Cardiac murmur, unspecified: Secondary | ICD-10-CM | POA: Diagnosis not present

## 2020-09-26 DIAGNOSIS — M4807 Spinal stenosis, lumbosacral region: Secondary | ICD-10-CM | POA: Diagnosis not present

## 2020-09-26 DIAGNOSIS — Z28310 Unvaccinated for covid-19: Secondary | ICD-10-CM | POA: Diagnosis not present

## 2020-09-26 DIAGNOSIS — Z66 Do not resuscitate: Secondary | ICD-10-CM | POA: Diagnosis not present

## 2020-09-26 DIAGNOSIS — M48061 Spinal stenosis, lumbar region without neurogenic claudication: Secondary | ICD-10-CM | POA: Diagnosis not present

## 2020-09-26 DIAGNOSIS — R531 Weakness: Secondary | ICD-10-CM | POA: Diagnosis not present

## 2020-09-26 DIAGNOSIS — Z20822 Contact with and (suspected) exposure to covid-19: Secondary | ICD-10-CM | POA: Diagnosis not present

## 2020-09-26 DIAGNOSIS — Z8249 Family history of ischemic heart disease and other diseases of the circulatory system: Secondary | ICD-10-CM | POA: Diagnosis not present

## 2020-09-26 DIAGNOSIS — E1165 Type 2 diabetes mellitus with hyperglycemia: Secondary | ICD-10-CM | POA: Diagnosis not present

## 2020-09-26 DIAGNOSIS — E1169 Type 2 diabetes mellitus with other specified complication: Secondary | ICD-10-CM | POA: Diagnosis not present

## 2020-09-26 DIAGNOSIS — M791 Myalgia, unspecified site: Secondary | ICD-10-CM | POA: Diagnosis not present

## 2020-09-26 DIAGNOSIS — Z6830 Body mass index (BMI) 30.0-30.9, adult: Secondary | ICD-10-CM | POA: Diagnosis not present

## 2020-09-26 DIAGNOSIS — R32 Unspecified urinary incontinence: Secondary | ICD-10-CM | POA: Diagnosis present

## 2020-09-26 DIAGNOSIS — N390 Urinary tract infection, site not specified: Secondary | ICD-10-CM | POA: Diagnosis not present

## 2020-09-26 LAB — BASIC METABOLIC PANEL
Anion gap: 9 (ref 5–15)
BUN: 22 mg/dL (ref 8–23)
CO2: 25 mmol/L (ref 22–32)
Calcium: 9 mg/dL (ref 8.9–10.3)
Chloride: 105 mmol/L (ref 98–111)
Creatinine, Ser: 0.76 mg/dL (ref 0.44–1.00)
GFR, Estimated: >60 mL/min (ref >60)
Glucose, Bld: 156 mg/dL — ABNORMAL HIGH (ref 70–99)
Potassium: 3.4 mmol/L — ABNORMAL LOW (ref 3.5–5.1)
Sodium: 139 mmol/L (ref 135–145)

## 2020-09-26 LAB — GLUCOSE, CAPILLARY
Glucose-Capillary: 131 mg/dL — ABNORMAL HIGH (ref 70–99)
Glucose-Capillary: 189 mg/dL — ABNORMAL HIGH (ref 70–99)
Glucose-Capillary: 117 mg/dL — ABNORMAL HIGH (ref 70–99)
Glucose-Capillary: 258 mg/dL — ABNORMAL HIGH (ref 70–99)

## 2020-09-26 LAB — VITAMIN B12: Vitamin B-12: 511 pg/mL (ref 180–914)

## 2020-09-26 LAB — MAGNESIUM: Magnesium: 2 mg/dL (ref 1.7–2.4)

## 2020-09-26 MED ORDER — DEXAMETHASONE 4 MG PO TABS
6.0000 mg | ORAL_TABLET | Freq: Every day | ORAL | Status: DC
  Administered 2020-09-26 – 2020-09-28 (×3): 6 mg via ORAL
  Filled 2020-09-26 (×3): qty 2

## 2020-09-26 MED ORDER — POTASSIUM CHLORIDE CRYS ER 20 MEQ PO TBCR
40.0000 meq | EXTENDED_RELEASE_TABLET | Freq: Once | ORAL | Status: AC
  Administered 2020-09-26: 40 meq via ORAL
  Filled 2020-09-26: qty 2

## 2020-09-26 NOTE — TOC Progression Note (Signed)
Transition of Care [facility]) - Progression Note    Patient Details  Name: Vanessa Stephens MRN: [id number] Date of Birth: Oct 11, 1950  Transition of Care [facility]) CM/SW Contact  Shade Flood, LCSW Phone Number: 09/26/2020, 2:06 PM  Clinical Narrative:     TOC following. Provided pt with bed offers. [place] selected by pt. [facility] at [facility] to start insurance authorization today.   TOC will follow up in AM.  Expected Discharge Plan: [place] Barriers to Discharge: Continued Medical Work up  Expected Discharge Plan and Services Expected Discharge Plan: [place] In-house Referral: Clinical Social Work   Post Acute Care Choice: [place] Living arrangements for the past 2 months: Single Family Home                                       Social Determinants of Health (SDOH) Interventions    Readmission Risk Interventions No flowsheet data found.

## 2020-09-26 NOTE — Progress Notes (Signed)
Physical Therapy Treatment Patient Details Name: Vanessa Stephens MRN: [id number] DOB: [birth year] Today's Date: 09/26/2020    History of Present Illness Vanessa Stephens is a 70 y.o. female presents to the ED with a chief complaint of bilateral lower extremity weakness. PMH: DMII, HTN HLD    PT Comments    Pt with difficulty finding upright postural alignment in sitting and standing, prefers posterior lean to the L and unaware of deficit. Pt able to perform STS with mod A +1 but unable to coordinate steps or pivot despite multimodal cues and 1 person assist. Pt ultimately requiring +2 assist to pivot back to bed. Pt performs seated BLE strengthening exercises with constant cueing to lean forward. Educated pt on importance of time OOB to reorient self to upright and pt verbalized understanding. Pt grip equal, smile equal, no tongue deviation- notified LPN of posterior L lean and no focal deficits noted by therapist.   Follow Up Recommendations  SNF     Equipment Recommendations  None recommended by PT    Recommendations for Other Services       Precautions / Restrictions Precautions Precautions: Fall Restrictions Weight Bearing Restrictions: No    Mobility  Bed Mobility Overal bed mobility: Needs Assistance Bed Mobility: Sit to Supine  Sit to supine: Mod assist   General bed mobility comments: mod A to return to supine to lift BLE back into bed    Transfers Overall transfer level: Needs assistance Equipment used: Rolling walker (2 wheeled);2 person hand held assist Transfers: Sit to/from [facility] Sit to Stand: Mod assist Stand pivot transfers: Mod assist;+2 physical assistance  General transfer comment: mod A to power to stand for 5 reps, strong posterior-L lean despite verbal cues and tactile targets pt unable to assume normal postural alignment; pt requires +2 for pivot to bed from recliner due to strong posterior-L lean, heavy verbal cues for sequencing  ultimately requiring tactile cues to assist pt with weight-shifting and pivoting over to bed  Ambulation/Gait  General Gait Details: pt with strong posterior-L lean requiring mod-max A to prevent LOB backwards, difficulty coordinating BLE with steps requiring heavy assist to pivot feet for transfer, unable to take purposeful steps   Stairs             Wheelchair Mobility    Modified Rankin (Stroke Patients Only)       Balance Overall balance assessment: Needs assistance Sitting-balance support: Feet supported Sitting balance-Leahy Scale: Poor Sitting balance - Comments: seated EOB Postural control: Posterior lean Standing balance support: During functional activity;Bilateral upper extremity supported Standing balance-Leahy Scale: Poor Standing balance comment: mod A to mod A +2 due to strong posterior-L lean     Cognition Arousal/Alertness: Awake/alert Behavior During Therapy: WFL for tasks assessed/performed Overall Cognitive Status: Within Functional Limits for tasks assessed     Exercises General Exercises - Lower Extremity Long Arc Quad: Seated;AROM;Strengthening;Both;15 reps Hip Flexion/Marching: Seated;AROM;Strengthening;Both;15 reps    General Comments        Pertinent Vitals/Pain Pain Assessment: 0-10 Pain Score: 3  Pain Location: L hip Pain Descriptors / Indicators: Aching Pain Intervention(s): Limited activity within patient's tolerance;Monitored during session;Patient requesting pain meds-RN notified;Repositioned    Home Living                      Prior Function            PT Goals (current goals can now be found in the care plan section) Acute Rehab PT Goals  Patient Stated Goal: return home after rehab PT Goal Formulation: With patient Time For Goal Achievement: 10/09/20 Potential to Achieve Goals: Good Progress towards PT goals: Progressing toward goals    Frequency    Min 3X/week      PT Plan Current plan remains  appropriate    Co-evaluation              AM-PAC PT "6 Clicks" Mobility   Outcome Measure  Help needed turning from your back to your side while in a flat bed without using bedrails?: A Lot Help needed moving from lying on your back to sitting on the side of a flat bed without using bedrails?: A Lot Help needed moving to and from a bed to a chair (including a wheelchair)?: A Lot Help needed standing up from a chair using your arms (e.g., wheelchair or bedside chair)?: A Lot Help needed to walk in hospital room?: A Lot Help needed climbing 3-5 steps with a railing? : Total 6 Click Score: 11    End of Session Equipment Utilized During Treatment: Gait belt Activity Tolerance: Patient limited by fatigue Patient left: in bed;with call bell/phone within reach;with nursing/sitter in room;with family/visitor present Nurse Communication: Mobility status PT Visit Diagnosis: Unsteadiness on feet (R26.81);Other abnormalities of gait and mobility (R26.89);Muscle weakness (generalized) (M62.81)     Time: [id number] PT Time Calculation (min) (ACUTE ONLY): 23 min  Charges:  $Therapeutic Exercise: 8-22 mins $Therapeutic Activity: 8-22 mins                      Talbot Grumbling PT, DPT 09/26/20, 12:33 PM

## 2020-09-26 NOTE — Progress Notes (Signed)
PROGRESS NOTE    Vanessa Stephens  [id number] DOB: Oct 25, 1950 DOA: 09/24/2020 PCP: Glenda Chroman, MD   Chief Complaint  Patient presents with   Weakness    Brief admission Narrative:  As per H&P written by Dr. Clearence Ped on 09/24/20  Vanessa Stephens  is a 70 y.o. female, with history of DMII, HTN HLD, and more presents to the ED with a chief complaint of bilateral lower extremity weakness.  Patient reports that this started a month ago but has been progressively worse since it started.  This morning she could not even stand up.  She reports that it is normal for her to walk with a Rollator at baseline.  Lately her legs have felt so weak she felt unsafe even with a Rollator.  She reports she feels unsafe in the shower as well even with the stool to sit on in the shower.  The shower has 1 step that she has to lift her leg over, and she feels very unsteady with that.  She reports today she could not get out of bed without an assist.  This is new for her.  The husband had to come and get her out of bed to walker to the bathroom.  Patient reports that she has been totally incontinent of urine for 1 month.  She has a sensation that she is voiding, but cannot control it.  She has not had any loss of control of her bowels.  She denies any saddle anesthesia.  She does have back pain that feels like a dull ache going across the lower back from hip to hip.  She denies any sharp pains.  She had a fever when she got to the ER today, but does not think that she had a fever at home.  She has not had any dysuria, hematuria.  She wants to get back to the point where she can get herself out of bed, walked to the bathroom, and take a shower by herself.  She reports that she was able to do that 1 month ago.  She does report that she has had back pain since her back surgery in 2006.  She is also had fatigue since her back surgery in 2006.  She has no other complaints at this time.     Patient does not smoke, does  not drink regularly, does not use illicit drugs.  She is not not vaccinated for COVID.  She is DNR  Assessment & Plan: 1-Generalized weakness -with main affectation on left LE. -MRI demonstrating severe canal stenosis at multiple levels but no osteomyelitis or immediate red flags abnormalities that might end up requiring surgery. -We will provide treatment with 1 week of Decadron, continue the use of Neurontin and pursue physical therapy.  Patient will benefit of outpatient follow-up with neurosurgery. -PT service evaluation requested and recommendations given for a skilled nursing facility. -follow neurology service any further recommendations; B12 within normal limits. -continue tx for UTI and provide supportive care.  2-E. coli UTI -no dysuria currently -still reporting frequency. -follow culture sensitivity. -continue current antibiotics. -PRN antipyretics ordered  3-Heart murmur -Demonstrating mild aortic stenosis; recommendations given for repeat echo in 1 year.  4-Hyperlipidemia -continue statins   5-Diabetes mellitus type 2 in obese ([place]) -continue holding oal hypoglycemic agents -follow CBG's and adjust hypoglycemic regimen as needed -decadron given earlier causing some increase in sugar level, will add levemir to regimen  6-class 1 obesity -low calorie diet and portion control encouraged. -Body mass  index is 30.65 kg/m.  7-grade 1 diastolic dysfunction -Compensated -Continue to follow low-sodium diet, daily weights and strict I's and O's.    DVT prophylaxis: SCD's Code Status: DNR Family Communication: Patient's husband at bedside. Disposition:   Status is: Observation  Dispo: The patient is from: Home              Anticipated d/c is to: Skilled nursing facility              Patient currently is not medically stable to d/c.   Difficult to place patient No       Consultants:  None   Procedures:  See below for x-ray reports 2-D echo: 2D echo  demonstrating mild aortic stenosis; preserved ejection fraction and no wall motion abnormalities.  Grade 1 diastolic dysfunction appreciated.  Antimicrobials:  Rocephin    Subjective: No fever, no dysuria, no chest pain, no nausea, no vomiting.  Patient weak and deconditioned.  Objective: Vitals:   09/26/20 0012 09/26/20 0038 09/26/20 0421 09/26/20 0758  BP: (!) 177/62 (!) 143/63 (!) 147/59 (!) 174/69  Pulse: 84 95 90 88  Resp:   17   Temp:   98.2 F (36.8 C) 98.3 F (36.8 C)  TempSrc:   Oral Oral  SpO2:   95% 90%  Weight:      Height:        Intake/Output Summary (Last 24 hours) at 09/26/2020 1313 Last data filed at 09/26/2020 0946 Gross per 24 hour  Intake 150 ml  Output 300 ml  Net -150 ml   Filed Weights   09/24/20 1545 09/25/20 1220  Weight: 80.7 kg 81 kg    Examination: General exam: Alert, awake, oriented x 3, weak, deconditioned and complaining of lower back pain.  No fever, no chest pain, no shortness of breath.  Patient is afebrile. Respiratory system: Clear to auscultation. Respiratory effort normal.  No requiring oxygen supplementation. Cardiovascular system:RRR. No murmurs, rubs, gallops.  No JVD. Gastrointestinal system: Abdomen is nondistended, soft and nontender. No organomegaly or masses felt. Normal bowel sounds heard. Central nervous system: Alert and oriented.  Generalized weakness appreciated bilaterally (but mainly left lower extremity). Extremities: No cyanosis or clubbing. Skin: No petechiae. Psychiatry: Judgement and insight appear normal. Mood & affect appropriate.   Data Reviewed: I have personally reviewed following labs and imaging studies  CBC: Recent Labs  Lab 09/24/20 1610 09/25/20 0619  WBC 6.1 4.1  NEUTROABS 5.3 3.6  HGB 13.4 12.7  HCT 41.3 39.7  MCV 83.6 82.7  PLT 195 [id number]    Basic Metabolic Panel: Recent Labs  Lab 09/24/20 1610 09/25/20 0619 09/26/20 0558  NA 136 137 139  K 3.9 3.3* 3.4*  CL 104 103 105  CO2 '25 23  25  '$ GLUCOSE 174* 325* 156*  BUN '15 17 22  '$ CREATININE 0.82 0.75 0.76  CALCIUM 8.9 9.2 9.0  MG  --  1.7 2.0    GFR: Estimated Creatinine Clearance: 66.4 mL/min (by C-G formula based on SCr of 0.76 mg/dL).  Liver Function Tests: Recent Labs  Lab 09/24/20 1610 09/25/20 0619  AST 32 34  ALT 27 29  ALKPHOS 69 60  BILITOT 0.8 0.7  PROT 7.2 6.6  ALBUMIN 3.6 3.3*    CBG: Recent Labs  Lab 09/25/20 1135 09/25/20 1705 09/25/20 2251 09/26/20 0754 09/26/20 1121  GLUCAP 138* 140* 161* 131* 189*    Recent Results (from the past 240 hour(s))  Resp Panel by RT-PCR (Flu A&B, Covid) Nasopharyngeal Swab  Status: None   Collection Time: 09/24/20  4:27 PM   Specimen: Nasopharyngeal Swab; Nasopharyngeal(NP) swabs in vial transport medium  Result Value Ref Range Status   SARS Coronavirus 2 by RT PCR NEGATIVE NEGATIVE Final    Comment: (NOTE) SARS-CoV-2 target nucleic acids are NOT DETECTED.  The SARS-CoV-2 RNA is generally detectable in upper respiratory specimens during the acute phase of infection. The lowest concentration of SARS-CoV-2 viral copies this assay can detect is 138 copies/mL. A negative result does not preclude SARS-Cov-2 infection and should not be used as the sole basis for treatment or other patient management decisions. A negative result may occur with  improper specimen collection/handling, submission of specimen other than nasopharyngeal swab, presence of viral mutation(s) within the areas targeted by this assay, and inadequate number of viral copies(<138 copies/mL). A negative result must be combined with clinical observations, patient history, and epidemiological information. The expected result is Negative.  Fact Sheet for Patients:  EntrepreneurPulse.com.au  Fact Sheet for Healthcare Providers:  IncredibleEmployment.be  This test is no t yet approved or cleared by the [place] FDA and  has been authorized for  detection and/or diagnosis of SARS-CoV-2 by FDA under an Emergency Use Authorization (EUA). This EUA will remain  in effect (meaning this test can be used) for the duration of the COVID-19 declaration under Section 564(b)(1) of the Act, 21 U.S.C.section 360bbb-3(b)(1), unless the authorization is terminated  or revoked sooner.       Influenza A by PCR NEGATIVE NEGATIVE Final   Influenza B by PCR NEGATIVE NEGATIVE Final    Comment: (NOTE) The Xpert Xpress SARS-CoV-2/FLU/RSV plus assay is intended as an aid in the diagnosis of influenza from Nasopharyngeal swab specimens and should not be used as a sole basis for treatment. Nasal washings and aspirates are unacceptable for Xpert Xpress SARS-CoV-2/FLU/RSV testing.  Fact Sheet for Patients: EntrepreneurPulse.com.au  Fact Sheet for Healthcare Providers: IncredibleEmployment.be  This test is not yet approved or cleared by the [place] FDA and has been authorized for detection and/or diagnosis of SARS-CoV-2 by FDA under an Emergency Use Authorization (EUA). This EUA will remain in effect (meaning this test can be used) for the duration of the COVID-19 declaration under Section 564(b)(1) of the Act, 21 U.S.C. section 360bbb-3(b)(1), unless the authorization is terminated or revoked.  Performed at [facility], [address] [place], [place] [address]   Urine Culture     Status: Abnormal (Preliminary result)   Collection Time: 09/24/20  4:28 PM   Specimen: Urine, Catheterized  Result Value Ref Range Status   Specimen Description   Final    URINE, CATHETERIZED Performed at [facility], [address], [place], [place] [address]    Special Requests   Final    NONE Performed at [facility], [address], [place], [place] [address]    Culture (A)  Final    >=100,000 COLONIES/mL ESCHERICHIA COLI SUSCEPTIBILITIES TO FOLLOW Performed at [facility], [place] [address], [place],  [place] [address]    Report Status PENDING  Incomplete  Blood Culture (routine x 2)     Status: None (Preliminary result)   Collection Time: 09/24/20  4:41 PM   Specimen: Right Antecubital; Blood  Result Value Ref Range Status   Specimen Description   Final    RIGHT ANTECUBITAL BOTTLES DRAWN AEROBIC AND ANAEROBIC   Special Requests Blood Culture adequate volume  Final   Culture   Final    NO GROWTH 2 DAYS Performed  at [facility], [address], [place], [place] [address]    Report Status PENDING  Incomplete  Blood Culture (routine x 2)     Status: None (Preliminary result)   Collection Time: 09/24/20  4:43 PM   Specimen: BLOOD RIGHT WRIST  Result Value Ref Range Status   Specimen Description   Final    BLOOD RIGHT WRIST BOTTLES DRAWN AEROBIC AND ANAEROBIC   Special Requests Blood Culture adequate volume  Final   Culture   Final    NO GROWTH 2 DAYS Performed at [facility], [address] [place], [place] [address]    Report Status PENDING  Incomplete    Radiology Studies: CT LUMBAR SPINE W CONTRAST  Result Date: 09/24/2020 CLINICAL DATA:  Clinical suspicion for infection. Lower back pain vs infection suspected. pt fell about three weeks ago and was first told that she cracked her pelvis, but then was told that it wasn't broken, states that she is here for weakness in her lower extremities with some chronic back pain prior hx of surgeries last one in 2006 per patient. Hx of breast cancer, DM, HTN. EXAM: CT LUMBAR SPINE WITH CONTRAST TECHNIQUE: Multidetector CT imaging of the lumbar spine was performed with intravenous contrast administration. CONTRAST:  73m OMNIPAQUE IOHEXOL 350 MG/ML SOLN COMPARISON:  Lumbar MRI, 07/15/2019. FINDINGS: Segmentation: 5 lumbar type vertebrae. Alignment: No spondylolisthesis. Minor curvature, upper lumbar spine convex the left and lower lumbar spine convex to the right. Vertebrae: No fracture or bone lesion. Paraspinal and other soft tissues: No paraspinal  mass, inflammation or fluid collection. Disc levels: Posterior lumbar spine fusion, L4, L5 and S1, with well-positioned bilateral pedicle screws and intact interconnecting rods. Mature appearing bony fusion across the posterior elements associated with the orthopedic hardware. T12-L1: Mild loss of disc height. Endplate spurring anteriorly. No significant posterior disc bulging. No disc herniation. Mild facet degenerative change. No significant stenosis. L1-L2: Moderate loss of disc height. Disc bulging and endplate spurring. Mild to moderate bilateral facet degenerative change. Central spinal canal narrowed, 7 mm anterior to posterior. No convincing disc herniation. Moderate right neural foraminal narrowing. L2-L3: Mild to moderate loss of disc height. Mild diffuse disc bulging. Mild facet degenerative change. Mild central stenosis. Moderate bilateral neural foraminal narrowing. No convincing disc herniation. L3-L4: Marked loss of disc height. Endplate sclerosis, bulging and osteophytes. Moderate bilateral facet degenerative change. Mild central stenosis. Mild left neural foraminal narrowing. No convincing disc herniation. L4-L5 spinal canal contents not well evaluated. Spinal canal appears widely patent. No convincing mass or fluid collection. Mild left neural foraminal narrowing. L5-S1: Spinal canal contents not well visualized due to artifact from the orthopedic hardware. Spinal canal appears widely patent. No convincing fluid collection. No significant stenosis. IMPRESSION: 1. No fracture or acute finding. No abnormal enhancement or fluid collection or inflammatory change to suggest infection. No abnormalities the disc spaces to suggest discitis/osteomyelitis. 2. Status post posterior fusion, L4 through S1. Orthopedic hardware is well-seated. There is mature bone graft material along the posterior elements. 3. Degenerative changes throughout the visualized spine as detailed. No convincing disc herniation.  Electronically Signed   By: DLajean ManesM.D.   On: 09/24/2020 18:12   MR Lumbar Spine W Wo Contrast  Result Date: 09/25/2020 CLINICAL DATA:  Low back pain, progressive neurologic deficit EXAM: MRI LUMBAR SPINE WITHOUT AND WITH CONTRAST TECHNIQUE: Multiplanar and multiecho pulse sequences of the lumbar spine were obtained without and with intravenous contrast. CONTRAST:  165mGADAVIST GADOBUTROL 1 MMOL/ML IV SOLN  COMPARISON:  07/15/2019 MRI, 09/24/2020 CT FINDINGS: Segmentation:  Standard. Alignment:  Slight scoliotic curvature without static listhesis. Vertebrae: No acute fracture. No evidence of discitis. No suspicious marrow replacing bone lesion. Status post L4-S1 PLIF with solid bony fusion. Conus medullaris and cauda equina: Conus extends to the T12-L1 level. Conus appears normal. There is similar degree of bunching of the cauda equina nerve roots above the L2-3 level of stenosis. No evidence of arachnoiditis. Paraspinal and other soft tissues: No paraspinal inflammatory changes or fluid collections. No abnormal postcontrast enhancement. The visualized prevertebral structures demonstrate no acute findings. Disc levels: T12-L1: Mild annular disc bulge. Mild bilateral facet arthropathy. No foraminal or canal stenosis. Unchanged. L1-L2: Circumferential disc bulge with bilateral facet arthropathy and ligamentum flavum buckling. Findings result in moderate canal stenosis with moderate right and mild left foraminal stenosis. Unchanged. L2-L3: Circumferential disc bulge with bilateral facet arthropathy and ligamentum flavum buckling. Findings result in severe canal stenosis with moderate bilateral foraminal stenosis. Unchanged. L3-L4: Circumferential disc bulge with prominent bilateral facet arthropathy and ligamentum flavum buckling. Findings result in moderate to severe canal stenosis with moderate to severe left and mild-moderate right foraminal stenosis. Unchanged. L4-L5: Prior fusion.  Canal and foramina  remain patent. L5-S1: Prior fusion.  Canal and foramina remain patent. IMPRESSION: 1. No acute findings. Specifically, no evidence of discitis-osteomyelitis, as clinically questioned. 2. Severe canal stenosis at L2-L3 and moderate-to-severe canal stenosis at L3-L4. 3. Moderate canal stenosis at L1-L2. 4. Foraminal stenosis is moderate bilaterally at L2-L3 and moderate-to-severe on the left at L3-4. 5. Status post L4-S1 PLIF with solid bony fusion. No residual foraminal stenosis at these levels. Electronically Signed   By: Davina Poke D.O.   On: 09/25/2020 10:30   DG Chest Port 1 View  Result Date: 09/24/2020 CLINICAL DATA:  Fever and weakness.  Fell 3 weeks ago. EXAM: PORTABLE CHEST 1 VIEW COMPARISON:  08/22/2004 FINDINGS: Normal heart size and pulmonary vascularity. No focal airspace disease or consolidation in the lungs. No blunting of costophrenic angles. No pneumothorax. Mediastinal contours appear intact. Calcification of the aorta. Calcifications over the left chest likely in the soft tissues of the breast or axilla. Surgical clips in the left axilla. Degenerative changes in the spine and shoulders. IMPRESSION: No active disease. Electronically Signed   By: Lucienne Capers M.D.   On: 09/24/2020 17:45   ECHOCARDIOGRAM COMPLETE  Result Date: 09/25/2020    ECHOCARDIOGRAM REPORT   Patient Name:   ANOUK BILBERRY Date of Exam: 09/25/2020 Medical Rec #:  [id number]        Height:       64.0 in Accession #:    [id number]       Weight:       178.0 lb Date of Birth:  02/26/1950       BSA:          1.862 m Patient Age:    79 years         BP:           183/79 mmHg Patient Gender: F                HR:           88 bpm. Exam Location:  [facility] Procedure: 2D Echo, Cardiac Doppler and Color Doppler Indications:    Murmur  History:        Patient has no prior history of Echocardiogram examinations.  Signs/Symptoms:Murmur; Risk Factors:Diabetes and Dyslipidemia.  Sonographer:    Wenda Low Referring Phys: [id number] ASIA B [place]  1. Left ventricular ejection fraction, by estimation, is 60 to 65%. The left ventricle has normal function. The left ventricle has no regional wall motion abnormalities. Left ventricular diastolic parameters are consistent with Grade I diastolic dysfunction (impaired relaxation).  2. Right ventricular systolic function is normal. The right ventricular size is normal.  3. The mitral valve is normal in structure. No evidence of mitral valve regurgitation. No evidence of mitral stenosis.  4. Suggest f/u echo in a year for mild AS . The aortic valve is calcified. There is moderate calcification of the aortic valve. There is moderate thickening of the aortic valve. Aortic valve regurgitation is not visualized. Mild aortic valve stenosis.  5. The inferior vena cava is normal in size with greater than 50% respiratory variability, suggesting right atrial pressure of 3 mmHg. FINDINGS  Left Ventricle: Left ventricular ejection fraction, by estimation, is 60 to 65%. The left ventricle has normal function. The left ventricle has no regional wall motion abnormalities. The left ventricular internal cavity size was normal in size. There is  no left ventricular hypertrophy. Left ventricular diastolic parameters are consistent with Grade I diastolic dysfunction (impaired relaxation). Right Ventricle: The right ventricular size is normal. No increase in right ventricular wall thickness. Right ventricular systolic function is normal. Left Atrium: Left atrial size was normal in size. Right Atrium: Right atrial size was normal in size. Pericardium: There is no evidence of pericardial effusion. Mitral Valve: The mitral valve is normal in structure. No evidence of mitral valve regurgitation. No evidence of mitral valve stenosis. MV peak gradient, 5.1 mmHg. The mean mitral valve gradient is 2.0 mmHg. Tricuspid Valve: The tricuspid valve is normal in structure. Tricuspid  valve regurgitation is not demonstrated. No evidence of tricuspid stenosis. Aortic Valve: Suggest f/u echo in a year for mild AS. The aortic valve is calcified. There is moderate calcification of the aortic valve. There is moderate thickening of the aortic valve. Aortic valve regurgitation is not visualized. Mild aortic stenosis  is present. Aortic valve mean gradient measures 10.0 mmHg. Aortic valve peak gradient measures 18.4 mmHg. Aortic valve area, by VTI measures 1.76 cm. Pulmonic Valve: The pulmonic valve was normal in structure. Pulmonic valve regurgitation is not visualized. No evidence of pulmonic stenosis. Aorta: The aortic root is normal in size and structure. Venous: The inferior vena cava is normal in size with greater than 50% respiratory variability, suggesting right atrial pressure of 3 mmHg. IAS/Shunts: No atrial level shunt detected by color flow Doppler.  LEFT VENTRICLE PLAX 2D LVIDd:         3.56 cm  Diastology LVIDs:         2.45 cm  LV e' medial:    7.01 cm/s LV PW:         1.25 cm  LV E/e' medial:  7.8 LV IVS:        0.91 cm  LV e' lateral:   9.46 cm/s LVOT diam:     2.00 cm  LV E/e' lateral: 5.8 LV SV:         74 LV SV Index:   40 LVOT Area:     3.14 cm  RIGHT VENTRICLE RV Basal diam:  2.56 cm RV Mid diam:    1.97 cm RV S prime:     14.90 cm/s TAPSE (M-mode): 2.4 cm LEFT ATRIUM  Index       RIGHT ATRIUM           Index LA diam:        3.10 cm 1.67 cm/m  RA Area:     12.20 cm LA Vol (A2C):   34.6 ml 18.58 ml/m RA Volume:   24.50 ml  13.16 ml/m LA Vol (A4C):   36.2 ml 19.44 ml/m LA Biplane Vol: 37.4 ml 20.09 ml/m  AORTIC VALVE AV Area (Vmax):    1.44 cm AV Area (Vmean):   1.46 cm AV Area (VTI):     1.76 cm AV Vmax:           214.50 cm/s AV Vmean:          149.500 cm/s AV VTI:            0.419 m AV Peak Grad:      18.4 mmHg AV Mean Grad:      10.0 mmHg LVOT Vmax:         98.60 cm/s LVOT Vmean:        69.400 cm/s LVOT VTI:          0.235 m LVOT/AV VTI ratio: 0.56  AORTA Ao  Root diam: 2.70 cm Ao Asc diam:  3.10 cm MITRAL VALVE MV Area (PHT): 4.89 cm    SHUNTS MV Area VTI:   4.32 cm    Systemic VTI:  0.24 m MV Peak grad:  5.1 mmHg    Systemic Diam: 2.00 cm MV Mean grad:  2.0 mmHg MV Vmax:       1.13 m/s MV Vmean:      58.1 cm/s MV Decel Time: 155 msec MV E velocity: 54.40 cm/s MV A velocity: 96.80 cm/s MV E/A ratio:  0.56 Jenkins Rouge MD Electronically signed by Jenkins Rouge MD Signature Date/Time: 09/25/2020/10:44:00 AM    Final      Scheduled Meds:  dexamethasone  6 mg Oral Daily   heparin  5,000 Units Subcutaneous Q8H   lisinopril  20 mg Oral Daily   And   hydrochlorothiazide  12.5 mg Oral Daily   insulin aspart  0-15 Units Subcutaneous TID WC   insulin aspart  0-5 Units Subcutaneous QHS   insulin detemir  10 Units Subcutaneous Daily   metoprolol tartrate  50 mg Oral Daily   rosuvastatin  5 mg Oral Daily   Continuous Infusions:  cefTRIAXone (ROCEPHIN)  IV 1 g (09/25/20 1739)   methocarbamol (ROBAXIN) IV       LOS: 0 days    Time spent: 30 minutes   Barton Dubois, MD [facility]   To contact the attending provider between 7A-7P or the covering provider during after hours 7P-7A, please log into the web site [email or web address] and access using universal [place] password for that web site. If you do not have the password, please call the hospital operator.  09/26/2020, 1:13 PM

## 2020-09-27 LAB — RESP PANEL BY RT-PCR (FLU A&B, COVID) ARPGX2
Influenza A by PCR: NEGATIVE
Influenza B by PCR: NEGATIVE
SARS Coronavirus 2 by RT PCR: NEGATIVE

## 2020-09-27 LAB — GLUCOSE, CAPILLARY
Glucose-Capillary: 131 mg/dL — ABNORMAL HIGH (ref 70–99)
Glucose-Capillary: 180 mg/dL — ABNORMAL HIGH (ref 70–99)
Glucose-Capillary: 284 mg/dL — ABNORMAL HIGH (ref 70–99)
Glucose-Capillary: 189 mg/dL — ABNORMAL HIGH (ref 70–99)

## 2020-09-27 LAB — URINE CULTURE: Culture: >=100000 — AB

## 2020-09-27 MED ORDER — POLYETHYLENE GLYCOL 3350 17 G PO PACK
17.0000 g | PACK | Freq: Two times a day (BID) | ORAL | Status: DC
  Administered 2020-09-27 – 2020-09-28 (×3): 17 g via ORAL
  Filled 2020-09-27 (×3): qty 1

## 2020-09-27 MED ORDER — BISACODYL 10 MG RE SUPP
10.0000 mg | Freq: Once | RECTAL | Status: AC
  Administered 2020-09-27: 10 mg via RECTAL
  Filled 2020-09-27: qty 1

## 2020-09-27 MED ORDER — CEPHALEXIN 500 MG PO CAPS
500.0000 mg | ORAL_CAPSULE | Freq: Two times a day (BID) | ORAL | Status: DC
  Administered 2020-09-27 – 2020-09-28 (×3): 500 mg via ORAL
  Filled 2020-09-27 (×3): qty 1

## 2020-09-27 NOTE — Progress Notes (Signed)
PROGRESS NOTE    Vanessa Stephens  [id number] DOB: [birth year] DOA: 09/24/2020 PCP: Glenda Chroman, MD   Chief Complaint  Patient presents with   Weakness    Brief admission Narrative:  As per H&P written by Dr. Clearence Ped on 09/24/20  Vanessa Stephens  is a 70 y.o. female, with history of DMII, HTN HLD, and more presents to the ED with a chief complaint of bilateral lower extremity weakness.  Patient reports that this started a month ago but has been progressively worse since it started.  This morning she could not even stand up.  She reports that it is normal for her to walk with a Rollator at baseline.  Lately her legs have felt so weak she felt unsafe even with a Rollator.  She reports she feels unsafe in the shower as well even with the stool to sit on in the shower.  The shower has 1 step that she has to lift her leg over, and she feels very unsteady with that.  She reports today she could not get out of bed without an assist.  This is new for her.  The husband had to come and get her out of bed to walker to the bathroom.  Patient reports that she has been totally incontinent of urine for 1 month.  She has a sensation that she is voiding, but cannot control it.  She has not had any loss of control of her bowels.  She denies any saddle anesthesia.  She does have back pain that feels like a dull ache going across the lower back from hip to hip.  She denies any sharp pains.  She had a fever when she got to the ER today, but does not think that she had a fever at home.  She has not had any dysuria, hematuria.  She wants to get back to the point where she can get herself out of bed, walked to the bathroom, and take a shower by herself.  She reports that she was able to do that 1 month ago.  She does report that she has had back pain since her back surgery in 2006.  She is also had fatigue since her back surgery in 2006.  She has no other complaints at this time.     Patient does not smoke, does  not drink regularly, does not use illicit drugs.  She is not not vaccinated for COVID.  She is DNR  Assessment & Plan: 1-Generalized weakness -with main affectation on left LE. -MRI demonstrating severe canal stenosis at multiple levels but no osteomyelitis or immediate red flags abnormalities that might end up requiring surgery. -Okay to complete 1 week of Decadron, c -c/n Neurontin and   physical therapy.   --Patient will benefit of outpatient follow-up with neurosurgery (prior L4-S1-lumbar fusion) -Neurology input appreciated  2-E. coli UTI -Treated with Rocephin okay to switch to Keflex to complete 5 days of therapy  3-bilateral lower extremity swelling left more than right--- patient reports some left lower extremity discomfort as well get venous Dopplers  4-Hyperlipidemia -Stop  statins due to myalgias  5-Diabetes mellitus type 2 in obese (HCC) -continue holding oal hypoglycemic agents -follow CBG's and adjust hypoglycemic regimen as needed -decadron given earlier causing some increase in sugar level, will add levemir to regimen  6-class 1 obesity -low calorie diet and portion control encouraged. -Body mass index is 30.65 kg/m.  7-grade 1 diastolic dysfunction -Compensated -Continue to follow low-sodium diet, daily weights and  strict I's and O's. -Heart murmur -Echo with mild aortic stenosis;  repeat echo in 1 yea    DVT prophylaxis: SCD's Code Status: DNR Family Communication: Patient's husband at bedside. Disposition:   Status is: Observation  Dispo: The patient is from: Home              Anticipated d/c is to: Skilled nursing facility              Patient currently is not medically stable to d/c.   Difficult to place patient No     Consultants:  None   Procedures:  See below for x-ray reports 2-D echo: 2D echo demonstrating mild aortic stenosis; preserved ejection fraction and no wall motion abnormalities.  Grade 1 diastolic dysfunction  appreciated.  Antimicrobials:  Rocephin    Subjective: Complains of bilateral lower extremity swelling and discomfort especially on the left No fever  Or chills  -Husband at bedside, questions answered  Objective: Vitals:   09/26/20 2118 09/27/20 0431 09/27/20 0904 09/27/20 1500  BP: (!) 157/68 (!) 164/71 (!) 149/73 (!) 157/69  Pulse: 61 60 80 65  Resp: '14 16  18  '$ Temp: 99 F (37.2 C) 97.7 F (36.5 C)  98 F (36.7 C)  TempSrc:  Oral  Oral  SpO2: 96% 97% 96% 95%  Weight:      Height:        Intake/Output Summary (Last 24 hours) at 09/27/2020 1819 Last data filed at 09/27/2020 1500 Gross per 24 hour  Intake 1160 ml  Output 1300 ml  Net -140 ml   Filed Weights   09/24/20 1545 09/25/20 1220  Weight: 80.7 kg 81 kg    Examination:  Physical Exam  Gen:- Awake Alert,  in no apparent distress  HEENT:- [place].AT, No sclera icterus Neck-Supple Neck,No JVD,.  Lungs-  CTAB , fair air movement CV- S1, S2 normal Abd-  +ve B.Sounds, Abd Soft, No tenderness,    Extremity/Skin:-+1 pitting edema bilaterally left more than right, some discomfort on palpation over the lower left leg  psych-affect is appropriate, oriented x3 Neuro-generalized weakness no new focal deficits, no tremors   Data Reviewed: I have personally reviewed following labs and imaging studies  CBC: Recent Labs  Lab 09/24/20 1610 09/25/20 0619  WBC 6.1 4.1  NEUTROABS 5.3 3.6  HGB 13.4 12.7  HCT 41.3 39.7  MCV 83.6 82.7  PLT 195 [id number]    Basic Metabolic Panel: Recent Labs  Lab 09/24/20 1610 09/25/20 0619 09/26/20 0558  NA 136 137 139  K 3.9 3.3* 3.4*  CL 104 103 105  CO2 '25 23 25  '$ GLUCOSE 174* 325* 156*  BUN '15 17 22  '$ CREATININE 0.82 0.75 0.76  CALCIUM 8.9 9.2 9.0  MG  --  1.7 2.0    GFR: Estimated Creatinine Clearance: 66.4 mL/min (by C-G formula based on SCr of 0.76 mg/dL).  Liver Function Tests: Recent Labs  Lab 09/24/20 1610 09/25/20 0619  AST 32 34  ALT 27 29  ALKPHOS 69 60   BILITOT 0.8 0.7  PROT 7.2 6.6  ALBUMIN 3.6 3.3*    CBG: Recent Labs  Lab 09/26/20 1611 09/26/20 2123 09/27/20 0719 09/27/20 1112 09/27/20 1610  GLUCAP 117* 258* 131* 180* 284*    Recent Results (from the past 240 hour(s))  Resp Panel by RT-PCR (Flu A&B, Covid) Nasopharyngeal Swab     Status: None   Collection Time: 09/24/20  4:27 PM   Specimen: Nasopharyngeal Swab; Nasopharyngeal(NP) swabs in vial transport medium  Result Value Ref Range Status   SARS Coronavirus 2 by RT PCR NEGATIVE NEGATIVE Final    Comment: (NOTE) SARS-CoV-2 target nucleic acids are NOT DETECTED.  The SARS-CoV-2 RNA is generally detectable in upper respiratory specimens during the acute phase of infection. The lowest concentration of SARS-CoV-2 viral copies this assay can detect is 138 copies/mL. A negative result does not preclude SARS-Cov-2 infection and should not be used as the sole basis for treatment or other patient management decisions. A negative result may occur with  improper specimen collection/handling, submission of specimen other than nasopharyngeal swab, presence of viral mutation(s) within the areas targeted by this assay, and inadequate number of viral copies(<138 copies/mL). A negative result must be combined with clinical observations, patient history, and epidemiological information. The expected result is Negative.  Fact Sheet for Patients:  EntrepreneurPulse.com.au  Fact Sheet for Healthcare Providers:  IncredibleEmployment.be  This test is no t yet approved or cleared by the [place] FDA and  has been authorized for detection and/or diagnosis of SARS-CoV-2 by FDA under an Emergency Use Authorization (EUA). This EUA will remain  in effect (meaning this test can be used) for the duration of the COVID-19 declaration under Section 564(b)(1) of the Act, 21 U.S.C.section 360bbb-3(b)(1), unless the authorization is terminated  or revoked  sooner.       Influenza A by PCR NEGATIVE NEGATIVE Final   Influenza B by PCR NEGATIVE NEGATIVE Final    Comment: (NOTE) The Xpert Xpress SARS-CoV-2/FLU/RSV plus assay is intended as an aid in the diagnosis of influenza from Nasopharyngeal swab specimens and should not be used as a sole basis for treatment. Nasal washings and aspirates are unacceptable for Xpert Xpress SARS-CoV-2/FLU/RSV testing.  Fact Sheet for Patients: EntrepreneurPulse.com.au  Fact Sheet for Healthcare Providers: IncredibleEmployment.be  This test is not yet approved or cleared by the [place] FDA and has been authorized for detection and/or diagnosis of SARS-CoV-2 by FDA under an Emergency Use Authorization (EUA). This EUA will remain in effect (meaning this test can be used) for the duration of the COVID-19 declaration under Section 564(b)(1) of the Act, 21 U.S.C. section 360bbb-3(b)(1), unless the authorization is terminated or revoked.  Performed at [facility], [address] [place], [place] [address]   Urine Culture     Status: Abnormal   Collection Time: 09/24/20  4:28 PM   Specimen: Urine, Catheterized  Result Value Ref Range Status   Specimen Description   Final    URINE, CATHETERIZED Performed at [facility], [address], [place], [place] [address]    Special Requests   Final    NONE Performed at [facility], [address] [place], [place] [address]    Culture >=100,000 COLONIES/mL ESCHERICHIA COLI (A)  Final   Report Status 09/27/2020 FINAL  Final   Organism ID, Bacteria ESCHERICHIA COLI (A)  Final      Susceptibility   Escherichia coli - MIC*    AMPICILLIN <=2 SENSITIVE Sensitive     CEFAZOLIN <=4 SENSITIVE Sensitive     CEFEPIME <=0.12 SENSITIVE Sensitive     CEFTRIAXONE <=0.25 SENSITIVE Sensitive     CIPROFLOXACIN 1 SENSITIVE Sensitive     GENTAMICIN <=1 SENSITIVE Sensitive     IMIPENEM <=0.25 SENSITIVE Sensitive     NITROFURANTOIN  32 SENSITIVE Sensitive     TRIMETH/SULFA <=20 SENSITIVE Sensitive     AMPICILLIN/SULBACTAM <=2 SENSITIVE Sensitive     PIP/TAZO <=4 SENSITIVE Sensitive     * >=100,000 COLONIES/mL ESCHERICHIA COLI  Blood Culture (routine x 2)     Status: None (Preliminary result)   Collection Time: 09/24/20  4:41 PM   Specimen: Right Antecubital; Blood  Result Value Ref Range Status   Specimen Description   Final    RIGHT ANTECUBITAL BOTTLES DRAWN AEROBIC AND ANAEROBIC   Special Requests Blood Culture adequate volume  Final   Culture   Final    NO GROWTH 3 DAYS Performed at [facility], [address], [place], [place] [address]    Report Status PENDING  Incomplete  Blood Culture (routine x 2)     Status: None (Preliminary result)   Collection Time: 09/24/20  4:43 PM   Specimen: BLOOD RIGHT WRIST  Result Value Ref Range Status   Specimen Description   Final    BLOOD RIGHT WRIST BOTTLES DRAWN AEROBIC AND ANAEROBIC   Special Requests Blood Culture adequate volume  Final   Culture   Final    NO GROWTH 3 DAYS Performed at [facility], [address], [place], [place] [address]    Report Status PENDING  Incomplete  Resp Panel by RT-PCR (Flu A&B, Covid) Nasopharyngeal Swab     Status: None   Collection Time: 09/27/20  2:30 PM   Specimen: Nasopharyngeal Swab; Nasopharyngeal(NP) swabs in vial transport medium  Result Value Ref Range Status   SARS Coronavirus 2 by RT PCR NEGATIVE NEGATIVE Final    Comment: (NOTE) SARS-CoV-2 target nucleic acids are NOT DETECTED.  The SARS-CoV-2 RNA is generally detectable in upper respiratory specimens during the acute phase of infection. The lowest concentration of SARS-CoV-2 viral copies this assay can detect is 138 copies/mL. A negative result does not preclude SARS-Cov-2 infection and should not be used as the sole basis for treatment or other patient management decisions. A negative result may occur with  improper specimen collection/handling, submission of  specimen other than nasopharyngeal swab, presence of viral mutation(s) within the areas targeted by this assay, and inadequate number of viral copies(<138 copies/mL). A negative result must be combined with clinical observations, patient history, and epidemiological information. The expected result is Negative.  Fact Sheet for Patients:  EntrepreneurPulse.com.au  Fact Sheet for Healthcare Providers:  IncredibleEmployment.be  This test is no t yet approved or cleared by the [place] FDA and  has been authorized for detection and/or diagnosis of SARS-CoV-2 by FDA under an Emergency Use Authorization (EUA). This EUA will remain  in effect (meaning this test can be used) for the duration of the COVID-19 declaration under Section 564(b)(1) of the Act, 21 U.S.C.section 360bbb-3(b)(1), unless the authorization is terminated  or revoked sooner.       Influenza A by PCR NEGATIVE NEGATIVE Final   Influenza B by PCR NEGATIVE NEGATIVE Final    Comment: (NOTE) The Xpert Xpress SARS-CoV-2/FLU/RSV plus assay is intended as an aid in the diagnosis of influenza from Nasopharyngeal swab specimens and should not be used as a sole basis for treatment. Nasal washings and aspirates are unacceptable for Xpert Xpress SARS-CoV-2/FLU/RSV testing.  Fact Sheet for Patients: EntrepreneurPulse.com.au  Fact Sheet for Healthcare Providers: IncredibleEmployment.be  This test is not yet approved or cleared by the [place] FDA and has been authorized for detection and/or diagnosis of SARS-CoV-2 by FDA under an Emergency Use Authorization (EUA). This EUA will remain in effect (meaning this test can be used) for the duration of the COVID-19 declaration under Section 564(b)(1) of the Act, 21 U.S.C. section 360bbb-3(b)(1), unless the authorization is terminated or revoked.  Performed at  [facility], [address]  [place], [place] [address]     Radiology Studies: No results found.   Scheduled Meds:  cephALEXin  500 mg Oral Q12H   dexamethasone  6 mg Oral Daily   heparin  5,000 Units Subcutaneous Q8H   lisinopril  20 mg Oral Daily   And   hydrochlorothiazide  12.5 mg Oral Daily   insulin aspart  0-15 Units Subcutaneous TID WC   insulin aspart  0-5 Units Subcutaneous QHS   insulin detemir  10 Units Subcutaneous Daily   metoprolol tartrate  50 mg Oral Daily   polyethylene glycol  17 g Oral BID   rosuvastatin  5 mg Oral Daily   Continuous Infusions:  methocarbamol (ROBAXIN) IV       LOS: 1 day   Roxan Hockey, MD [facility]   To contact the attending provider between 7A-7P or the covering provider during after hours 7P-7A, please log into the web site [email or web address] and access using universal [place] password for that web site. If you do not have the password, please call the hospital operator.  09/27/2020, 6:19 PM

## 2020-09-28 ENCOUNTER — Inpatient Hospital Stay
Admission: RE | Admit: 2020-09-28 | Discharge: 2020-10-17 | Disposition: A | Discharge status: Home / Self Care | Payer: Medicare HMO | Source: Ambulatory Visit | Attending: Internal Medicine | Admitting: Internal Medicine

## 2020-09-28 ENCOUNTER — Inpatient Hospital Stay (HOSPITAL_COMMUNITY): Payer: Medicare HMO

## 2020-09-28 DIAGNOSIS — R011 Cardiac murmur, unspecified: Secondary | ICD-10-CM | POA: Diagnosis not present

## 2020-09-28 DIAGNOSIS — M79604 Pain in right leg: Secondary | ICD-10-CM | POA: Diagnosis not present

## 2020-09-28 DIAGNOSIS — I5032 Chronic diastolic (congestive) heart failure: Secondary | ICD-10-CM | POA: Diagnosis not present

## 2020-09-28 DIAGNOSIS — M6281 Muscle weakness (generalized): Secondary | ICD-10-CM | POA: Diagnosis not present

## 2020-09-28 DIAGNOSIS — Z741 Need for assistance with personal care: Secondary | ICD-10-CM | POA: Diagnosis not present

## 2020-09-28 DIAGNOSIS — N390 Urinary tract infection, site not specified: Secondary | ICD-10-CM | POA: Diagnosis not present

## 2020-09-28 DIAGNOSIS — M4807 Spinal stenosis, lumbosacral region: Secondary | ICD-10-CM | POA: Diagnosis present

## 2020-09-28 DIAGNOSIS — E1159 Type 2 diabetes mellitus with other circulatory complications: Secondary | ICD-10-CM | POA: Diagnosis not present

## 2020-09-28 DIAGNOSIS — M1710 Unilateral primary osteoarthritis, unspecified knee: Secondary | ICD-10-CM | POA: Diagnosis not present

## 2020-09-28 DIAGNOSIS — R531 Weakness: Secondary | ICD-10-CM | POA: Diagnosis not present

## 2020-09-28 DIAGNOSIS — R2681 Unsteadiness on feet: Secondary | ICD-10-CM | POA: Diagnosis not present

## 2020-09-28 DIAGNOSIS — I152 Hypertension secondary to endocrine disorders: Secondary | ICD-10-CM | POA: Diagnosis not present

## 2020-09-28 DIAGNOSIS — E1169 Type 2 diabetes mellitus with other specified complication: Secondary | ICD-10-CM | POA: Diagnosis not present

## 2020-09-28 DIAGNOSIS — I110 Hypertensive heart disease with heart failure: Secondary | ICD-10-CM | POA: Diagnosis not present

## 2020-09-28 DIAGNOSIS — M79605 Pain in left leg: Secondary | ICD-10-CM | POA: Diagnosis not present

## 2020-09-28 DIAGNOSIS — E669 Obesity, unspecified: Secondary | ICD-10-CM | POA: Diagnosis not present

## 2020-09-28 DIAGNOSIS — R600 Localized edema: Secondary | ICD-10-CM | POA: Diagnosis not present

## 2020-09-28 DIAGNOSIS — R262 Difficulty in walking, not elsewhere classified: Secondary | ICD-10-CM | POA: Diagnosis not present

## 2020-09-28 DIAGNOSIS — E876 Hypokalemia: Secondary | ICD-10-CM | POA: Diagnosis not present

## 2020-09-28 DIAGNOSIS — N3945 Continuous leakage: Secondary | ICD-10-CM | POA: Diagnosis not present

## 2020-09-28 DIAGNOSIS — K5909 Other constipation: Secondary | ICD-10-CM | POA: Diagnosis not present

## 2020-09-28 DIAGNOSIS — E119 Type 2 diabetes mellitus without complications: Secondary | ICD-10-CM | POA: Diagnosis not present

## 2020-09-28 DIAGNOSIS — I10 Essential (primary) hypertension: Secondary | ICD-10-CM | POA: Diagnosis not present

## 2020-09-28 DIAGNOSIS — M48061 Spinal stenosis, lumbar region without neurogenic claudication: Secondary | ICD-10-CM | POA: Diagnosis not present

## 2020-09-28 DIAGNOSIS — E785 Hyperlipidemia, unspecified: Secondary | ICD-10-CM | POA: Diagnosis not present

## 2020-09-28 DIAGNOSIS — E039 Hypothyroidism, unspecified: Secondary | ICD-10-CM | POA: Diagnosis not present

## 2020-09-28 LAB — COMPREHENSIVE METABOLIC PANEL
ALT: 40 U/L (ref 0–44)
AST: 34 U/L (ref 15–41)
Albumin: 3.1 g/dL — ABNORMAL LOW (ref 3.5–5.0)
Alkaline Phosphatase: 66 U/L (ref 38–126)
Anion gap: 11 (ref 5–15)
BUN: 23 mg/dL (ref 8–23)
CO2: 26 mmol/L (ref 22–32)
Calcium: 9.3 mg/dL (ref 8.9–10.3)
Chloride: 101 mmol/L (ref 98–111)
Creatinine, Ser: 0.75 mg/dL (ref 0.44–1.00)
GFR, Estimated: >60 mL/min (ref >60)
Glucose, Bld: 120 mg/dL — ABNORMAL HIGH (ref 70–99)
Potassium: 3.4 mmol/L — ABNORMAL LOW (ref 3.5–5.1)
Sodium: 138 mmol/L (ref 135–145)
Total Bilirubin: 0.5 mg/dL (ref 0.3–1.2)
Total Protein: 6.5 g/dL (ref 6.5–8.1)

## 2020-09-28 LAB — GLUCOSE, CAPILLARY
Glucose-Capillary: 99 mg/dL (ref 70–99)
Glucose-Capillary: 179 mg/dL — ABNORMAL HIGH (ref 70–99)
Glucose-Capillary: 206 mg/dL — ABNORMAL HIGH (ref 70–99)

## 2020-09-28 LAB — CK: Total CK: 42 U/L (ref 38–234)

## 2020-09-28 MED ORDER — INSULIN ASPART 100 UNIT/ML FLEXPEN: 0.0000 [IU] | PEN_INJECTOR | Freq: Three times a day (TID) | SUBCUTANEOUS | 11 refills | Status: DC

## 2020-09-28 MED ORDER — POTASSIUM CHLORIDE CRYS ER 20 MEQ PO TBCR
40.0000 meq | EXTENDED_RELEASE_TABLET | Freq: Once | ORAL | Status: AC
  Administered 2020-09-28: 40 meq via ORAL
  Filled 2020-09-28: qty 2

## 2020-09-28 MED ORDER — OXYCODONE-ACETAMINOPHEN 5-325 MG PO TABS: 1.0000 | ORAL_TABLET | Freq: Three times a day (TID) | ORAL | 0 refills | Status: DC | PRN

## 2020-09-28 MED ORDER — PREDNISONE 20 MG PO TABS: 40.0000 mg | ORAL_TABLET | ORAL | 0 refills | Status: DC

## 2020-09-28 MED ORDER — GLIPIZIDE 5 MG PO TABS: 5.0000 mg | ORAL_TABLET | Freq: Two times a day (BID) | ORAL | 1 refills | Status: DC

## 2020-09-28 MED ORDER — POTASSIUM CHLORIDE ER 10 MEQ PO TBCR: 10.0000 meq | EXTENDED_RELEASE_TABLET | Freq: Every day | ORAL | 2 refills | Status: DC

## 2020-09-28 MED ORDER — CEPHALEXIN 500 MG PO CAPS: 500.0000 mg | ORAL_CAPSULE | Freq: Two times a day (BID) | ORAL | 0 refills | Status: DC

## 2020-09-28 MED ORDER — SENNOSIDES-DOCUSATE SODIUM 8.6-50 MG PO TABS: 2.0000 | ORAL_TABLET | Freq: Every day | ORAL | 4 refills | Status: AC

## 2020-09-28 MED ORDER — METHOCARBAMOL 500 MG PO TABS
500.0000 mg | ORAL_TABLET | Freq: Four times a day (QID) | ORAL | 0 refills | Status: DC
Start: 2020-09-28 — End: 2020-10-16

## 2020-09-28 MED ORDER — INSULIN DETEMIR 100 UNIT/ML ~~LOC~~ SOLN: 10.0000 [IU] | Freq: Every day | SUBCUTANEOUS | 11 refills | Status: DC

## 2020-09-28 MED ORDER — POLYETHYLENE GLYCOL 3350 17 G PO PACK: 17.0000 g | PACK | Freq: Every day | ORAL | 2 refills | Status: DC

## 2020-09-28 MED ORDER — ACETAMINOPHEN 325 MG PO TABS: 650.0000 mg | ORAL_TABLET | Freq: Four times a day (QID) | ORAL | 0 refills | Status: DC | PRN

## 2020-09-28 NOTE — TOC Transition Note (Signed)
Transition of Care [facility]) - CM/SW Discharge Note   Patient Details  Name: Vanessa Stephens MRN: [id number] Date of Birth: [birth year]  [facility]) CM/SW Contact:  Natasha Bence, LCSW Phone Number: 09/28/2020, 2:57 PM   Clinical Narrative:    MD provided peer to peer for authorization. Auth approved. Kerri at [facility] agreeable to take patient. Nurse to call report. TOC signing off.    Final next level of care: Skilled Nursing Facility Barriers to Discharge: Barriers Resolved   Patient Goals and CMS Choice Patient states their goals for this hospitalization and ongoing recovery are:: Rehab with SNF CMS Medicare.gov Compare Post Acute Care list provided to:: Patient Choice offered to / list presented to : Patient  Discharge Placement              Patient chooses bed at: [facility] Patient to be transferred to facility by: [facility] Name of family member notified: Vanessa Stephens, Vanessa Stephens (Spouse)   [phone] Patient and family notified of of transfer: 09/28/20  Discharge Plan and Services In-house Referral: Clinical Social Work   Post Acute Care Choice: [place]                               Social Determinants of Health (SDOH) Interventions     Readmission Risk Interventions No flowsheet data found.

## 2020-09-28 NOTE — Progress Notes (Signed)
Physical Therapy Treatment Patient Details Name: Vanessa Stephens MRN: [id number] DOB: [birth year] Today's Date: 09/28/2020    History of Present Illness Vanessa Stephens is a 70 y.o. female presents to the ED with a chief complaint of bilateral lower extremity weakness. PMH: DMII, HTN HLD    PT Comments    Patient agreeable to participating in therapy session today and would like to stand up and get into chair. Bed mobility, transfers and ambulation performed with less posterior lean and required less assistance today as detailed in mobility section below. Patient continues to need assistance for all mobility. Patient performed therapeutic exercises in supine and seated with good effort and verbal and gestural cues for completion.  Patient would continue to benefit from skilled physical therapy in current environment and next venue to continue return to prior function and increase strength, endurance, balance, coordination, and functional mobility and gait skills.    Follow Up Recommendations  SNF     Equipment Recommendations  None recommended by PT    Recommendations for Other Services       Precautions / Restrictions Precautions Precautions: Fall Restrictions Weight Bearing Restrictions: No    Mobility  Bed Mobility Overal bed mobility: Needs Assistance Bed Mobility: Supine to Sit Rolling: Supervision Sidelying to sit: Supervision;HOB elevated;Min assist       General bed mobility comments: increased time, use of bedrails; terminal assist of trunk to attain full upright position    Transfers Overall transfer level: Needs assistance Equipment used: Rolling walker (2 wheeled)   Sit to Stand: Min guard Stand pivot transfers: Min assist;Mod assist       General transfer comment: verbal and gestural cues for sequencing of steps and placement of hands for use of RW; leaning posteriorly in seated and more significantly in standing; in seated able to provide verbal/tactile  cues and patient able to correct lean; in standing patient required assistance to remain upright.  Ambulation/Gait Ambulation/Gait assistance: Min assist;Mod assist Gait Distance (Feet): 4 Feet Assistive device: Rolling walker (2 wheeled) Gait Pattern/deviations: Step-to pattern;Decreased step length - right;Decreased step length - left;Decreased stance time - right;Decreased stance time - left;Decreased weight shift to right;Decreased weight shift to left;Decreased stride length Gait velocity: decreased   General Gait Details: slow. labored cadence using RW with dificulty clearing foot for sidestepping along bedside and pivoting to chair but much improved over last session; on room air; limited by fatigue   Stairs             Wheelchair Mobility    Modified Rankin (Stroke Patients Only)       Balance Overall balance assessment: Needs assistance Sitting-balance support: Feet supported Sitting balance-Leahy Scale: Poor Sitting balance - Comments: seated EOB but improved Postural control: Posterior lean Standing balance support: During functional activity;Bilateral upper extremity supported Standing balance-Leahy Scale: Poor Standing balance comment: min/mod A due to posterior lean                 Cognition Arousal/Alertness: Awake/alert Behavior During Therapy: WFL for tasks assessed/performed Overall Cognitive Status: Within Functional Limits for tasks assessed     Exercises General Exercises - Upper Extremity Shoulder Flexion: Strengthening;Both;10 reps;Seated Chair Push Up: Strengthening;Both;10 reps;Seated General Exercises - Lower Extremity Long Arc Quad: Seated;AROM;Strengthening;Both;15 reps Hip Flexion/Marching: Seated;AROM;Strengthening;Both;15 reps Toe Raises: Strengthening;AROM;Both;10 reps;Supine Heel Raises: AROM;Strengthening;Both;10 reps;Supine Other Exercises Other Exercises: ab sit up in chair to upright position x10 with min assist at  end/forward range    General Comments  Pertinent Vitals/Pain Pain Assessment: No/denies pain    Home Living                      Prior Function            PT Goals (current goals can now be found in the care plan section) Acute Rehab PT Goals Patient Stated Goal: return home after rehab PT Goal Formulation: With patient Time For Goal Achievement: 10/09/20 Potential to Achieve Goals: Good Progress towards PT goals: Progressing toward goals    Frequency    Min 3X/week      PT Plan Current plan remains appropriate       AM-PAC PT "6 Clicks" Mobility   Outcome Measure  Help needed turning from your back to your side while in a flat bed without using bedrails?: A Little Help needed moving from lying on your back to sitting on the side of a flat bed without using bedrails?: A Little Help needed moving to and from a bed to a chair (including a wheelchair)?: A Lot Help needed standing up from a chair using your arms (e.g., wheelchair or bedside chair)?: A Little Help needed to walk in hospital room?: A Lot Help needed climbing 3-5 steps with a railing? : Total 6 Click Score: 14    End of Session Equipment Utilized During Treatment: Gait belt Activity Tolerance: Patient limited by fatigue Patient left: with call bell/phone within reach;in chair;with chair alarm set Nurse Communication: Mobility status PT Visit Diagnosis: Unsteadiness on feet (R26.81);Other abnormalities of gait and mobility (R26.89);Muscle weakness (generalized) (M62.81)     Time: 1330-1400 PT Time Calculation (min) (ACUTE ONLY): 30 min  Charges:  $Therapeutic Activity: 8-22 mins                     [name], MS, PT Per [place] [phone]  [name]  [name] 09/28/2020, 2:14 PM

## 2020-09-28 NOTE — Discharge Summary (Addendum)
Vanessa Stephens, is a 70 y.o. female  DOB January 06, 1951  MRN [id number].  Admission date:  09/24/2020  Admitting Physician  Rolla Plate, DO  Discharge Date:  09/28/2020   Primary MD  Glenda Chroman, MD  Recommendations for primary care physician for things to follow:   1)Avoid ibuprofen/Advil/Aleve/Motrin/Goody Powders/Naproxen/BC powders/Meloxicam/Diclofenac/Indomethacin and other Nonsteroidal anti-inflammatory medications as these will make you more likely to bleed and can cause stomach ulcers, can also cause Kidney problems.   2)Follow up with neurosurgeon Dr. Eustace Moore, MD at [facility] in 1 to 2 weeks Address: [address], [place], [place] [address] Phone: [phone]  3)insulin aspart (novoLOG) injection 0-10 Units  0-10 Units Subcutaneous, 3 times daily with meals  CBG < 70: Implement Hypoglycemia Standing Orders and refer to Hypoglycemia Standing Orders sidebar report   CBG 70 - 120: 0 unit CBG 121 - 150: 0 unit  CBG 151 - 200: 1 unit  CBG 201 - 250: 2 units  CBG 251 - 300: 4 units  CBG 301 - 350: 6 units   CBG 351 - 400: 8 units  CBG > 400: 10 units   Admission Diagnosis  Generalized weakness [R53.1] Febrile urinary tract infection [N39.0]   Discharge Diagnosis  Generalized weakness [R53.1] Febrile urinary tract infection [N39.0]    Principal Problem:   Spinal stenosis of lumbosacral region/Severe canal stenosis at L2-L3 and moderate-to-severe canal stenosis at L3-L4/ Active Problems:   Heart murmur   Generalized weakness   Diabetes mellitus type 2 in obese (HCC)   Acute lower UTI due to E Coli   Hyperlipidemia      Past Medical History:  Diagnosis Date   Cancer ([place])    Diabetes mellitus without complication ([place])    Hypertension     Past Surgical History:  Procedure Laterality Date   BACK SURGERY     BREAST LUMPECTOMY      CHOLECYSTECTOMY       HPI  from the history and physical done on the day of admission:    Vanessa Stephens  is a 70 y.o. female, with history of DMII, HTN HLD, and more presents to the ED with a chief complaint of bilateral lower extremity weakness.  Patient reports that this started a month ago but has been progressively worse since it started.  This morning she could not even stand up.  She reports that it is normal for her to walk with a Rollator at baseline.  Lately her legs have felt so weak she felt unsafe even with a Rollator.  She reports she feels unsafe in the shower as well even with the stool to sit on in the shower.  The shower has 1 step that she has to lift her leg over, and she feels very unsteady with that.  She reports today she could not get out of bed without an assist.  This is new for her.  The husband had to come and get her out of bed  to walker to the bathroom.  Patient reports that she has been totally incontinent of urine for 1 month.  She has a sensation that she is voiding, but cannot control it.  She has not had any loss of control of her bowels.  She denies any saddle anesthesia.  She does have back pain that feels like a dull ache going across the lower back from hip to hip.  She denies any sharp pains.  She had a fever when she got to the ER today, but does not think that she had a fever at home.  She has not had any dysuria, hematuria.  She wants to get back to the point where she can get herself out of bed, walked to the bathroom, and take a shower by herself.  She reports that she was able to do that 1 month ago.  She does report that she has had back pain since her back surgery in 2006.  She is also had fatigue since her back surgery in 2006.  She has no other complaints at this time.     Patient does not smoke, does not drink regularly, does not use illicit drugs.  She is not not vaccinated for COVID.  She is DNR   In the ED Temperature 101.1, after Tylenol 99.8,  respiratory rate 16, blood pressure 160/70, satting 95% on room air, heart rate 85-97 UA is indicative of UTI, urine culture pending CT lumbar spine with contrast shows no fracture or acute finding.  Status post fusion of L4-S1.  Degenerative disc disease -without convincing herniation. Blood culture pending White blood cell count 6.1, hemoglobin 13.4 Chemistry panel is unremarkable Negative respiratory panel Patient is unsafe to go home, does have a fever here, is interested in rehab, so admission was requested to monitor and help set her up with rehab.     Hospital Course:    Assessment & Plan:  1)Generalized weakness -with main affectation on left LE. -MRI demonstrating severe canal stenosis at multiple levels but no osteomyelitis or immediate red flags abnormalities that might end up requiring surgery. -Okay to taper steroids  -c/n Neurontin, methocarbamol as needed oxycodone and   physical therapy.   --Patient will benefit of outpatient follow-up with neurosurgery (prior L4-S1-lumbar fusion) -Outpatient follow-up with Dr. Sherley Bounds at [facility] advised in 1 to 2 weeks -Neurology input appreciated -No fecal incontinence, urinary symptoms may be related to E. coli UTI -No saddle anesthesia   2)-E. coli UTI -Treated with Rocephin okay to switch to Keflex to complete 5 days of therapy -If urinary symptoms including difficulty with bladder control persist after completion of Keflex consider if urinary symptoms are related to spinal stenosis   3)-bilateral lower extremity swelling left more than right--- venous Dopplers negative for DVT -TED stockings advised   4)-Hyperlipidemia -CK is only 42, okay to restart statins    5)-Diabetes mellitus type 2 in obese (HCC) -Sugars running higher due to steroid -Continue metformin, glipizide and Lantus insulin as well as sliding scale coverage Use Novolog/Humalog Sliding scale insulin with Accu-Cheks/Fingersticks as ordered     6)-class 1 obesity -low calorie diet and portion control encouraged. -Body mass index is 30.65 kg/m.   7)-Grade 1 diastolic dysfunction/Heart Murmur -Compensated -Continue to follow low-sodium diet, daily weights and strict I's and O's. -Heart murmur -Echo with mild aortic stenosis;  repeat echo in 1 year  8) constipation--suspect this is due to opiates, muscle relaxants and immobility -Had BM with laxatives -  Code Status: DNR Family Communication: Patient's husband at bedside. Disposition:    Dispo: The patient is from: Home              Anticipated d/c is to: Skilled nursing facility                Consultants:  None    Procedures:  See below for x-ray reports 2-D echo: 2D echo demonstrating mild aortic stenosis; preserved ejection fraction and no wall motion abnormalities.  Grade 1 diastolic dysfunction appreciated.   Antimicrobials:  Rocephin >>keflex    Discharge Condition: stable  Follow UP   Contact information for follow-up providers     Eustace Moore, MD. Schedule an appointment as soon as possible for a visit in 1 week(s).   Specialty: Neurosurgery Why: Spinal Stenosis Contact information: [address] [place] [address] [phone]              Contact information for after-discharge care     [facility] .   Service: Skilled Nursing Contact information: [address] [place] [address] [phone]                      Diet and Activity recommendation:  As advised  Discharge Instructions    Discharge Instructions     Call MD for:  difficulty breathing, headache or visual disturbances   Complete by: As directed    Call MD for:  persistant dizziness or light-headedness   Complete by: As directed    Call MD for:  persistant nausea and vomiting   Complete by: As directed    Call MD for:  temperature >100.4   Complete by: As  directed    Diet - low sodium heart healthy   Complete by: As directed    Discharge instructions   Complete by: As directed    1)Avoid ibuprofen/Advil/Aleve/Motrin/Goody Powders/Naproxen/BC powders/Meloxicam/Diclofenac/Indomethacin and other Nonsteroidal anti-inflammatory medications as these will make you more likely to bleed and can cause stomach ulcers, can also cause Kidney problems.   2)Follow up with neurosurgeon Dr. Eustace Moore, MD at [facility] in 1 to 2 weeks Address: [address], [place], [place] [address] Phone: [phone]  3)insulin aspart (novoLOG) injection 0-10 Units  0-10 Units Subcutaneous, 3 times daily with meals  CBG < 70: Implement Hypoglycemia Standing Orders and refer to Hypoglycemia Standing Orders sidebar report   CBG 70 - 120: 0 unit CBG 121 - 150: 0 unit  CBG 151 - 200: 1 unit  CBG 201 - 250: 2 units  CBG 251 - 300: 4 units  CBG 301 - 350: 6 units   CBG 351 - 400: 8 units  CBG > 400: 10 units   Increase activity slowly   Complete by: As directed        Discharge Medications     Allergies as of 09/28/2020       Reactions   Codeine Nausea And Vomiting   Patient stated she was not allergic to codeine anymore         Medication List     STOP taking these medications    meloxicam 15 MG tablet Commonly known as: MOBIC       TAKE these medications    acetaminophen 325 MG tablet Commonly known as: TYLENOL Take 2 tablets (650 mg total) by mouth every 6 (six) hours as needed  for mild pain (or Fever >/= 101).   cephALEXin 500 MG capsule Commonly known as: KEFLEX Take 1 capsule (500 mg total) by mouth 2 (two) times daily.   DULoxetine 60 MG capsule Commonly known as: CYMBALTA Take 60 mg by mouth daily.   glipiZIDE 5 MG tablet Commonly known as: GLUCOTROL Take 1 tablet (5 mg total) by mouth 2 (two) times daily before a meal. What changed:  medication strength how much to take when to take  this   insulin aspart 100 UNIT/ML FlexPen Commonly known as: NOVOLOG Inject 0-10 Units into the skin 3 (three) times daily with meals. insulin aspart (novoLOG) injection 0-10 Units 0-10 Units Subcutaneous, 3 times daily with meals CBG < 70: Implement Hypoglycemia Standing Orders and refer to Hypoglycemia Standing Orders sidebar report  CBG 70 - 120: 0 unit CBG 121 - 150: 0 unit  CBG 151 - 200: 1 unit CBG 201 - 250: 2 units CBG 251 - 300: 4 units CBG 301 - 350: 6 units  CBG 351 - 400: 8 units  CBG > 400: 10 units   insulin detemir 100 UNIT/ML injection Commonly known as: LEVEMIR Inject 0.1 mLs (10 Units total) into the skin daily. Start taking on: September 29, 2020   lisinopril-hydrochlorothiazide 20-12.5 MG tablet Commonly known as: ZESTORETIC Take 1 tablet by mouth daily.   metFORMIN 500 MG tablet Commonly known as: GLUCOPHAGE Take 1,000 mg by mouth in the morning and at bedtime.   methocarbamol 500 MG tablet Commonly known as: Robaxin Take 1 tablet (500 mg total) by mouth 4 (four) times daily.   metoprolol tartrate 50 MG tablet Commonly known as: LOPRESSOR Take 50 mg by mouth daily.   OVER THE COUNTER MEDICATION Take 1 tablet by mouth in the morning and at bedtime. Homeopathic pain medication   oxyCODONE-acetaminophen 5-325 MG tablet Commonly known as: PERCOCET/ROXICET Take 1 tablet by mouth every 8 (eight) hours as needed for moderate pain or severe pain. What changed:  when to take this reasons to take this   polyethylene glycol 17 g packet Commonly known as: MIRALAX / GLYCOLAX Take 17 g by mouth daily.   potassium chloride 10 MEQ tablet Commonly known as: KLOR-CON Take 1 tablet (10 mEq total) by mouth daily. Take While taking HCTZ   predniSONE 20 MG tablet Commonly known as: DELTASONE Take 2 tablets (40 mg total) by mouth See admin instructions for 5 days. Take Prednisone 2 tab ( 40 mg) daily with Breakfast for 5 days, then 1 tab ('20mg'$  ) daily for 5 days then STOP    rosuvastatin 5 MG tablet Commonly known as: CRESTOR Take 5 mg by mouth daily.   senna-docusate 8.6-50 MG tablet Commonly known as: Senokot-S Take 2 tablets by mouth at bedtime.        Major procedures and Radiology Reports - PLEASE review detailed and final reports for all details, in brief -    CT LUMBAR SPINE W CONTRAST  Result Date: 09/24/2020 CLINICAL DATA:  Clinical suspicion for infection. Lower back pain vs infection suspected. pt fell about three weeks ago and was first told that she cracked her pelvis, but then was told that it wasn't broken, states that she is here for weakness in her lower extremities with some chronic back pain prior hx of surgeries last one in 2006 per patient. Hx of breast cancer, DM, HTN. EXAM: CT LUMBAR SPINE WITH CONTRAST TECHNIQUE: Multidetector CT imaging of the lumbar spine was performed with intravenous contrast administration. CONTRAST:  46m OMNIPAQUE IOHEXOL 350 MG/ML SOLN COMPARISON:  Lumbar MRI, 07/15/2019. FINDINGS: Segmentation: 5 lumbar type vertebrae. Alignment: No spondylolisthesis. Minor curvature, upper lumbar spine convex the left and lower lumbar spine convex to the right. Vertebrae: No fracture or bone lesion. Paraspinal and other soft tissues: No paraspinal mass, inflammation or fluid collection. Disc levels: Posterior lumbar spine fusion, L4, L5 and S1, with well-positioned bilateral pedicle screws and intact interconnecting rods. Mature appearing bony fusion across the posterior elements associated with the orthopedic hardware. T12-L1: Mild loss of disc height. Endplate spurring anteriorly. No significant posterior disc bulging. No disc herniation. Mild facet degenerative change. No significant stenosis. L1-L2: Moderate loss of disc height. Disc bulging and endplate spurring. Mild to moderate bilateral facet degenerative change. Central spinal canal narrowed, 7 mm anterior to posterior. No convincing disc herniation. Moderate right neural  foraminal narrowing. L2-L3: Mild to moderate loss of disc height. Mild diffuse disc bulging. Mild facet degenerative change. Mild central stenosis. Moderate bilateral neural foraminal narrowing. No convincing disc herniation. L3-L4: Marked loss of disc height. Endplate sclerosis, bulging and osteophytes. Moderate bilateral facet degenerative change. Mild central stenosis. Mild left neural foraminal narrowing. No convincing disc herniation. L4-L5 spinal canal contents not well evaluated. Spinal canal appears widely patent. No convincing mass or fluid collection. Mild left neural foraminal narrowing. L5-S1: Spinal canal contents not well visualized due to artifact from the orthopedic hardware. Spinal canal appears widely patent. No convincing fluid collection. No significant stenosis. IMPRESSION: 1. No fracture or acute finding. No abnormal enhancement or fluid collection or inflammatory change to suggest infection. No abnormalities the disc spaces to suggest discitis/osteomyelitis. 2. Status post posterior fusion, L4 through S1. Orthopedic hardware is well-seated. There is mature bone graft material along the posterior elements. 3. Degenerative changes throughout the visualized spine as detailed. No convincing disc herniation. Electronically Signed   By: DLajean ManesM.D.   On: 09/24/2020 18:12   MR Lumbar Spine W Wo Contrast  Result Date: 09/25/2020 CLINICAL DATA:  Low back pain, progressive neurologic deficit EXAM: MRI LUMBAR SPINE WITHOUT AND WITH CONTRAST TECHNIQUE: Multiplanar and multiecho pulse sequences of the lumbar spine were obtained without and with intravenous contrast. CONTRAST:  16mGADAVIST GADOBUTROL 1 MMOL/ML IV SOLN COMPARISON:  07/15/2019 MRI, 09/24/2020 CT FINDINGS: Segmentation:  Standard. Alignment:  Slight scoliotic curvature without static listhesis. Vertebrae: No acute fracture. No evidence of discitis. No suspicious marrow replacing bone lesion. Status post L4-S1 PLIF with solid bony  fusion. Conus medullaris and cauda equina: Conus extends to the T12-L1 level. Conus appears normal. There is similar degree of bunching of the cauda equina nerve roots above the L2-3 level of stenosis. No evidence of arachnoiditis. Paraspinal and other soft tissues: No paraspinal inflammatory changes or fluid collections. No abnormal postcontrast enhancement. The visualized prevertebral structures demonstrate no acute findings. Disc levels: T12-L1: Mild annular disc bulge. Mild bilateral facet arthropathy. No foraminal or canal stenosis. Unchanged. L1-L2: Circumferential disc bulge with bilateral facet arthropathy and ligamentum flavum buckling. Findings result in moderate canal stenosis with moderate right and mild left foraminal stenosis. Unchanged. L2-L3: Circumferential disc bulge with bilateral facet arthropathy and ligamentum flavum buckling. Findings result in severe canal stenosis with moderate bilateral foraminal stenosis. Unchanged. L3-L4: Circumferential disc bulge with prominent bilateral facet arthropathy and ligamentum flavum buckling. Findings result in moderate to severe canal stenosis with moderate to severe left and mild-moderate right foraminal stenosis. Unchanged. L4-L5: Prior fusion.  Canal and foramina remain patent. L5-S1: Prior fusion.  Canal and foramina  remain patent. IMPRESSION: 1. No acute findings. Specifically, no evidence of discitis-osteomyelitis, as clinically questioned. 2. Severe canal stenosis at L2-L3 and moderate-to-severe canal stenosis at L3-L4. 3. Moderate canal stenosis at L1-L2. 4. Foraminal stenosis is moderate bilaterally at L2-L3 and moderate-to-severe on the left at L3-4. 5. Status post L4-S1 PLIF with solid bony fusion. No residual foraminal stenosis at these levels. Electronically Signed   By: Davina Poke D.O.   On: 09/25/2020 10:30   US Venous Img Lower Bilateral (DVT)  Result Date: 09/28/2020 CLINICAL DATA:  Bilateral lower extremity pain and edema. EXAM:  BILATERAL LOWER EXTREMITY VENOUS DOPPLER ULTRASOUND TECHNIQUE: Gray-scale sonography with compression, as well as color and duplex ultrasound, were performed to evaluate the deep venous system(s) from the level of the common femoral vein through the popliteal and proximal calf veins. COMPARISON:  None. FINDINGS: VENOUS Normal compressibility of the common femoral, superficial femoral, and popliteal veins, as well as the visualized calf veins. Visualized portions of profunda femoral vein and great saphenous vein unremarkable. No filling defects to suggest DVT on grayscale or color Doppler imaging. Doppler waveforms show normal direction of venous flow, normal respiratory plasticity and response to augmentation. OTHER None. Limitations: none IMPRESSION: No lower extremity DVT. Electronically Signed   By: Miachel Roux M.D.   On: 09/28/2020 09:58   DG Chest Port 1 View  Result Date: 09/24/2020 CLINICAL DATA:  Fever and weakness.  Fell 3 weeks ago. EXAM: PORTABLE CHEST 1 VIEW COMPARISON:  08/22/2004 FINDINGS: Normal heart size and pulmonary vascularity. No focal airspace disease or consolidation in the lungs. No blunting of costophrenic angles. No pneumothorax. Mediastinal contours appear intact. Calcification of the aorta. Calcifications over the left chest likely in the soft tissues of the breast or axilla. Surgical clips in the left axilla. Degenerative changes in the spine and shoulders. IMPRESSION: No active disease. Electronically Signed   By: Lucienne Capers M.D.   On: 09/24/2020 17:45   ECHOCARDIOGRAM COMPLETE  Result Date: 09/25/2020    ECHOCARDIOGRAM REPORT   Patient Name:   Vanessa Stephens Date of Exam: 09/25/2020 Medical Rec #:  [id number]        Height:       64.0 in Accession #:    [id number]       Weight:       178.0 lb Date of Birth:  1950/08/02       BSA:          1.862 m Patient Age:    25 years         BP:           183/79 mmHg Patient Gender: F                HR:           88 bpm. Exam  Location:  [facility] Procedure: 2D Echo, Cardiac Doppler and Color Doppler Indications:    Murmur  History:        Patient has no prior history of Echocardiogram examinations.                 Signs/Symptoms:Murmur; Risk Factors:Diabetes and Dyslipidemia.  Sonographer:    Wenda Low Referring Phys: [id number] ASIA B [place]  1. Left ventricular ejection fraction, by estimation, is 60 to 65%. The left ventricle has normal function. The left ventricle has no regional wall motion abnormalities. Left ventricular diastolic parameters are consistent with Grade I diastolic dysfunction (impaired relaxation).  2. Right ventricular systolic function  is normal. The right ventricular size is normal.  3. The mitral valve is normal in structure. No evidence of mitral valve regurgitation. No evidence of mitral stenosis.  4. Suggest f/u echo in a year for mild AS . The aortic valve is calcified. There is moderate calcification of the aortic valve. There is moderate thickening of the aortic valve. Aortic valve regurgitation is not visualized. Mild aortic valve stenosis.  5. The inferior vena cava is normal in size with greater than 50% respiratory variability, suggesting right atrial pressure of 3 mmHg. FINDINGS  Left Ventricle: Left ventricular ejection fraction, by estimation, is 60 to 65%. The left ventricle has normal function. The left ventricle has no regional wall motion abnormalities. The left ventricular internal cavity size was normal in size. There is  no left ventricular hypertrophy. Left ventricular diastolic parameters are consistent with Grade I diastolic dysfunction (impaired relaxation). Right Ventricle: The right ventricular size is normal. No increase in right ventricular wall thickness. Right ventricular systolic function is normal. Left Atrium: Left atrial size was normal in size. Right Atrium: Right atrial size was normal in size. Pericardium: There is no evidence of pericardial effusion.  Mitral Valve: The mitral valve is normal in structure. No evidence of mitral valve regurgitation. No evidence of mitral valve stenosis. MV peak gradient, 5.1 mmHg. The mean mitral valve gradient is 2.0 mmHg. Tricuspid Valve: The tricuspid valve is normal in structure. Tricuspid valve regurgitation is not demonstrated. No evidence of tricuspid stenosis. Aortic Valve: Suggest f/u echo in a year for mild AS. The aortic valve is calcified. There is moderate calcification of the aortic valve. There is moderate thickening of the aortic valve. Aortic valve regurgitation is not visualized. Mild aortic stenosis  is present. Aortic valve mean gradient measures 10.0 mmHg. Aortic valve peak gradient measures 18.4 mmHg. Aortic valve area, by VTI measures 1.76 cm. Pulmonic Valve: The pulmonic valve was normal in structure. Pulmonic valve regurgitation is not visualized. No evidence of pulmonic stenosis. Aorta: The aortic root is normal in size and structure. Venous: The inferior vena cava is normal in size with greater than 50% respiratory variability, suggesting right atrial pressure of 3 mmHg. IAS/Shunts: No atrial level shunt detected by color flow Doppler.  LEFT VENTRICLE PLAX 2D LVIDd:         3.56 cm  Diastology LVIDs:         2.45 cm  LV e' medial:    7.01 cm/s LV PW:         1.25 cm  LV E/e' medial:  7.8 LV IVS:        0.91 cm  LV e' lateral:   9.46 cm/s LVOT diam:     2.00 cm  LV E/e' lateral: 5.8 LV SV:         74 LV SV Index:   40 LVOT Area:     3.14 cm  RIGHT VENTRICLE RV Basal diam:  2.56 cm RV Mid diam:    1.97 cm RV S prime:     14.90 cm/s TAPSE (M-mode): 2.4 cm LEFT ATRIUM             Index       RIGHT ATRIUM           Index LA diam:        3.10 cm 1.67 cm/m  RA Area:     12.20 cm LA Vol (A2C):   34.6 ml 18.58 ml/m RA Volume:   24.50 ml  13.16 ml/m  LA Vol (A4C):   36.2 ml 19.44 ml/m LA Biplane Vol: 37.4 ml 20.09 ml/m  AORTIC VALVE AV Area (Vmax):    1.44 cm AV Area (Vmean):   1.46 cm AV Area (VTI):      1.76 cm AV Vmax:           214.50 cm/s AV Vmean:          149.500 cm/s AV VTI:            0.419 m AV Peak Grad:      18.4 mmHg AV Mean Grad:      10.0 mmHg LVOT Vmax:         98.60 cm/s LVOT Vmean:        69.400 cm/s LVOT VTI:          0.235 m LVOT/AV VTI ratio: 0.56  AORTA Ao Root diam: 2.70 cm Ao Asc diam:  3.10 cm MITRAL VALVE MV Area (PHT): 4.89 cm    SHUNTS MV Area VTI:   4.32 cm    Systemic VTI:  0.24 m MV Peak grad:  5.1 mmHg    Systemic Diam: 2.00 cm MV Mean grad:  2.0 mmHg MV Vmax:       1.13 m/s MV Vmean:      58.1 cm/s MV Decel Time: 155 msec MV E velocity: 54.40 cm/s MV A velocity: 96.80 cm/s MV E/A ratio:  0.56 Jenkins Rouge MD Electronically signed by Jenkins Rouge MD Signature Date/Time: 09/25/2020/10:44:00 AM    Final     Micro Results   Recent Results (from the past 240 hour(s))  Resp Panel by RT-PCR (Flu A&B, Covid) Nasopharyngeal Swab     Status: None   Collection Time: 09/24/20  4:27 PM   Specimen: Nasopharyngeal Swab; Nasopharyngeal(NP) swabs in vial transport medium  Result Value Ref Range Status   SARS Coronavirus 2 by RT PCR NEGATIVE NEGATIVE Final    Comment: (NOTE) SARS-CoV-2 target nucleic acids are NOT DETECTED.  The SARS-CoV-2 RNA is generally detectable in upper respiratory specimens during the acute phase of infection. The lowest concentration of SARS-CoV-2 viral copies this assay can detect is 138 copies/mL. A negative result does not preclude SARS-Cov-2 infection and should not be used as the sole basis for treatment or other patient management decisions. A negative result may occur with  improper specimen collection/handling, submission of specimen other than nasopharyngeal swab, presence of viral mutation(s) within the areas targeted by this assay, and inadequate number of viral copies(<138 copies/mL). A negative result must be combined with clinical observations, patient history, and epidemiological information. The expected result is Negative.  Fact  Sheet for Patients:  EntrepreneurPulse.com.au  Fact Sheet for Healthcare Providers:  IncredibleEmployment.be  This test is no t yet approved or cleared by the [place] FDA and  has been authorized for detection and/or diagnosis of SARS-CoV-2 by FDA under an Emergency Use Authorization (EUA). This EUA will remain  in effect (meaning this test can be used) for the duration of the COVID-19 declaration under Section 564(b)(1) of the Act, 21 U.S.C.section 360bbb-3(b)(1), unless the authorization is terminated  or revoked sooner.       Influenza A by PCR NEGATIVE NEGATIVE Final   Influenza B by PCR NEGATIVE NEGATIVE Final    Comment: (NOTE) The Xpert Xpress SARS-CoV-2/FLU/RSV plus assay is intended as an aid in the diagnosis of influenza from Nasopharyngeal swab specimens and should not be used as a sole basis for treatment. Nasal washings and aspirates are unacceptable  for Xpert Xpress SARS-CoV-2/FLU/RSV testing.  Fact Sheet for Patients: EntrepreneurPulse.com.au  Fact Sheet for Healthcare Providers: IncredibleEmployment.be  This test is not yet approved or cleared by the [place] FDA and has been authorized for detection and/or diagnosis of SARS-CoV-2 by FDA under an Emergency Use Authorization (EUA). This EUA will remain in effect (meaning this test can be used) for the duration of the COVID-19 declaration under Section 564(b)(1) of the Act, 21 U.S.C. section 360bbb-3(b)(1), unless the authorization is terminated or revoked.  Performed at [facility], [address], [place], [place] [address]   Urine Culture     Status: Abnormal   Collection Time: 09/24/20  4:28 PM   Specimen: Urine, Catheterized  Result Value Ref Range Status   Specimen Description   Final    URINE, CATHETERIZED Performed at [facility], [address], [place], [place] [address]    Special Requests   Final     NONE Performed at [facility], [address] [place], [place] [address]    Culture >=100,000 COLONIES/mL ESCHERICHIA COLI (A)  Final   Report Status 09/27/2020 FINAL  Final   Organism ID, Bacteria ESCHERICHIA COLI (A)  Final      Susceptibility   Escherichia coli - MIC*    AMPICILLIN <=2 SENSITIVE Sensitive     CEFAZOLIN <=4 SENSITIVE Sensitive     CEFEPIME <=0.12 SENSITIVE Sensitive     CEFTRIAXONE <=0.25 SENSITIVE Sensitive     CIPROFLOXACIN 1 SENSITIVE Sensitive     GENTAMICIN <=1 SENSITIVE Sensitive     IMIPENEM <=0.25 SENSITIVE Sensitive     NITROFURANTOIN 32 SENSITIVE Sensitive     TRIMETH/SULFA <=20 SENSITIVE Sensitive     AMPICILLIN/SULBACTAM <=2 SENSITIVE Sensitive     PIP/TAZO <=4 SENSITIVE Sensitive     * >=100,000 COLONIES/mL ESCHERICHIA COLI  Blood Culture (routine x 2)     Status: None (Preliminary result)   Collection Time: 09/24/20  4:41 PM   Specimen: Right Antecubital; Blood  Result Value Ref Range Status   Specimen Description   Final    RIGHT ANTECUBITAL BOTTLES DRAWN AEROBIC AND ANAEROBIC   Special Requests Blood Culture adequate volume  Final   Culture   Final    NO GROWTH 4 DAYS Performed at [facility], [address], [place], [place] [address]    Report Status PENDING  Incomplete  Blood Culture (routine x 2)     Status: None (Preliminary result)   Collection Time: 09/24/20  4:43 PM   Specimen: BLOOD RIGHT WRIST  Result Value Ref Range Status   Specimen Description   Final    BLOOD RIGHT WRIST BOTTLES DRAWN AEROBIC AND ANAEROBIC   Special Requests Blood Culture adequate volume  Final   Culture   Final    NO GROWTH 4 DAYS Performed at [facility], [address], [place], [place] [address]    Report Status PENDING  Incomplete  Resp Panel by RT-PCR (Flu A&B, Covid) Nasopharyngeal Swab     Status: None   Collection Time: 09/27/20  2:30 PM   Specimen: Nasopharyngeal Swab; Nasopharyngeal(NP) swabs in vial transport medium  Result Value Ref Range  Status   SARS Coronavirus 2 by RT PCR NEGATIVE NEGATIVE Final    Comment: (NOTE) SARS-CoV-2 target nucleic acids are NOT DETECTED.  The SARS-CoV-2 RNA is generally detectable in upper respiratory specimens during the acute phase of infection. The lowest concentration of SARS-CoV-2 viral copies this assay can detect is 138 copies/mL. A negative result does not preclude  SARS-Cov-2 infection and should not be used as the sole basis for treatment or other patient management decisions. A negative result may occur with  improper specimen collection/handling, submission of specimen other than nasopharyngeal swab, presence of viral mutation(s) within the areas targeted by this assay, and inadequate number of viral copies(<138 copies/mL). A negative result must be combined with clinical observations, patient history, and epidemiological information. The expected result is Negative.  Fact Sheet for Patients:  EntrepreneurPulse.com.au  Fact Sheet for Healthcare Providers:  IncredibleEmployment.be  This test is no t yet approved or cleared by the [place] FDA and  has been authorized for detection and/or diagnosis of SARS-CoV-2 by FDA under an Emergency Use Authorization (EUA). This EUA will remain  in effect (meaning this test can be used) for the duration of the COVID-19 declaration under Section 564(b)(1) of the Act, 21 U.S.C.section 360bbb-3(b)(1), unless the authorization is terminated  or revoked sooner.       Influenza A by PCR NEGATIVE NEGATIVE Final   Influenza B by PCR NEGATIVE NEGATIVE Final    Comment: (NOTE) The Xpert Xpress SARS-CoV-2/FLU/RSV plus assay is intended as an aid in the diagnosis of influenza from Nasopharyngeal swab specimens and should not be used as a sole basis for treatment. Nasal washings and aspirates are unacceptable for Xpert Xpress SARS-CoV-2/FLU/RSV testing.  Fact Sheet for  Patients: EntrepreneurPulse.com.au  Fact Sheet for Healthcare Providers: IncredibleEmployment.be  This test is not yet approved or cleared by the [place] FDA and has been authorized for detection and/or diagnosis of SARS-CoV-2 by FDA under an Emergency Use Authorization (EUA). This EUA will remain in effect (meaning this test can be used) for the duration of the COVID-19 declaration under Section 564(b)(1) of the Act, 21 U.S.C. section 360bbb-3(b)(1), unless the authorization is terminated or revoked.  Performed at [facility], [address], [place], [place] [address]     Today   Subjective    Vanessa Stephens today has no new complaints, -Had BM, Urinary symptoms improving -Bilateral leg weakness slightly improving--- able to get from bed to chair with therapist          Patient has been seen and examined prior to discharge   Objective   Blood pressure (!) 149/64, pulse 60, temperature 97.9 F (36.6 C), temperature source Oral, resp. rate 17, height '5\' 4"'$  (1.626 m), weight 81 kg, SpO2 93 %.   Intake/Output Summary (Last 24 hours) at 09/28/2020 1634 Last data filed at 09/28/2020 1321 Gross per 24 hour  Intake 1160 ml  Output --  Net 1160 ml    Exam Gen:- Awake Alert, no acute distress  HEENT:- [place].AT, No sclera icterus Neck-Supple Neck,No JVD,.  Lungs-  CTAB , good air movement bilaterally  CV- S1, S2 normal, regular, 3/6 SM Abd-  +ve B.Sounds, Abd Soft, No tenderness,    Extremity/Skin:- 1 +ve edema,   good pulses Psych-affect is appropriate, oriented x3 Neuro-generalized weakness especially bilateral lower extremity weakness left more than right no new focal deficits, no tremors  -No Saddle anesthesia GU/MSK--No fecal incontinence, urinary symptoms may be related to E. coli UTI, -No saddle anesthesia   Data Review   CBC w Diff:  Lab Results  Component Value Date   WBC 4.1 09/25/2020   HGB 12.7 09/25/2020   HCT  39.7 09/25/2020   PLT 185 09/25/2020   LYMPHOPCT 8 09/25/2020   MONOPCT 3 09/25/2020   EOSPCT 0 09/25/2020   BASOPCT 1 09/25/2020   CMP:  Lab Results  Component Value Date   NA 138 09/28/2020   K 3.4 (L) 09/28/2020   CL 101 09/28/2020   CO2 26 09/28/2020   BUN 23 09/28/2020   CREATININE 0.75 09/28/2020   PROT 6.5 09/28/2020   ALBUMIN 3.1 (L) 09/28/2020   BILITOT 0.5 09/28/2020   ALKPHOS 66 09/28/2020   AST 34 09/28/2020   ALT 40 09/28/2020  .   Total Discharge time is about 33 minutes  Roxan Hockey M.D on 09/28/2020 at 4:34 PM  Go to [email or web address] -  for contact info  [facility] - Office  [phone]

## 2020-09-28 NOTE — Discharge Instructions (Signed)
1)Avoid ibuprofen/Advil/Aleve/Motrin/Goody Powders/Naproxen/BC powders/Meloxicam/Diclofenac/Indomethacin and other Nonsteroidal anti-inflammatory medications as these will make you more likely to bleed and can cause stomach ulcers, can also cause Kidney problems.   2)Follow up with neurosurgeon Dr. Eustace Moore, MD at [facility] in 1 to 2 weeks Address: [address], [place], [place] [address] Phone: [phone]  3)insulin aspart (novoLOG) injection 0-10 Units  0-10 Units Subcutaneous, 3 times daily with meals  CBG < 70: Implement Hypoglycemia Standing Orders and refer to Hypoglycemia Standing Orders sidebar report   CBG 70 - 120: 0 unit CBG 121 - 150: 0 unit  CBG 151 - 200: 1 unit  CBG 201 - 250: 2 units  CBG 251 - 300: 4 units  CBG 301 - 350: 6 units   CBG 351 - 400: 8 units  CBG > 400: 10 units

## 2020-09-28 NOTE — Progress Notes (Signed)
Nsg Discharge Note  Admit Date:  09/24/2020 Discharge date: 09/28/2020   Lorraine Lax to be D/C'd Skilled nursing facility per MD order.  AVS completed.  Copy for chart, and copy for patient signed, and dated. Patient/caregiver able to verbalize understanding.  Discharge Medication: Allergies as of 09/28/2020       Reactions   Codeine Nausea And Vomiting   Patient stated she was not allergic to codeine anymore         Medication List     STOP taking these medications    meloxicam 15 MG tablet Commonly known as: MOBIC       TAKE these medications    acetaminophen 325 MG tablet Commonly known as: TYLENOL Take 2 tablets (650 mg total) by mouth every 6 (six) hours as needed for mild pain (or Fever >/= 101).   cephALEXin 500 MG capsule Commonly known as: KEFLEX Take 1 capsule (500 mg total) by mouth 2 (two) times daily.   DULoxetine 60 MG capsule Commonly known as: CYMBALTA Take 60 mg by mouth daily.   glipiZIDE 5 MG tablet Commonly known as: GLUCOTROL Take 1 tablet (5 mg total) by mouth 2 (two) times daily before a meal. What changed:  medication strength how much to take when to take this   insulin aspart 100 UNIT/ML FlexPen Commonly known as: NOVOLOG Inject 0-10 Units into the skin 3 (three) times daily with meals. insulin aspart (novoLOG) injection 0-10 Units 0-10 Units Subcutaneous, 3 times daily with meals CBG < 70: Implement Hypoglycemia Standing Orders and refer to Hypoglycemia Standing Orders sidebar report  CBG 70 - 120: 0 unit CBG 121 - 150: 0 unit  CBG 151 - 200: 1 unit CBG 201 - 250: 2 units CBG 251 - 300: 4 units CBG 301 - 350: 6 units  CBG 351 - 400: 8 units  CBG > 400: 10 units   insulin detemir 100 UNIT/ML injection Commonly known as: LEVEMIR Inject 0.1 mLs (10 Units total) into the skin daily. Start taking on: September 29, 2020   lisinopril-hydrochlorothiazide 20-12.5 MG tablet Commonly known as: ZESTORETIC Take 1 tablet by mouth daily.    metFORMIN 500 MG tablet Commonly known as: GLUCOPHAGE Take 1,000 mg by mouth in the morning and at bedtime.   methocarbamol 500 MG tablet Commonly known as: Robaxin Take 1 tablet (500 mg total) by mouth 4 (four) times daily.   metoprolol tartrate 50 MG tablet Commonly known as: LOPRESSOR Take 50 mg by mouth daily.   OVER THE COUNTER MEDICATION Take 1 tablet by mouth in the morning and at bedtime. Homeopathic pain medication   oxyCODONE-acetaminophen 5-325 MG tablet Commonly known as: PERCOCET/ROXICET Take 1 tablet by mouth every 8 (eight) hours as needed for moderate pain or severe pain. What changed:  when to take this reasons to take this   polyethylene glycol 17 g packet Commonly known as: MIRALAX / GLYCOLAX Take 17 g by mouth daily.   potassium chloride 10 MEQ tablet Commonly known as: KLOR-CON Take 1 tablet (10 mEq total) by mouth daily. Take While taking HCTZ   predniSONE 20 MG tablet Commonly known as: DELTASONE Take 2 tablets (40 mg total) by mouth See admin instructions for 5 days. Take Prednisone 2 tab ( 40 mg) daily with Breakfast for 5 days, then 1 tab ('20mg'$  ) daily for 5 days then STOP   rosuvastatin 5 MG tablet Commonly known as: CRESTOR Take 5 mg by mouth daily.   senna-docusate 8.6-50 MG tablet Commonly  known as: Senokot-S Take 2 tablets by mouth at bedtime.        Discharge Assessment: Vitals:   09/28/20 0338 09/28/20 1309  BP: (!) 172/73 (!) 149/64  Pulse: 61 60  Resp: 19 17  Temp: 98.7 F (37.1 C) 97.9 F (36.6 C)  SpO2: 96% 93%   Skin clean, dry and intact without evidence of skin break down, no evidence of skin tears noted. IV catheter discontinued intact. Site without signs and symptoms of complications - no redness or edema noted at insertion site, patient denies c/o pain - only slight tenderness at site.  Dressing with slight pressure applied.  D/c Instructions-Education: Discharge instructions given to patient/family with  verbalized understanding. D/c education completed with patient/family including follow up instructions, medication list, d/c activities limitations if indicated, with other d/c instructions as indicated by MD - patient able to verbalize understanding, all questions fully answered. Patient instructed to return to ED, call 911, or call MD for any changes in condition.  Patient escorted via [place], and D/C home via private auto.  Dorcas Mcmurray, LPN [phone] PM

## 2020-09-29 ENCOUNTER — Encounter: Payer: Self-pay | Admitting: Adult Health

## 2020-09-29 ENCOUNTER — Non-Acute Institutional Stay (SKILLED_NURSING_FACILITY): Payer: Medicare HMO | Admitting: Adult Health

## 2020-09-29 DIAGNOSIS — M4807 Spinal stenosis, lumbosacral region: Secondary | ICD-10-CM

## 2020-09-29 DIAGNOSIS — K5909 Other constipation: Secondary | ICD-10-CM | POA: Diagnosis not present

## 2020-09-29 DIAGNOSIS — R531 Weakness: Secondary | ICD-10-CM

## 2020-09-29 DIAGNOSIS — E1169 Type 2 diabetes mellitus with other specified complication: Secondary | ICD-10-CM

## 2020-09-29 DIAGNOSIS — E876 Hypokalemia: Secondary | ICD-10-CM | POA: Diagnosis not present

## 2020-09-29 DIAGNOSIS — E785 Hyperlipidemia, unspecified: Secondary | ICD-10-CM | POA: Diagnosis not present

## 2020-09-29 DIAGNOSIS — E1159 Type 2 diabetes mellitus with other circulatory complications: Secondary | ICD-10-CM | POA: Diagnosis not present

## 2020-09-29 DIAGNOSIS — I152 Hypertension secondary to endocrine disorders: Secondary | ICD-10-CM

## 2020-09-29 DIAGNOSIS — N390 Urinary tract infection, site not specified: Secondary | ICD-10-CM

## 2020-09-29 DIAGNOSIS — N3945 Continuous leakage: Secondary | ICD-10-CM

## 2020-09-29 DIAGNOSIS — E669 Obesity, unspecified: Secondary | ICD-10-CM

## 2020-09-29 LAB — CULTURE, BLOOD (ROUTINE X 2)
Culture: NO GROWTH
Culture: NO GROWTH
Special Requests: ADEQUATE
Special Requests: ADEQUATE

## 2020-09-29 NOTE — Progress Notes (Signed)
Location:  [place] Room Number: 131-P Place of Service:  SNF (31)   CODE STATUS: DNR  Allergies  Allergen Reactions   Codeine Nausea And Vomiting    Patient stated she was not allergic to codeine anymore     Chief Complaint  Patient presents with   Hospitalization Follow-up    HPI:  She is a 70 year old woman who has been hospitalized from 8 ff-21-22 through 09-28-20. Her medical history includes diabetes; hypertension; constipation hyperlipidemia. She presented to the ED with a complaint of bilateral lower extremity weakness. The symptom started about one month ago. The morning she presented to the ED when she could not get out of bed this AM. She normally ambulates with rollator. Her legs felt very weak.  She has been incontinent of urine; not bowel; no saddle anesthesia.   She was admitted with generalized weakness mainly on left lower extremity. Her MRI did show severe canal stenosis at multiple levels; with no abnormalities that might require surgery. She is on a tapering dose of prednisone. She was to see Dr. Sherley Bounds at [facility] neurosurgery; however; she has been discharged from that practice and will not be to be seen by them.  She was treated for e-coli UTI; will need to complete keflex. If her symptoms do not improve after her treatment; the urinary incontinence may be from her spinal stenosis  Her goal is to return back home. She will continue to be followed for her chronic illnesses including:  Hypertension associated with type 2 diabetes mellitus:  Hyperlipidemia associated with type 2 diabetes mellitus:   . Diabetes type 2 in obese: Hypokalemia:  Chronic constipation:   Past Medical History:  Diagnosis Date   Cancer ([place])    Diabetes mellitus without complication ([place])    Hypertension     Past Surgical History:  Procedure Laterality Date   BACK SURGERY     BREAST LUMPECTOMY     CHOLECYSTECTOMY      Social History   Socioeconomic  History   Marital status: Married    Spouse name: Not on file   Number of children: Not on file   Years of education: Not on file   Highest education level: Not on file  Occupational History   Not on file  Tobacco Use   Smoking status: Former    Types: Cigarettes   Smokeless tobacco: Never  Vaping Use   Vaping Use: Never used  Substance and Sexual Activity   Alcohol use: Not Currently   Drug use: Never   Sexual activity: Not on file  Other Topics Concern   Not on file  Social History Narrative   Not on file   Social Determinants of Health   Financial Resource Strain: Not on file  Food Insecurity: Not on file  Transportation Needs: Not on file  Physical Activity: Not on file  Stress: Not on file  Social Connections: Not on file  Intimate Partner Violence: Not on file   History reviewed. No pertinent family history.    VITAL SIGNS BP (!) 180/72   Pulse 80   Temp (!) 97.2 F (36.2 C)   Resp 20   Ht '5\' 4"'$  (1.626 m)   SpO2 96%   BMI 30.65 kg/m   Outpatient Encounter Medications as of 09/29/2020  Medication Sig   acetaminophen (TYLENOL) 325 MG tablet Take 2 tablets (650 mg total) by mouth every 6 (six) hours as needed for mild pain (or Fever >/= 101).  cephALEXin (KEFLEX) 500 MG capsule Take 500 mg by mouth every 12 (twelve) hours.   DULoxetine (CYMBALTA) 60 MG capsule Take 60 mg by mouth daily.   glipiZIDE (GLUCOTROL) 5 MG tablet Take 1 tablet (5 mg total) by mouth 2 (two) times daily before a meal.   insulin detemir (LEVEMIR) 100 UNIT/ML injection Inject 0.1 mLs (10 Units total) into the skin daily.   lisinopril-hydrochlorothiazide (ZESTORETIC) 20-12.5 MG tablet Take 1 tablet by mouth daily.   metFORMIN (GLUCOPHAGE) 500 MG tablet Take 1,000 mg by mouth in the morning and at bedtime.   methocarbamol (ROBAXIN) 500 MG tablet Take 1 tablet (500 mg total) by mouth 4 (four) times daily.   metoprolol tartrate (LOPRESSOR) 50 MG tablet Take 50 mg by mouth daily.   NON  FORMULARY Diet: NAS, Regular Liquids   OVER THE COUNTER MEDICATION Take 1 tablet by mouth in the morning and at bedtime. Homeopathic pain medication   oxyCODONE-acetaminophen (PERCOCET/ROXICET) 5-325 MG tablet Take 1 tablet by mouth every 8 (eight) hours as needed for moderate pain or severe pain.   polyethylene glycol (MIRALAX / GLYCOLAX) 17 g packet Take 17 g by mouth daily.   potassium chloride (KLOR-CON) 10 MEQ tablet Take 1 tablet (10 mEq total) by mouth daily. Take While taking HCTZ   predniSONE (DELTASONE) 20 MG tablet Take 40 mg by mouth once.   rosuvastatin (CRESTOR) 5 MG tablet Take 5 mg by mouth daily.   senna-docusate (SENOKOT-S) 8.6-50 MG tablet Take 2 tablets by mouth at bedtime.   insulin aspart (NOVOLOG) 100 UNIT/ML FlexPen Inject 0-10 Units into the skin 3 (three) times daily with meals. insulin aspart (novoLOG) injection 0-10 Units 0-10 Units Subcutaneous, 3 times daily with meals CBG < 70: Implement Hypoglycemia Standing Orders and refer to Hypoglycemia Standing Orders sidebar report  CBG 70 - 120: 0 unit CBG 121 - 150: 0 unit  CBG 151 - 200: 1 unit CBG 201 - 250: 2 units CBG 251 - 300: 4 units CBG 301 - 350: 6 units  CBG 351 - 400: 8 units  CBG > 400: 10 units   [DISCONTINUED] cephALEXin (KEFLEX) 500 MG capsule Take 1 capsule (500 mg total) by mouth 2 (two) times daily.   [DISCONTINUED] predniSONE (DELTASONE) 20 MG tablet Take 2 tablets (40 mg total) by mouth See admin instructions for 5 days. Take Prednisone 2 tab ( 40 mg) daily with Breakfast for 5 days, then 1 tab ('20mg'$  ) daily for 5 days then STOP   No facility-administered encounter medications on file as of 09/29/2020.     SIGNIFICANT DIAGNOSTIC EXAMS  TODAY  08-25-20: ct of pelvis: No acute displaced fractures identified. Degenerative changes in the Hips  09-24-20: ct of lumbar:  1. No fracture or acute finding. No abnormal enhancement or fluid collection or inflammatory change to suggest infection. No abnormalities  the disc spaces to suggest discitis/osteomyelitis. 2. Status post posterior fusion, L4 through S1. Orthopedic hardware is well-seated. There is mature bone graft material along the posterior elements. 3. Degenerative changes throughout the visualized spine as detailed. No convincing disc herniation.  09-25-20: MRI of lumbar spine:  1. No acute findings. Specifically, no evidence of discitis-osteomyelitis, as clinically questioned. 2. Severe canal stenosis at L2-L3 and moderate-to-severe canal stenosis at L3-L4. 3. Moderate canal stenosis at L1-L2. 4. Foraminal stenosis is moderate bilaterally at L2-L3 and moderate-to-severe on the left at L3-4. 5. Status post L4-S1 PLIF with solid bony fusion. No residual foraminal stenosis at these levels.  09-25-20:  2-d echo:    1. Left ventricular ejection fraction, by estimation, is 60 to 65%. The  left ventricle has normal function. The left ventricle has no regional  wall motion abnormalities. Left ventricular diastolic parameters are  consistent with Grade I diastolic  dysfunction (impaired relaxation).   09-28-20: bilateral lower extremity venous dopplers: No lower extremity DVT   LABS REVIEWED;   09-24-20: wbc 6.1; hgb 13.4; hct 41.3; mcv 83.6 plt 195; glucose 174; bun 15; creat 0.82; k+ 3.9; an++ 136; ca 8.9; GFR>60; liver normal albumin 3.6; urine culture: e-coli: blood culture: no growth 09-25-20: hgb a1c 6.1; tsh 0.112 free t4: 1.44 09-26-20: vit B 12: 511 09-28-20: glucose 120; bun 23; creat 0.75;k + 3.4;na++ 138; ca 9.3; GFR>60; liver normal albumin 3.1   Review of Systems  Constitutional:  Negative for malaise/fatigue.  Respiratory:  Negative for cough and shortness of breath.   Cardiovascular:  Negative for chest pain, palpitations and leg swelling.  Gastrointestinal:  Negative for abdominal pain, constipation and heartburn.  Musculoskeletal:  Negative for back pain, joint pain and myalgias.  Skin: Negative.   Neurological:  Positive for  tingling. Negative for dizziness.       She has intermittent tingling and numbness in lower extremities Has sciatic pain left lower extremity  Left lower extremity is weak  Psychiatric/Behavioral:  The patient is not nervous/anxious.      Physical Exam Constitutional:      General: She is not in acute distress.    Appearance: She is well-developed. She is obese. She is not diaphoretic.  Neck:     Thyroid: No thyromegaly.  Cardiovascular:     Rate and Rhythm: Normal rate and regular rhythm.     Pulses: Normal pulses.     Heart sounds: Murmur heard.     Comments: 2/6 Pulmonary:     Effort: Pulmonary effort is normal. No respiratory distress.     Breath sounds: Normal breath sounds.  Abdominal:     General: Bowel sounds are normal. There is no distension.     Palpations: Abdomen is soft.     Tenderness: There is no abdominal tenderness.  Musculoskeletal:        General: Normal range of motion.     Cervical back: Neck supple.     Right lower leg: No edema.     Left lower leg: No edema.  Lymphadenopathy:     Cervical: No cervical adenopathy.  Skin:    General: Skin is warm and dry.  Neurological:     Mental Status: She is alert and oriented to person, place, and time.  Psychiatric:        Mood and Affect: Mood normal.      ASSESSMENT/ PLAN:  TODAY  Acute lower UTI due to e-coli: is stable will complete keflex and will monitor her status.   2. Spinal stenosis of lumbosacral region/severe canal stenosis at L2-L3 and moderate to severe stenosis L3-L4: / generalized weakness: is without change will complete prednisone taper; robaxin 500 mg four times daily; cymbalta 60 mg twice daily; percocet 5/325 mg every 8 hours as needed through 10-03-20  3. Hypertension associated with type 2 diabetes mellitus: repeated blood pressure: 138/79: will continue lisinopril/hctz 20/215 mg daily; lopressor 50 mg daily   4. Hyperlipidemia associated with type 2 diabetes mellitus: is stable  will continue crestor 5 mg daily   5. Diabetes type 2 in obese: is stable hgb a1c 6.1 will continue levemir 10 units daily; novolog 5 units  with meals for cbg>150  metformin 1 gm twice daily will stop glipizide due to increased risk of hypoglycemia with a hgb a1c of 6.1.   6. Hypokalemia: is stable k+ is 3.4 will continue k+ 10 meq daily   7. Chronic constipation: is stable will continue miralax daily and senna s 2 tabs nightly   8. Urinary incontinence with continuous leaking: is without change is using Kegel exercises.    9. Hyperthyroidism: is without change tsh 0.112 free t4: 1.44 will monitor and repeat labs in the next 1-2 weeks.     Will check cbc; bmp; sed rate crp.    Ok Edwards NP [facility]  Contact [phone] Monday through Friday 8am- 5pm  After hours call [phone]

## 2020-10-02 ENCOUNTER — Other Ambulatory Visit (HOSPITAL_COMMUNITY)
Admission: RE | Admit: 2020-10-02 | Discharge: 2020-10-02 | Disposition: A | Discharge status: Home / Self Care | Payer: Medicare HMO | Source: Skilled Nursing Facility | Attending: Adult Health | Admitting: Adult Health

## 2020-10-02 DIAGNOSIS — E1159 Type 2 diabetes mellitus with other circulatory complications: Secondary | ICD-10-CM | POA: Insufficient documentation

## 2020-10-02 DIAGNOSIS — E1169 Type 2 diabetes mellitus with other specified complication: Secondary | ICD-10-CM | POA: Insufficient documentation

## 2020-10-02 DIAGNOSIS — K5909 Other constipation: Secondary | ICD-10-CM | POA: Insufficient documentation

## 2020-10-02 DIAGNOSIS — N3945 Continuous leakage: Secondary | ICD-10-CM | POA: Insufficient documentation

## 2020-10-02 DIAGNOSIS — E876 Hypokalemia: Secondary | ICD-10-CM | POA: Insufficient documentation

## 2020-10-02 DIAGNOSIS — I152 Hypertension secondary to endocrine disorders: Secondary | ICD-10-CM | POA: Insufficient documentation

## 2020-10-02 DIAGNOSIS — M48061 Spinal stenosis, lumbar region without neurogenic claudication: Secondary | ICD-10-CM | POA: Insufficient documentation

## 2020-10-02 LAB — BASIC METABOLIC PANEL
Anion gap: 10 (ref 5–15)
BUN: 40 mg/dL — ABNORMAL HIGH (ref 8–23)
CO2: 27 mmol/L (ref 22–32)
Calcium: 9.9 mg/dL (ref 8.9–10.3)
Chloride: 100 mmol/L (ref 98–111)
Creatinine, Ser: 0.89 mg/dL (ref 0.44–1.00)
GFR, Estimated: >60 mL/min (ref >60)
Glucose, Bld: 71 mg/dL (ref 70–99)
Potassium: 3.9 mmol/L (ref 3.5–5.1)
Sodium: 137 mmol/L (ref 135–145)

## 2020-10-02 LAB — CBC
HCT: 45.3 % (ref 36.0–46.0)
Hemoglobin: 14.5 g/dL (ref 12.0–15.0)
MCH: 26.8 pg (ref 26.0–34.0)
MCHC: 32 g/dL (ref 30.0–36.0)
MCV: 83.6 fL (ref 80.0–100.0)
Platelets: 300 10*3/uL (ref 150–400)
RBC: 5.42 MIL/uL — ABNORMAL HIGH (ref 3.87–5.11)
RDW: 13.7 % (ref 11.5–15.5)
WBC: 14 10*3/uL — ABNORMAL HIGH (ref 4.0–10.5)
nRBC: 0 % (ref 0.0–0.2)

## 2020-10-02 LAB — SEDIMENTATION RATE: Sed Rate: 22 mm/hr (ref 0–22)

## 2020-10-02 LAB — C-REACTIVE PROTEIN: CRP: 0.7 mg/dL (ref <1.0)

## 2020-10-04 DIAGNOSIS — I5032 Chronic diastolic (congestive) heart failure: Secondary | ICD-10-CM | POA: Diagnosis not present

## 2020-10-04 DIAGNOSIS — M1710 Unilateral primary osteoarthritis, unspecified knee: Secondary | ICD-10-CM | POA: Diagnosis not present

## 2020-10-04 DIAGNOSIS — I110 Hypertensive heart disease with heart failure: Secondary | ICD-10-CM | POA: Diagnosis not present

## 2020-10-10 ENCOUNTER — Encounter (HOSPITAL_COMMUNITY)
Admission: RE | Admit: 2020-10-10 | Discharge: 2020-10-10 | Disposition: A | Discharge status: Home / Self Care | Payer: Medicare HMO | Source: Skilled Nursing Facility | Attending: Adult Health | Admitting: Adult Health

## 2020-10-10 DIAGNOSIS — E039 Hypothyroidism, unspecified: Secondary | ICD-10-CM | POA: Diagnosis not present

## 2020-10-10 LAB — T4, FREE: Free T4: 1.35 ng/dL — ABNORMAL HIGH (ref 0.61–1.12)

## 2020-10-10 LAB — TSH: TSH: 1.335 u[IU]/mL (ref 0.350–4.500)

## 2020-10-12 ENCOUNTER — Non-Acute Institutional Stay (SKILLED_NURSING_FACILITY): Payer: Medicare HMO | Admitting: Adult Health

## 2020-10-12 ENCOUNTER — Encounter: Payer: Self-pay | Admitting: Adult Health

## 2020-10-12 DIAGNOSIS — E669 Obesity, unspecified: Secondary | ICD-10-CM | POA: Diagnosis not present

## 2020-10-12 DIAGNOSIS — E1169 Type 2 diabetes mellitus with other specified complication: Secondary | ICD-10-CM | POA: Diagnosis not present

## 2020-10-12 DIAGNOSIS — E1159 Type 2 diabetes mellitus with other circulatory complications: Secondary | ICD-10-CM | POA: Diagnosis not present

## 2020-10-12 DIAGNOSIS — N390 Urinary tract infection, site not specified: Secondary | ICD-10-CM | POA: Diagnosis not present

## 2020-10-12 DIAGNOSIS — E785 Hyperlipidemia, unspecified: Secondary | ICD-10-CM

## 2020-10-12 DIAGNOSIS — I152 Hypertension secondary to endocrine disorders: Secondary | ICD-10-CM | POA: Diagnosis not present

## 2020-10-12 NOTE — Progress Notes (Signed)
Location:  [place] Room Number: 131-P Place of Service:  SNF (31)   CODE STATUS: DNR  Allergies  Allergen Reactions   Codeine Nausea And Vomiting    Patient stated she was not allergic to codeine anymore     Chief Complaint  Patient presents with   Acute Visit    Care plan meeting     HPI:  We have come together for her care plan meeting. Family present  BIMS 15/15 mood 6/30: decreased energy anxious. She is nonambulatory she requires extensive assist with adls. She is frequently incontinent of bladder and bowel. She is able to feed herself after setup. Her cbg readings are stable. Dietary: weight is 175 pounds down 4-5 pounds food appetite is good; diet NAS.   Therapy  bath and dress self upper body; min assist with lower body; toileting with minimal assist; transfers with min assist; ambulate 30 feet with standby assistance; due for car transfer; stairs: 2 rails contact assist. Husband has concerns about taking her home. He feels that at least 3 more days of therapy will be of great help. She has poor confidence in her abilities.    She continues to be followed for her chronic illnesses including: Hypertension associated with type 2 diabetes mellitus Diabetes type 2 in obese   Acute lower UTI in e-coli   Hyperlipidemia associated with type 2 diabetes mellitus  Past Medical History:  Diagnosis Date   Cancer ([place])    Diabetes mellitus without complication ([place])    Hypertension     Past Surgical History:  Procedure Laterality Date   BACK SURGERY     BREAST LUMPECTOMY     CHOLECYSTECTOMY      Social History   Socioeconomic History   Marital status: Married    Spouse name: Not on file   Number of children: Not on file   Years of education: Not on file   Highest education level: Not on file  Occupational History   Not on file  Tobacco Use   Smoking status: Former    Types: Cigarettes   Smokeless tobacco: Never  Vaping Use   Vaping Use: Never  used  Substance and Sexual Activity   Alcohol use: Not Currently   Drug use: Never   Sexual activity: Not on file  Other Topics Concern   Not on file  Social History Narrative   Not on file   Social Determinants of Health   Financial Resource Strain: Not on file  Food Insecurity: Not on file  Transportation Needs: Not on file  Physical Activity: Not on file  Stress: Not on file  Social Connections: Not on file  Intimate Partner Violence: Not on file   History reviewed. No pertinent family history.    VITAL SIGNS BP 116/62   Pulse 70   Temp 98.1 F (36.7 C)   Resp 20   Ht '5\' 4"'$  (1.626 m)   Wt 175 lb 6.4 oz (79.6 kg)   SpO2 96%   BMI 30.11 kg/m   Outpatient Encounter Medications as of 10/12/2020  Medication Sig   acetaminophen (TYLENOL) 325 MG tablet Take 2 tablets (650 mg total) by mouth every 6 (six) hours as needed for mild pain (or Fever >/= 101).   DULoxetine (CYMBALTA) 60 MG capsule Take 60 mg by mouth daily.   insulin aspart (NOVOLOG) 100 UNIT/ML FlexPen Inject 5 Units into the skin 3 (three) times daily with meals. Special Instructions: Give 5 units if cbg >  150. DO NOT GIVE MORE THAN 10 MINUTES BEFORE A MEAL   insulin detemir (LEVEMIR) 100 UNIT/ML injection Inject 0.1 mLs (10 Units total) into the skin daily.   lisinopril-hydrochlorothiazide (ZESTORETIC) 20-12.5 MG tablet Take 1 tablet by mouth daily.   metFORMIN (GLUCOPHAGE) 500 MG tablet Take 1,000 mg by mouth in the morning and at bedtime.   methocarbamol (ROBAXIN) 500 MG tablet Take 1 tablet (500 mg total) by mouth 4 (four) times daily.   metoprolol tartrate (LOPRESSOR) 50 MG tablet Take 50 mg by mouth daily.   NON FORMULARY Diet: Regular and NAS   polyethylene glycol (MIRALAX / GLYCOLAX) 17 g packet Take 17 g by mouth daily.   potassium chloride (KLOR-CON) 10 MEQ tablet Take 1 tablet (10 mEq total) by mouth daily. Take While taking HCTZ   rosuvastatin (CRESTOR) 5 MG tablet Take 5 mg by mouth daily.    senna-docusate (SENOKOT-S) 8.6-50 MG tablet Take 2 tablets by mouth at bedtime.   No facility-administered encounter medications on file as of 10/12/2020.     SIGNIFICANT DIAGNOSTIC EXAMS   PREVIOUS   08-25-20: ct of pelvis: No acute displaced fractures identified. Degenerative changes in the Hips  09-24-20: ct of lumbar:  1. No fracture or acute finding. No abnormal enhancement or fluid collection or inflammatory change to suggest infection. No abnormalities the disc spaces to suggest discitis/osteomyelitis. 2. Status post posterior fusion, L4 through S1. Orthopedic hardware is well-seated. There is mature bone graft material along the posterior elements. 3. Degenerative changes throughout the visualized spine as detailed. No convincing disc herniation.  09-25-20: MRI of lumbar spine:  1. No acute findings. Specifically, no evidence of discitis-osteomyelitis, as clinically questioned. 2. Severe canal stenosis at L2-L3 and moderate-to-severe canal stenosis at L3-L4. 3. Moderate canal stenosis at L1-L2. 4. Foraminal stenosis is moderate bilaterally at L2-L3 and moderate-to-severe on the left at L3-4. 5. Status post L4-S1 PLIF with solid bony fusion. No residual foraminal stenosis at these levels.  09-25-20: 2-d echo:   1. Left ventricular ejection fraction, by estimation, is 60 to 65%. The  left ventricle has normal function. The left ventricle has no regional  wall motion abnormalities. Left ventricular diastolic parameters are  consistent with Grade I diastolic  dysfunction (impaired relaxation).   09-28-20: bilateral lower extremity venous dopplers: No lower extremity DVT   NO NEW EXAMS.   LABS REVIEWED; PREVIOUS   09-24-20: wbc 6.1; hgb 13.4; hct 41.3; mcv 83.6 plt 195; glucose 174; bun 15; creat 0.82; k+ 3.9; an++ 136; ca 8.9; GFR>60; liver normal albumin 3.6; urine culture: e-coli: blood culture: no growth 09-25-20: hgb a1c 6.1; tsh 0.112 free t4: 1.44 09-26-20: vit B 12:  511 09-28-20: glucose 120; bun 23; creat 0.75;k + 3.4;na++ 138; ca 9.3; GFR>60; liver normal albumin 3.1  NO NEW LABS.    Review of Systems  Constitutional:  Negative for malaise/fatigue.  Respiratory:  Negative for cough and shortness of breath.   Cardiovascular:  Negative for chest pain, palpitations and leg swelling.  Gastrointestinal:  Negative for abdominal pain, constipation and heartburn.  Musculoskeletal:  Negative for back pain, joint pain and myalgias.  Skin: Negative.   Neurological:  Negative for dizziness.  Psychiatric/Behavioral:  The patient is not nervous/anxious.    Physical Exam Constitutional:      General: She is not in acute distress.    Appearance: She is well-developed. She is not diaphoretic.  Neck:     Thyroid: No thyromegaly.  Cardiovascular:     Rate  and Rhythm: Normal rate and regular rhythm.     Pulses: Normal pulses.     Heart sounds: Murmur heard.     Comments: 2/6 Pulmonary:     Effort: Pulmonary effort is normal. No respiratory distress.     Breath sounds: Normal breath sounds.  Abdominal:     General: Bowel sounds are normal. There is no distension.     Palpations: Abdomen is soft.     Tenderness: There is no abdominal tenderness.  Musculoskeletal:        General: Normal range of motion.     Cervical back: Neck supple.     Right lower leg: No edema.     Left lower leg: No edema.  Lymphadenopathy:     Cervical: No cervical adenopathy.  Skin:    General: Skin is warm and dry.  Neurological:     Mental Status: She is alert and oriented to person, place, and time.     Comments: Has periodic tremors   Psychiatric:        Mood and Affect: Mood normal.       ASSESSMENT/ PLAN:  TODAY  Hypertension associated with type 2 diabetes mellitus Diabetes type 2 in obese Acute lower UTI in e-coli Hyperlipidemia associated with type 2 diabetes mellitus    Will continue current medications Will continue current plan of care Will continue  to monitor her status. Goals of care: to return home with spouse when able. Will be discharging in the next week.    Time spent with patient 40 minutes goals of care; medications; therapy    Ok Edwards NP [facility]  Contact [phone] Monday through Friday 8am- 5pm  After hours call [phone]

## 2020-10-16 ENCOUNTER — Non-Acute Institutional Stay (SKILLED_NURSING_FACILITY): Payer: Medicare HMO | Admitting: Adult Health

## 2020-10-16 ENCOUNTER — Other Ambulatory Visit: Payer: Self-pay | Admitting: Adult Health

## 2020-10-16 ENCOUNTER — Encounter: Payer: Self-pay | Admitting: Adult Health

## 2020-10-16 DIAGNOSIS — I152 Hypertension secondary to endocrine disorders: Secondary | ICD-10-CM

## 2020-10-16 DIAGNOSIS — M6281 Muscle weakness (generalized): Secondary | ICD-10-CM | POA: Diagnosis not present

## 2020-10-16 DIAGNOSIS — E669 Obesity, unspecified: Secondary | ICD-10-CM

## 2020-10-16 DIAGNOSIS — E1169 Type 2 diabetes mellitus with other specified complication: Secondary | ICD-10-CM

## 2020-10-16 DIAGNOSIS — E785 Hyperlipidemia, unspecified: Secondary | ICD-10-CM | POA: Diagnosis not present

## 2020-10-16 DIAGNOSIS — M4807 Spinal stenosis, lumbosacral region: Secondary | ICD-10-CM | POA: Diagnosis not present

## 2020-10-16 DIAGNOSIS — E1159 Type 2 diabetes mellitus with other circulatory complications: Secondary | ICD-10-CM | POA: Diagnosis not present

## 2020-10-16 DIAGNOSIS — R2681 Unsteadiness on feet: Secondary | ICD-10-CM | POA: Diagnosis not present

## 2020-10-16 DIAGNOSIS — M48061 Spinal stenosis, lumbar region without neurogenic claudication: Secondary | ICD-10-CM | POA: Diagnosis not present

## 2020-10-16 DIAGNOSIS — Z741 Need for assistance with personal care: Secondary | ICD-10-CM | POA: Diagnosis not present

## 2020-10-16 DIAGNOSIS — R262 Difficulty in walking, not elsewhere classified: Secondary | ICD-10-CM | POA: Diagnosis not present

## 2020-10-16 DIAGNOSIS — N390 Urinary tract infection, site not specified: Secondary | ICD-10-CM | POA: Diagnosis not present

## 2020-10-16 MED ORDER — ROSUVASTATIN CALCIUM 5 MG PO TABS: 5.0000 mg | ORAL_TABLET | Freq: Every day | ORAL | 0 refills | Status: DC

## 2020-10-16 MED ORDER — DULOXETINE HCL 60 MG PO CPEP: 60.0000 mg | ORAL_CAPSULE | Freq: Every day | ORAL | 0 refills | Status: DC

## 2020-10-16 MED ORDER — INSULIN DETEMIR 100 UNIT/ML ~~LOC~~ SOLN: 10.0000 [IU] | Freq: Every day | SUBCUTANEOUS | 0 refills | Status: DC

## 2020-10-16 MED ORDER — INSULIN ASPART 100 UNIT/ML FLEXPEN: 5.0000 [IU] | PEN_INJECTOR | Freq: Three times a day (TID) | SUBCUTANEOUS | 0 refills | Status: DC

## 2020-10-16 MED ORDER — METHOCARBAMOL 500 MG PO TABS: 500.0000 mg | ORAL_TABLET | Freq: Four times a day (QID) | ORAL | 0 refills | Status: DC

## 2020-10-16 MED ORDER — LISINOPRIL-HYDROCHLOROTHIAZIDE 20-12.5 MG PO TABS: 1.0000 | ORAL_TABLET | Freq: Every day | ORAL | 0 refills | Status: DC

## 2020-10-16 MED ORDER — METFORMIN HCL 500 MG PO TABS: 1000.0000 mg | ORAL_TABLET | Freq: Two times a day (BID) | ORAL | 0 refills | Status: DC

## 2020-10-16 MED ORDER — POTASSIUM CHLORIDE ER 10 MEQ PO TBCR: 10.0000 meq | EXTENDED_RELEASE_TABLET | Freq: Every day | ORAL | 0 refills | Status: DC

## 2020-10-16 MED ORDER — METOPROLOL TARTRATE 50 MG PO TABS: 50.0000 mg | ORAL_TABLET | Freq: Every day | ORAL | 0 refills | Status: DC

## 2020-10-16 NOTE — Progress Notes (Signed)
Location:   [facility] Room Number: 131-P Place of Service:  SNF (31)    CODE STATUS: DNR  Allergies  Allergen Reactions   Codeine Nausea And Vomiting    Patient stated she was not allergic to codeine anymore     Chief Complaint  Patient presents with   Discharge Note    Discharge to home    HPI:  She is being discharged to home with home health for pt/ot. She will need a bsc and wheelchair. She will need her prescriptions written and will need to follow up with her primary care provider. She had been hospitalized for increased weakness due to spinal stenosis. She was admitted to this facility for short term rehab. She has participated in pt/ot to improve upon her level of independence with her adls. She is now ready for discharge to home.    Past Medical History:  Diagnosis Date   Cancer ([place])    Diabetes mellitus without complication ([place])    Hypertension     Past Surgical History:  Procedure Laterality Date   BACK SURGERY     BREAST LUMPECTOMY     CHOLECYSTECTOMY      Social History   Socioeconomic History   Marital status: Married    Spouse name: Not on file   Number of children: Not on file   Years of education: Not on file   Highest education level: Not on file  Occupational History   Not on file  Tobacco Use   Smoking status: Former    Types: Cigarettes   Smokeless tobacco: Never  Vaping Use   Vaping Use: Never used  Substance and Sexual Activity   Alcohol use: Not Currently   Drug use: Never   Sexual activity: Not on file  Other Topics Concern   Not on file  Social History Narrative   Not on file   Social Determinants of Health   Financial Resource Strain: Not on file  Food Insecurity: Not on file  Transportation Needs: Not on file  Physical Activity: Not on file  Stress: Not on file  Social Connections: Not on file  Intimate Partner Violence: Not on file   History reviewed. No pertinent family history.  VITAL  SIGNS BP 121/80   Pulse 80   Temp (!) 97.4 F (36.3 C)   Resp 20   Ht '5\' 4"'$  (1.626 m)   Wt 175 lb 6.4 oz (79.6 kg)   SpO2 96%   BMI 30.11 kg/m   Patient's Medications  New Prescriptions   No medications on file  Previous Medications   ACETAMINOPHEN (TYLENOL) 325 MG TABLET    Take 2 tablets (650 mg total) by mouth every 6 (six) hours as needed for mild pain (or Fever >/= 101).   DULOXETINE (CYMBALTA) 60 MG CAPSULE    Take 60 mg by mouth daily.   INSULIN ASPART (NOVOLOG) 100 UNIT/ML FLEXPEN    Inject 5 Units into the skin 3 (three) times daily with meals. Special Instructions: Give 5 units if cbg > 150. DO NOT GIVE MORE THAN 10 MINUTES BEFORE A MEAL   INSULIN DETEMIR (LEVEMIR) 100 UNIT/ML INJECTION    Inject 0.1 mLs (10 Units total) into the skin daily.   LISINOPRIL-HYDROCHLOROTHIAZIDE (ZESTORETIC) 20-12.5 MG TABLET    Take 1 tablet by mouth daily.   METFORMIN (GLUCOPHAGE) 500 MG TABLET    Take 1,000 mg by mouth in the morning and at bedtime.   METHOCARBAMOL (ROBAXIN) 500 MG TABLET  Take 1 tablet (500 mg total) by mouth 4 (four) times daily.   METOPROLOL TARTRATE (LOPRESSOR) 50 MG TABLET    Take 50 mg by mouth daily.   NON FORMULARY    Diet:NAS and Regular Liquids   POLYETHYLENE GLYCOL (MIRALAX / GLYCOLAX) 17 G PACKET    Take 17 g by mouth daily.   POTASSIUM CHLORIDE (KLOR-CON) 10 MEQ TABLET    Take 1 tablet (10 mEq total) by mouth daily. Take While taking HCTZ   ROSUVASTATIN (CRESTOR) 5 MG TABLET    Take 5 mg by mouth daily.   SENNA-DOCUSATE (SENOKOT-S) 8.6-50 MG TABLET    Take 2 tablets by mouth at bedtime.  Modified Medications   No medications on file  Discontinued Medications   No medications on file     SIGNIFICANT DIAGNOSTIC EXAMS  PREVIOUS   08-25-20: ct of pelvis: No acute displaced fractures identified. Degenerative changes in the Hips  09-24-20: ct of lumbar:  1. No fracture or acute finding. No abnormal enhancement or fluid collection or inflammatory change to  suggest infection. No abnormalities the disc spaces to suggest discitis/osteomyelitis. 2. Status post posterior fusion, L4 through S1. Orthopedic hardware is well-seated. There is mature bone graft material along the posterior elements. 3. Degenerative changes throughout the visualized spine as detailed. No convincing disc herniation.  09-25-20: MRI of lumbar spine:  1. No acute findings. Specifically, no evidence of discitis-osteomyelitis, as clinically questioned. 2. Severe canal stenosis at L2-L3 and moderate-to-severe canal stenosis at L3-L4. 3. Moderate canal stenosis at L1-L2. 4. Foraminal stenosis is moderate bilaterally at L2-L3 and moderate-to-severe on the left at L3-4. 5. Status post L4-S1 PLIF with solid bony fusion. No residual foraminal stenosis at these levels.  09-25-20: 2-d echo:   1. Left ventricular ejection fraction, by estimation, is 60 to 65%. The  left ventricle has normal function. The left ventricle has no regional  wall motion abnormalities. Left ventricular diastolic parameters are  consistent with Grade I diastolic  dysfunction (impaired relaxation).   09-28-20: bilateral lower extremity venous dopplers: No lower extremity DVT   NO NEW EXAMS.   LABS REVIEWED; PREVIOUS   09-24-20: wbc 6.1; hgb 13.4; hct 41.3; mcv 83.6 plt 195; glucose 174; bun 15; creat 0.82; k+ 3.9; an++ 136; ca 8.9; GFR>60; liver normal albumin 3.6; urine culture: e-coli: blood culture: no growth 09-25-20: hgb a1c 6.1; tsh 0.112 free t4: 1.44 09-26-20: vit B 12: 511 09-28-20: glucose 120; bun 23; creat 0.75;k + 3.4;na++ 138; ca 9.3; GFR>60; liver normal albumin 3.1  NO NEW LABS.   Review of Systems  Constitutional:  Negative for malaise/fatigue.  Respiratory:  Negative for cough and shortness of breath.   Cardiovascular:  Negative for chest pain, palpitations and leg swelling.  Gastrointestinal:  Negative for abdominal pain, constipation and heartburn.  Musculoskeletal:  Negative for back pain,  joint pain and myalgias.  Skin: Negative.   Neurological:  Negative for dizziness.  Psychiatric/Behavioral:  The patient is not nervous/anxious.     Physical Exam Constitutional:      General: She is not in acute distress.    Appearance: She is well-developed. She is not diaphoretic.  Neck:     Thyroid: No thyromegaly.  Cardiovascular:     Rate and Rhythm: Normal rate and regular rhythm.     Pulses: Normal pulses.     Heart sounds: Murmur heard.     Comments: 2/6 Pulmonary:     Effort: Pulmonary effort is normal. No respiratory distress.  Breath sounds: Normal breath sounds.  Abdominal:     General: Bowel sounds are normal. There is no distension.     Palpations: Abdomen is soft.     Tenderness: There is no abdominal tenderness.  Musculoskeletal:        General: Normal range of motion.     Cervical back: Neck supple.     Right lower leg: No edema.     Left lower leg: No edema.  Lymphadenopathy:     Cervical: No cervical adenopathy.  Skin:    General: Skin is warm and dry.  Neurological:     Mental Status: She is alert and oriented to person, place, and time.     Comments:  Has periodic tremors   Psychiatric:        Mood and Affect: Mood normal.     ASSESSMENT/ PLAN:   Patient is being discharged with the following home health services:  pt/ot to evaluate and treat as indicated for gait balance strength adl training.   Patient is being discharged with the following durable medical equipment:  bedside commode. Wheelchair with elevated leg rests cushions. To allow her to maintain her current level of independence with her adls which cannot be achieved with a cane crutches walker. She is able to self propel wheelchair.   Patient has been advised to f/u with their PCP in 1-2 weeks to bring them up to date on their rehab stay.  Social services at facility was responsible for arranging this appointment.  Pt was provided with a 30 day supply of prescriptions for medications  and refills must be obtained from their PCP.  For controlled substances, a more limited supply may be provided adequate until PCP appointment only.  A 30 day supply of her prescription medications have been sent to [facility] drug  Time spent with patient: medications; home health dme.    Ok Edwards NP [facility]  Contact [phone] Monday through Friday 8am- 5pm  After hours call [phone]

## 2020-10-19 DIAGNOSIS — I152 Hypertension secondary to endocrine disorders: Secondary | ICD-10-CM | POA: Diagnosis not present

## 2020-10-19 DIAGNOSIS — Z981 Arthrodesis status: Secondary | ICD-10-CM | POA: Diagnosis not present

## 2020-10-19 DIAGNOSIS — M48061 Spinal stenosis, lumbar region without neurogenic claudication: Secondary | ICD-10-CM | POA: Diagnosis not present

## 2020-10-19 DIAGNOSIS — I5189 Other ill-defined heart diseases: Secondary | ICD-10-CM | POA: Diagnosis not present

## 2020-10-19 DIAGNOSIS — N390 Urinary tract infection, site not specified: Secondary | ICD-10-CM | POA: Diagnosis not present

## 2020-10-19 DIAGNOSIS — B9620 Unspecified Escherichia coli [E. coli] as the cause of diseases classified elsewhere: Secondary | ICD-10-CM | POA: Diagnosis not present

## 2020-10-19 DIAGNOSIS — E876 Hypokalemia: Secondary | ICD-10-CM | POA: Diagnosis not present

## 2020-10-19 DIAGNOSIS — R32 Unspecified urinary incontinence: Secondary | ICD-10-CM | POA: Diagnosis not present

## 2020-10-19 DIAGNOSIS — E1159 Type 2 diabetes mellitus with other circulatory complications: Secondary | ICD-10-CM | POA: Diagnosis not present

## 2020-10-19 DIAGNOSIS — E1169 Type 2 diabetes mellitus with other specified complication: Secondary | ICD-10-CM | POA: Diagnosis not present

## 2020-10-23 DIAGNOSIS — Z981 Arthrodesis status: Secondary | ICD-10-CM | POA: Diagnosis not present

## 2020-10-23 DIAGNOSIS — I152 Hypertension secondary to endocrine disorders: Secondary | ICD-10-CM | POA: Diagnosis not present

## 2020-10-23 DIAGNOSIS — E1169 Type 2 diabetes mellitus with other specified complication: Secondary | ICD-10-CM | POA: Diagnosis not present

## 2020-10-23 DIAGNOSIS — N390 Urinary tract infection, site not specified: Secondary | ICD-10-CM | POA: Diagnosis not present

## 2020-10-23 DIAGNOSIS — E1159 Type 2 diabetes mellitus with other circulatory complications: Secondary | ICD-10-CM | POA: Diagnosis not present

## 2020-10-23 DIAGNOSIS — R32 Unspecified urinary incontinence: Secondary | ICD-10-CM | POA: Diagnosis not present

## 2020-10-23 DIAGNOSIS — I5189 Other ill-defined heart diseases: Secondary | ICD-10-CM | POA: Diagnosis not present

## 2020-10-23 DIAGNOSIS — B9620 Unspecified Escherichia coli [E. coli] as the cause of diseases classified elsewhere: Secondary | ICD-10-CM | POA: Diagnosis not present

## 2020-10-23 DIAGNOSIS — E876 Hypokalemia: Secondary | ICD-10-CM | POA: Diagnosis not present

## 2020-10-23 DIAGNOSIS — M48061 Spinal stenosis, lumbar region without neurogenic claudication: Secondary | ICD-10-CM | POA: Diagnosis not present

## 2020-10-24 DIAGNOSIS — R32 Unspecified urinary incontinence: Secondary | ICD-10-CM | POA: Diagnosis not present

## 2020-10-24 DIAGNOSIS — Z981 Arthrodesis status: Secondary | ICD-10-CM | POA: Diagnosis not present

## 2020-10-24 DIAGNOSIS — B9620 Unspecified Escherichia coli [E. coli] as the cause of diseases classified elsewhere: Secondary | ICD-10-CM | POA: Diagnosis not present

## 2020-10-24 DIAGNOSIS — E1159 Type 2 diabetes mellitus with other circulatory complications: Secondary | ICD-10-CM | POA: Diagnosis not present

## 2020-10-24 DIAGNOSIS — M48061 Spinal stenosis, lumbar region without neurogenic claudication: Secondary | ICD-10-CM | POA: Diagnosis not present

## 2020-10-24 DIAGNOSIS — E876 Hypokalemia: Secondary | ICD-10-CM | POA: Diagnosis not present

## 2020-10-24 DIAGNOSIS — I5189 Other ill-defined heart diseases: Secondary | ICD-10-CM | POA: Diagnosis not present

## 2020-10-24 DIAGNOSIS — E1169 Type 2 diabetes mellitus with other specified complication: Secondary | ICD-10-CM | POA: Diagnosis not present

## 2020-10-24 DIAGNOSIS — I152 Hypertension secondary to endocrine disorders: Secondary | ICD-10-CM | POA: Diagnosis not present

## 2020-10-24 DIAGNOSIS — N390 Urinary tract infection, site not specified: Secondary | ICD-10-CM | POA: Diagnosis not present

## 2020-10-27 DIAGNOSIS — N390 Urinary tract infection, site not specified: Secondary | ICD-10-CM | POA: Diagnosis not present

## 2020-10-27 DIAGNOSIS — E1169 Type 2 diabetes mellitus with other specified complication: Secondary | ICD-10-CM | POA: Diagnosis not present

## 2020-10-27 DIAGNOSIS — I152 Hypertension secondary to endocrine disorders: Secondary | ICD-10-CM | POA: Diagnosis not present

## 2020-10-27 DIAGNOSIS — M48061 Spinal stenosis, lumbar region without neurogenic claudication: Secondary | ICD-10-CM | POA: Diagnosis not present

## 2020-10-27 DIAGNOSIS — E1159 Type 2 diabetes mellitus with other circulatory complications: Secondary | ICD-10-CM | POA: Diagnosis not present

## 2020-10-27 DIAGNOSIS — I5189 Other ill-defined heart diseases: Secondary | ICD-10-CM | POA: Diagnosis not present

## 2020-10-27 DIAGNOSIS — B9620 Unspecified Escherichia coli [E. coli] as the cause of diseases classified elsewhere: Secondary | ICD-10-CM | POA: Diagnosis not present

## 2020-10-27 DIAGNOSIS — E876 Hypokalemia: Secondary | ICD-10-CM | POA: Diagnosis not present

## 2020-10-27 DIAGNOSIS — R32 Unspecified urinary incontinence: Secondary | ICD-10-CM | POA: Diagnosis not present

## 2020-10-27 DIAGNOSIS — Z981 Arthrodesis status: Secondary | ICD-10-CM | POA: Diagnosis not present

## 2020-10-30 DIAGNOSIS — B9620 Unspecified Escherichia coli [E. coli] as the cause of diseases classified elsewhere: Secondary | ICD-10-CM | POA: Diagnosis not present

## 2020-10-30 DIAGNOSIS — R32 Unspecified urinary incontinence: Secondary | ICD-10-CM | POA: Diagnosis not present

## 2020-10-30 DIAGNOSIS — I5189 Other ill-defined heart diseases: Secondary | ICD-10-CM | POA: Diagnosis not present

## 2020-10-30 DIAGNOSIS — E1159 Type 2 diabetes mellitus with other circulatory complications: Secondary | ICD-10-CM | POA: Diagnosis not present

## 2020-10-30 DIAGNOSIS — E876 Hypokalemia: Secondary | ICD-10-CM | POA: Diagnosis not present

## 2020-10-30 DIAGNOSIS — N390 Urinary tract infection, site not specified: Secondary | ICD-10-CM | POA: Diagnosis not present

## 2020-10-30 DIAGNOSIS — E1169 Type 2 diabetes mellitus with other specified complication: Secondary | ICD-10-CM | POA: Diagnosis not present

## 2020-10-30 DIAGNOSIS — Z981 Arthrodesis status: Secondary | ICD-10-CM | POA: Diagnosis not present

## 2020-10-30 DIAGNOSIS — I152 Hypertension secondary to endocrine disorders: Secondary | ICD-10-CM | POA: Diagnosis not present

## 2020-10-30 DIAGNOSIS — M48061 Spinal stenosis, lumbar region without neurogenic claudication: Secondary | ICD-10-CM | POA: Diagnosis not present

## 2020-10-31 DIAGNOSIS — I5189 Other ill-defined heart diseases: Secondary | ICD-10-CM | POA: Diagnosis not present

## 2020-10-31 DIAGNOSIS — Z981 Arthrodesis status: Secondary | ICD-10-CM | POA: Diagnosis not present

## 2020-10-31 DIAGNOSIS — E1169 Type 2 diabetes mellitus with other specified complication: Secondary | ICD-10-CM | POA: Diagnosis not present

## 2020-10-31 DIAGNOSIS — N390 Urinary tract infection, site not specified: Secondary | ICD-10-CM | POA: Diagnosis not present

## 2020-10-31 DIAGNOSIS — M48061 Spinal stenosis, lumbar region without neurogenic claudication: Secondary | ICD-10-CM | POA: Diagnosis not present

## 2020-10-31 DIAGNOSIS — I152 Hypertension secondary to endocrine disorders: Secondary | ICD-10-CM | POA: Diagnosis not present

## 2020-10-31 DIAGNOSIS — E1159 Type 2 diabetes mellitus with other circulatory complications: Secondary | ICD-10-CM | POA: Diagnosis not present

## 2020-10-31 DIAGNOSIS — B9620 Unspecified Escherichia coli [E. coli] as the cause of diseases classified elsewhere: Secondary | ICD-10-CM | POA: Diagnosis not present

## 2020-10-31 DIAGNOSIS — E876 Hypokalemia: Secondary | ICD-10-CM | POA: Diagnosis not present

## 2020-10-31 DIAGNOSIS — R32 Unspecified urinary incontinence: Secondary | ICD-10-CM | POA: Diagnosis not present

## 2020-11-02 DIAGNOSIS — E876 Hypokalemia: Secondary | ICD-10-CM | POA: Diagnosis not present

## 2020-11-02 DIAGNOSIS — Z981 Arthrodesis status: Secondary | ICD-10-CM | POA: Diagnosis not present

## 2020-11-02 DIAGNOSIS — M48061 Spinal stenosis, lumbar region without neurogenic claudication: Secondary | ICD-10-CM | POA: Diagnosis not present

## 2020-11-02 DIAGNOSIS — B9620 Unspecified Escherichia coli [E. coli] as the cause of diseases classified elsewhere: Secondary | ICD-10-CM | POA: Diagnosis not present

## 2020-11-02 DIAGNOSIS — E1159 Type 2 diabetes mellitus with other circulatory complications: Secondary | ICD-10-CM | POA: Diagnosis not present

## 2020-11-02 DIAGNOSIS — I152 Hypertension secondary to endocrine disorders: Secondary | ICD-10-CM | POA: Diagnosis not present

## 2020-11-02 DIAGNOSIS — R32 Unspecified urinary incontinence: Secondary | ICD-10-CM | POA: Diagnosis not present

## 2020-11-02 DIAGNOSIS — I5189 Other ill-defined heart diseases: Secondary | ICD-10-CM | POA: Diagnosis not present

## 2020-11-02 DIAGNOSIS — N390 Urinary tract infection, site not specified: Secondary | ICD-10-CM | POA: Diagnosis not present

## 2020-11-02 DIAGNOSIS — E1169 Type 2 diabetes mellitus with other specified complication: Secondary | ICD-10-CM | POA: Diagnosis not present

## 2020-11-03 DIAGNOSIS — R32 Unspecified urinary incontinence: Secondary | ICD-10-CM | POA: Diagnosis not present

## 2020-11-03 DIAGNOSIS — I10 Essential (primary) hypertension: Secondary | ICD-10-CM | POA: Diagnosis not present

## 2020-11-03 DIAGNOSIS — E1169 Type 2 diabetes mellitus with other specified complication: Secondary | ICD-10-CM | POA: Diagnosis not present

## 2020-11-03 DIAGNOSIS — B9620 Unspecified Escherichia coli [E. coli] as the cause of diseases classified elsewhere: Secondary | ICD-10-CM | POA: Diagnosis not present

## 2020-11-03 DIAGNOSIS — I152 Hypertension secondary to endocrine disorders: Secondary | ICD-10-CM | POA: Diagnosis not present

## 2020-11-03 DIAGNOSIS — E669 Obesity, unspecified: Secondary | ICD-10-CM | POA: Diagnosis not present

## 2020-11-03 DIAGNOSIS — M48061 Spinal stenosis, lumbar region without neurogenic claudication: Secondary | ICD-10-CM | POA: Diagnosis not present

## 2020-11-03 DIAGNOSIS — N390 Urinary tract infection, site not specified: Secondary | ICD-10-CM | POA: Diagnosis not present

## 2020-11-03 DIAGNOSIS — I5189 Other ill-defined heart diseases: Secondary | ICD-10-CM | POA: Diagnosis not present

## 2020-11-03 DIAGNOSIS — E876 Hypokalemia: Secondary | ICD-10-CM | POA: Diagnosis not present

## 2020-11-03 DIAGNOSIS — E1159 Type 2 diabetes mellitus with other circulatory complications: Secondary | ICD-10-CM | POA: Diagnosis not present

## 2020-11-03 DIAGNOSIS — Z981 Arthrodesis status: Secondary | ICD-10-CM | POA: Diagnosis not present

## 2020-11-06 DIAGNOSIS — N390 Urinary tract infection, site not specified: Secondary | ICD-10-CM | POA: Diagnosis not present

## 2020-11-06 DIAGNOSIS — E1169 Type 2 diabetes mellitus with other specified complication: Secondary | ICD-10-CM | POA: Diagnosis not present

## 2020-11-06 DIAGNOSIS — B9620 Unspecified Escherichia coli [E. coli] as the cause of diseases classified elsewhere: Secondary | ICD-10-CM | POA: Diagnosis not present

## 2020-11-06 DIAGNOSIS — M48061 Spinal stenosis, lumbar region without neurogenic claudication: Secondary | ICD-10-CM | POA: Diagnosis not present

## 2020-11-06 DIAGNOSIS — E1159 Type 2 diabetes mellitus with other circulatory complications: Secondary | ICD-10-CM | POA: Diagnosis not present

## 2020-11-06 DIAGNOSIS — R32 Unspecified urinary incontinence: Secondary | ICD-10-CM | POA: Diagnosis not present

## 2020-11-06 DIAGNOSIS — E876 Hypokalemia: Secondary | ICD-10-CM | POA: Diagnosis not present

## 2020-11-06 DIAGNOSIS — I5189 Other ill-defined heart diseases: Secondary | ICD-10-CM | POA: Diagnosis not present

## 2020-11-06 DIAGNOSIS — Z981 Arthrodesis status: Secondary | ICD-10-CM | POA: Diagnosis not present

## 2020-11-06 DIAGNOSIS — I152 Hypertension secondary to endocrine disorders: Secondary | ICD-10-CM | POA: Diagnosis not present

## 2020-11-10 DIAGNOSIS — M48061 Spinal stenosis, lumbar region without neurogenic claudication: Secondary | ICD-10-CM | POA: Diagnosis not present

## 2020-11-10 DIAGNOSIS — Z981 Arthrodesis status: Secondary | ICD-10-CM | POA: Diagnosis not present

## 2020-11-10 DIAGNOSIS — N390 Urinary tract infection, site not specified: Secondary | ICD-10-CM | POA: Diagnosis not present

## 2020-11-10 DIAGNOSIS — B9620 Unspecified Escherichia coli [E. coli] as the cause of diseases classified elsewhere: Secondary | ICD-10-CM | POA: Diagnosis not present

## 2020-11-14 DIAGNOSIS — E1169 Type 2 diabetes mellitus with other specified complication: Secondary | ICD-10-CM | POA: Diagnosis not present

## 2020-11-14 DIAGNOSIS — M48061 Spinal stenosis, lumbar region without neurogenic claudication: Secondary | ICD-10-CM | POA: Diagnosis not present

## 2020-11-14 DIAGNOSIS — Z981 Arthrodesis status: Secondary | ICD-10-CM | POA: Diagnosis not present

## 2020-11-14 DIAGNOSIS — R32 Unspecified urinary incontinence: Secondary | ICD-10-CM | POA: Diagnosis not present

## 2020-11-14 DIAGNOSIS — B9620 Unspecified Escherichia coli [E. coli] as the cause of diseases classified elsewhere: Secondary | ICD-10-CM | POA: Diagnosis not present

## 2020-11-14 DIAGNOSIS — E876 Hypokalemia: Secondary | ICD-10-CM | POA: Diagnosis not present

## 2020-11-14 DIAGNOSIS — E1159 Type 2 diabetes mellitus with other circulatory complications: Secondary | ICD-10-CM | POA: Diagnosis not present

## 2020-11-14 DIAGNOSIS — N390 Urinary tract infection, site not specified: Secondary | ICD-10-CM | POA: Diagnosis not present

## 2020-11-14 DIAGNOSIS — I152 Hypertension secondary to endocrine disorders: Secondary | ICD-10-CM | POA: Diagnosis not present

## 2020-11-14 DIAGNOSIS — I5189 Other ill-defined heart diseases: Secondary | ICD-10-CM | POA: Diagnosis not present

## 2020-11-17 DIAGNOSIS — M48061 Spinal stenosis, lumbar region without neurogenic claudication: Secondary | ICD-10-CM | POA: Diagnosis not present

## 2020-11-17 DIAGNOSIS — E876 Hypokalemia: Secondary | ICD-10-CM | POA: Diagnosis not present

## 2020-11-17 DIAGNOSIS — E1169 Type 2 diabetes mellitus with other specified complication: Secondary | ICD-10-CM | POA: Diagnosis not present

## 2020-11-17 DIAGNOSIS — R32 Unspecified urinary incontinence: Secondary | ICD-10-CM | POA: Diagnosis not present

## 2020-11-17 DIAGNOSIS — Z981 Arthrodesis status: Secondary | ICD-10-CM | POA: Diagnosis not present

## 2020-11-17 DIAGNOSIS — I152 Hypertension secondary to endocrine disorders: Secondary | ICD-10-CM | POA: Diagnosis not present

## 2020-11-17 DIAGNOSIS — N390 Urinary tract infection, site not specified: Secondary | ICD-10-CM | POA: Diagnosis not present

## 2020-11-17 DIAGNOSIS — B9620 Unspecified Escherichia coli [E. coli] as the cause of diseases classified elsewhere: Secondary | ICD-10-CM | POA: Diagnosis not present

## 2020-11-17 DIAGNOSIS — I5189 Other ill-defined heart diseases: Secondary | ICD-10-CM | POA: Diagnosis not present

## 2020-11-17 DIAGNOSIS — E1159 Type 2 diabetes mellitus with other circulatory complications: Secondary | ICD-10-CM | POA: Diagnosis not present

## 2020-11-20 ENCOUNTER — Other Ambulatory Visit: Payer: Self-pay | Admitting: Adult Health

## 2020-11-21 DIAGNOSIS — R32 Unspecified urinary incontinence: Secondary | ICD-10-CM | POA: Diagnosis not present

## 2020-11-21 DIAGNOSIS — E1159 Type 2 diabetes mellitus with other circulatory complications: Secondary | ICD-10-CM | POA: Diagnosis not present

## 2020-11-21 DIAGNOSIS — Z981 Arthrodesis status: Secondary | ICD-10-CM | POA: Diagnosis not present

## 2020-11-21 DIAGNOSIS — E1169 Type 2 diabetes mellitus with other specified complication: Secondary | ICD-10-CM | POA: Diagnosis not present

## 2020-11-21 DIAGNOSIS — B9620 Unspecified Escherichia coli [E. coli] as the cause of diseases classified elsewhere: Secondary | ICD-10-CM | POA: Diagnosis not present

## 2020-11-21 DIAGNOSIS — M48061 Spinal stenosis, lumbar region without neurogenic claudication: Secondary | ICD-10-CM | POA: Diagnosis not present

## 2020-11-21 DIAGNOSIS — N390 Urinary tract infection, site not specified: Secondary | ICD-10-CM | POA: Diagnosis not present

## 2020-11-21 DIAGNOSIS — E876 Hypokalemia: Secondary | ICD-10-CM | POA: Diagnosis not present

## 2020-11-21 DIAGNOSIS — I152 Hypertension secondary to endocrine disorders: Secondary | ICD-10-CM | POA: Diagnosis not present

## 2020-11-21 DIAGNOSIS — I5189 Other ill-defined heart diseases: Secondary | ICD-10-CM | POA: Diagnosis not present

## 2020-11-29 DIAGNOSIS — G20 Parkinson's disease: Secondary | ICD-10-CM | POA: Diagnosis not present

## 2020-11-29 DIAGNOSIS — G4700 Insomnia, unspecified: Secondary | ICD-10-CM | POA: Diagnosis not present

## 2020-11-29 DIAGNOSIS — M5459 Other low back pain: Secondary | ICD-10-CM | POA: Diagnosis not present

## 2020-11-29 DIAGNOSIS — G894 Chronic pain syndrome: Secondary | ICD-10-CM | POA: Diagnosis not present

## 2020-11-29 DIAGNOSIS — R252 Cramp and spasm: Secondary | ICD-10-CM | POA: Diagnosis not present

## 2020-11-29 DIAGNOSIS — M25551 Pain in right hip: Secondary | ICD-10-CM | POA: Diagnosis not present

## 2020-11-29 DIAGNOSIS — R278 Other lack of coordination: Secondary | ICD-10-CM | POA: Diagnosis not present

## 2020-11-29 DIAGNOSIS — M25552 Pain in left hip: Secondary | ICD-10-CM | POA: Diagnosis not present

## 2020-11-29 DIAGNOSIS — M961 Postlaminectomy syndrome, not elsewhere classified: Secondary | ICD-10-CM | POA: Diagnosis not present

## 2020-11-29 DIAGNOSIS — G4762 Sleep related leg cramps: Secondary | ICD-10-CM | POA: Diagnosis not present

## 2020-11-29 DIAGNOSIS — E1142 Type 2 diabetes mellitus with diabetic polyneuropathy: Secondary | ICD-10-CM | POA: Diagnosis not present

## 2020-12-01 ENCOUNTER — Encounter: Payer: Self-pay | Admitting: Orthopedic Surgery

## 2020-12-06 DIAGNOSIS — Z09 Encounter for follow-up examination after completed treatment for conditions other than malignant neoplasm: Secondary | ICD-10-CM | POA: Diagnosis not present

## 2020-12-06 DIAGNOSIS — I10 Essential (primary) hypertension: Secondary | ICD-10-CM | POA: Diagnosis not present

## 2020-12-06 DIAGNOSIS — M48061 Spinal stenosis, lumbar region without neurogenic claudication: Secondary | ICD-10-CM | POA: Diagnosis not present

## 2020-12-06 DIAGNOSIS — E1165 Type 2 diabetes mellitus with hyperglycemia: Secondary | ICD-10-CM | POA: Diagnosis not present

## 2020-12-06 DIAGNOSIS — Z299 Encounter for prophylactic measures, unspecified: Secondary | ICD-10-CM | POA: Diagnosis not present

## 2020-12-06 DIAGNOSIS — E440 Moderate protein-calorie malnutrition: Secondary | ICD-10-CM | POA: Diagnosis not present

## 2020-12-18 ENCOUNTER — Ambulatory Visit: Payer: Medicare HMO | Admitting: Orthopedic Surgery

## 2021-01-02 DIAGNOSIS — E1165 Type 2 diabetes mellitus with hyperglycemia: Secondary | ICD-10-CM | POA: Diagnosis not present

## 2021-01-02 DIAGNOSIS — R600 Localized edema: Secondary | ICD-10-CM | POA: Diagnosis not present

## 2021-01-02 DIAGNOSIS — I10 Essential (primary) hypertension: Secondary | ICD-10-CM | POA: Diagnosis not present

## 2021-01-02 DIAGNOSIS — N390 Urinary tract infection, site not specified: Secondary | ICD-10-CM | POA: Diagnosis not present

## 2021-01-02 DIAGNOSIS — Z299 Encounter for prophylactic measures, unspecified: Secondary | ICD-10-CM | POA: Diagnosis not present

## 2021-01-03 DIAGNOSIS — I10 Essential (primary) hypertension: Secondary | ICD-10-CM | POA: Diagnosis not present

## 2021-01-03 DIAGNOSIS — E669 Obesity, unspecified: Secondary | ICD-10-CM | POA: Diagnosis not present

## 2021-01-12 DIAGNOSIS — R5383 Other fatigue: Secondary | ICD-10-CM | POA: Diagnosis not present

## 2021-01-12 DIAGNOSIS — Z299 Encounter for prophylactic measures, unspecified: Secondary | ICD-10-CM | POA: Diagnosis not present

## 2021-01-12 DIAGNOSIS — Z0000 Encounter for general adult medical examination without abnormal findings: Secondary | ICD-10-CM | POA: Diagnosis not present

## 2021-01-12 DIAGNOSIS — Z1331 Encounter for screening for depression: Secondary | ICD-10-CM | POA: Diagnosis not present

## 2021-01-12 DIAGNOSIS — I10 Essential (primary) hypertension: Secondary | ICD-10-CM | POA: Diagnosis not present

## 2021-01-12 DIAGNOSIS — G8929 Other chronic pain: Secondary | ICD-10-CM | POA: Diagnosis not present

## 2021-01-12 DIAGNOSIS — Z6833 Body mass index (BMI) 33.0-33.9, adult: Secondary | ICD-10-CM | POA: Diagnosis not present

## 2021-01-12 DIAGNOSIS — Z7189 Other specified counseling: Secondary | ICD-10-CM | POA: Diagnosis not present

## 2021-01-12 DIAGNOSIS — E559 Vitamin D deficiency, unspecified: Secondary | ICD-10-CM | POA: Diagnosis not present

## 2021-01-12 DIAGNOSIS — D692 Other nonthrombocytopenic purpura: Secondary | ICD-10-CM | POA: Diagnosis not present

## 2021-01-12 DIAGNOSIS — M545 Low back pain, unspecified: Secondary | ICD-10-CM | POA: Diagnosis not present

## 2021-01-12 DIAGNOSIS — Z1339 Encounter for screening examination for other mental health and behavioral disorders: Secondary | ICD-10-CM | POA: Diagnosis not present

## 2021-01-13 DIAGNOSIS — L03116 Cellulitis of left lower limb: Secondary | ICD-10-CM | POA: Diagnosis not present

## 2021-01-26 DIAGNOSIS — E119 Type 2 diabetes mellitus without complications: Secondary | ICD-10-CM | POA: Diagnosis not present

## 2021-01-26 DIAGNOSIS — R531 Weakness: Secondary | ICD-10-CM | POA: Diagnosis not present

## 2021-01-26 DIAGNOSIS — Z853 Personal history of malignant neoplasm of breast: Secondary | ICD-10-CM | POA: Diagnosis not present

## 2021-01-26 DIAGNOSIS — Z743 Need for continuous supervision: Secondary | ICD-10-CM | POA: Diagnosis not present

## 2021-01-26 DIAGNOSIS — U071 COVID-19: Secondary | ICD-10-CM | POA: Diagnosis not present

## 2021-01-26 DIAGNOSIS — E7800 Pure hypercholesterolemia, unspecified: Secondary | ICD-10-CM | POA: Diagnosis not present

## 2021-01-26 DIAGNOSIS — R059 Cough, unspecified: Secondary | ICD-10-CM | POA: Diagnosis not present

## 2021-01-26 DIAGNOSIS — R0981 Nasal congestion: Secondary | ICD-10-CM | POA: Diagnosis not present

## 2021-01-26 DIAGNOSIS — R509 Fever, unspecified: Secondary | ICD-10-CM | POA: Diagnosis not present

## 2021-01-26 DIAGNOSIS — I10 Essential (primary) hypertension: Secondary | ICD-10-CM | POA: Diagnosis not present

## 2021-01-26 DIAGNOSIS — R5381 Other malaise: Secondary | ICD-10-CM | POA: Diagnosis not present

## 2021-01-26 DIAGNOSIS — Z79899 Other long term (current) drug therapy: Secondary | ICD-10-CM | POA: Diagnosis not present

## 2021-01-26 DIAGNOSIS — E860 Dehydration: Secondary | ICD-10-CM | POA: Diagnosis not present

## 2021-02-20 DIAGNOSIS — G894 Chronic pain syndrome: Secondary | ICD-10-CM | POA: Diagnosis not present

## 2021-02-20 DIAGNOSIS — E1142 Type 2 diabetes mellitus with diabetic polyneuropathy: Secondary | ICD-10-CM | POA: Diagnosis not present

## 2021-02-20 DIAGNOSIS — M961 Postlaminectomy syndrome, not elsewhere classified: Secondary | ICD-10-CM | POA: Diagnosis not present

## 2021-02-20 DIAGNOSIS — G20 Parkinson's disease: Secondary | ICD-10-CM | POA: Diagnosis not present

## 2021-03-05 ENCOUNTER — Other Ambulatory Visit: Payer: Self-pay | Admitting: Adult Health

## 2021-04-13 ENCOUNTER — Other Ambulatory Visit: Payer: Self-pay

## 2021-04-13 ENCOUNTER — Emergency Department (HOSPITAL_COMMUNITY): Payer: Medicare HMO

## 2021-04-13 ENCOUNTER — Emergency Department (HOSPITAL_COMMUNITY)
Admission: EM | Admit: 2021-04-13 | Discharge: 2021-04-13 | Disposition: A | Discharge status: Home / Self Care | Payer: Medicare HMO | Attending: Student | Admitting: Student

## 2021-04-13 ENCOUNTER — Encounter (HOSPITAL_COMMUNITY): Payer: Self-pay

## 2021-04-13 DIAGNOSIS — W19XXXA Unspecified fall, initial encounter: Secondary | ICD-10-CM

## 2021-04-13 DIAGNOSIS — R52 Pain, unspecified: Secondary | ICD-10-CM | POA: Diagnosis not present

## 2021-04-13 DIAGNOSIS — S8991XA Unspecified injury of right lower leg, initial encounter: Secondary | ICD-10-CM | POA: Diagnosis not present

## 2021-04-13 DIAGNOSIS — Z79899 Other long term (current) drug therapy: Secondary | ICD-10-CM | POA: Diagnosis not present

## 2021-04-13 DIAGNOSIS — Z7984 Long term (current) use of oral hypoglycemic drugs: Secondary | ICD-10-CM | POA: Insufficient documentation

## 2021-04-13 DIAGNOSIS — M545 Low back pain, unspecified: Secondary | ICD-10-CM | POA: Diagnosis not present

## 2021-04-13 DIAGNOSIS — Z794 Long term (current) use of insulin: Secondary | ICD-10-CM | POA: Diagnosis not present

## 2021-04-13 DIAGNOSIS — M25562 Pain in left knee: Secondary | ICD-10-CM | POA: Insufficient documentation

## 2021-04-13 DIAGNOSIS — Z853 Personal history of malignant neoplasm of breast: Secondary | ICD-10-CM | POA: Insufficient documentation

## 2021-04-13 DIAGNOSIS — M25569 Pain in unspecified knee: Secondary | ICD-10-CM

## 2021-04-13 DIAGNOSIS — W07XXXA Fall from chair, initial encounter: Secondary | ICD-10-CM | POA: Insufficient documentation

## 2021-04-13 DIAGNOSIS — M549 Dorsalgia, unspecified: Secondary | ICD-10-CM | POA: Diagnosis not present

## 2021-04-13 DIAGNOSIS — S8001XA Contusion of right knee, initial encounter: Secondary | ICD-10-CM | POA: Diagnosis not present

## 2021-04-13 DIAGNOSIS — M546 Pain in thoracic spine: Secondary | ICD-10-CM | POA: Diagnosis not present

## 2021-04-13 DIAGNOSIS — M25561 Pain in right knee: Secondary | ICD-10-CM | POA: Diagnosis not present

## 2021-04-13 DIAGNOSIS — E1165 Type 2 diabetes mellitus with hyperglycemia: Secondary | ICD-10-CM | POA: Insufficient documentation

## 2021-04-13 DIAGNOSIS — Z743 Need for continuous supervision: Secondary | ICD-10-CM | POA: Diagnosis not present

## 2021-04-13 DIAGNOSIS — I10 Essential (primary) hypertension: Secondary | ICD-10-CM | POA: Diagnosis not present

## 2021-04-13 LAB — BASIC METABOLIC PANEL
Anion gap: 8 (ref 5–15)
BUN: 13 mg/dL (ref 8–23)
CO2: 27 mmol/L (ref 22–32)
Calcium: 9.3 mg/dL (ref 8.9–10.3)
Chloride: 105 mmol/L (ref 98–111)
Creatinine, Ser: 0.75 mg/dL (ref 0.44–1.00)
GFR, Estimated: >60 mL/min (ref >60)
Glucose, Bld: 127 mg/dL — ABNORMAL HIGH (ref 70–99)
Potassium: 3.5 mmol/L (ref 3.5–5.1)
Sodium: 140 mmol/L (ref 135–145)

## 2021-04-13 LAB — CBC WITH DIFFERENTIAL/PLATELET
Abs Immature Granulocytes: 0 K/uL (ref 0.00–0.07)
Basophils Absolute: 0 K/uL (ref 0.0–0.1)
Basophils Relative: 1 %
Eosinophils Absolute: 0.1 K/uL (ref 0.0–0.5)
Eosinophils Relative: 2 %
HCT: 41.8 % (ref 36.0–46.0)
Hemoglobin: 13.1 g/dL (ref 12.0–15.0)
Immature Granulocytes: 0 %
Lymphocytes Relative: 17 %
Lymphs Abs: 1 K/uL (ref 0.7–4.0)
MCH: 25.8 pg — ABNORMAL LOW (ref 26.0–34.0)
MCHC: 31.3 g/dL (ref 30.0–36.0)
MCV: 82.4 fL (ref 80.0–100.0)
Monocytes Absolute: 0.3 K/uL (ref 0.1–1.0)
Monocytes Relative: 5 %
Neutro Abs: 4.5 K/uL (ref 1.7–7.7)
Neutrophils Relative %: 75 %
Platelets: 238 K/uL (ref 150–400)
RBC: 5.07 MIL/uL (ref 3.87–5.11)
RDW: 14.4 % (ref 11.5–15.5)
WBC: 5.9 K/uL (ref 4.0–10.5)
nRBC: 0 % (ref 0.0–0.2)

## 2021-04-13 LAB — URINALYSIS, ROUTINE W REFLEX MICROSCOPIC
Bilirubin Urine: NEGATIVE
Glucose, UA: NEGATIVE mg/dL
Ketones, ur: 5 mg/dL — AB
Leukocytes,Ua: NEGATIVE
Nitrite: NEGATIVE
Protein, ur: 30 mg/dL — AB
Specific Gravity, Urine: 1.012 (ref 1.005–1.030)
pH: 6 (ref 5.0–8.0)

## 2021-04-13 LAB — CK: Total CK: 192 U/L (ref 38–234)

## 2021-04-13 NOTE — ED Triage Notes (Signed)
Patient has frequent falls, fell yesterday in the shower and sat there for 4.5 hours until grandson helped her.  Today she feel after getting up from chair.  Denies dizziness.  Complains of back pain.  Denies LOC or hitting head.  Patient is seeing a neurologist for increased weakness ?

## 2021-04-13 NOTE — ED Provider Notes (Signed)
[facility] EMERGENCY DEPARTMENT Provider Note   CSN: [id number] Arrival date & time: 04/13/21  1040     History Chief Complaint  Patient presents with   Vanessa Stephens    Vanessa Stephens is a 71 y.o. female with history of frequent falls who presents to the emergency department with 2 falls in the last 24 hours.  Patient states that she was taking a shower when she got generally weak and dropped to the floor.  She laid there approximately 5 hours before anybody was able to get her.  Was able to get herself up.  Had another fall while getting out of the chair this morning and fell onto her knees.  Patient complaining of bilateral knee pain and mid and low back pain.  She did not hit her head or lose conscious.  She is not on any anticoagulation.   Fall      Home Medications Prior to Admission medications   Medication Sig Start Date End Date Taking? Authorizing Provider  acetaminophen (TYLENOL) 325 MG tablet Take 2 tablets (650 mg total) by mouth every 6 (six) hours as needed for mild pain (or Fever >/= 101). 09/28/20   Roxan Hockey, MD  DULoxetine (CYMBALTA) 60 MG capsule Take 1 capsule (60 mg total) by mouth daily. 10/16/20   Gerlene Fee, NP  insulin aspart (NOVOLOG) 100 UNIT/ML FlexPen Inject 5 Units into the skin 3 (three) times daily with meals. Special Instructions: Give 5 units if cbg > 150. DO NOT GIVE MORE THAN 10 MINUTES BEFORE A MEAL 10/16/20   Gerlene Fee, NP  insulin detemir (LEVEMIR) 100 UNIT/ML injection Inject 0.1 mLs (10 Units total) into the skin daily. 10/16/20   Gerlene Fee, NP  lisinopril-hydrochlorothiazide (ZESTORETIC) 20-12.5 MG tablet Take 1 tablet by mouth daily. 10/16/20   Gerlene Fee, NP  metFORMIN (GLUCOPHAGE) 500 MG tablet Take 2 tablets (1,000 mg total) by mouth in the morning and at bedtime. 10/16/20   Gerlene Fee, NP  methocarbamol (ROBAXIN) 500 MG tablet Take 1 tablet (500 mg total) by mouth 4 (four) times daily. 10/16/20   Gerlene Fee, NP  metoprolol tartrate (LOPRESSOR) 50 MG tablet Take 1 tablet (50 mg total) by mouth daily. 10/16/20   Gerlene Fee, NP  NON FORMULARY Diet:NAS and Regular Liquids    [provider]  polyethylene glycol (MIRALAX / GLYCOLAX) 17 g packet Take 17 g by mouth daily. 09/28/20   Roxan Hockey, MD  potassium chloride (KLOR-CON) 10 MEQ tablet Take 1 tablet (10 mEq total) by mouth daily. Take While taking HCTZ 10/16/20   Gerlene Fee, NP  rosuvastatin (CRESTOR) 5 MG tablet Take 1 tablet (5 mg total) by mouth daily. 10/16/20   Gerlene Fee, NP  senna-docusate (SENOKOT-S) 8.6-50 MG tablet Take 2 tablets by mouth at bedtime. 09/28/20 09/28/21  Roxan Hockey, MD      Allergies    Codeine    Review of Systems   Review of Systems  All other systems reviewed and are negative.  Physical Exam Updated Vital Signs BP (!) 206/82    Pulse 83    Temp 98.3 F (36.8 C) (Oral)    Resp 16    Ht '5\' 4"'$  (1.626 m)    Wt 79.4 kg    SpO2 98%    BMI 30.04 kg/m  Physical Exam Vitals and nursing note reviewed.  Constitutional:      General: She is not in acute distress.  Appearance: Normal appearance.  HENT:     Head: Normocephalic and atraumatic. No raccoon eyes or Battle's sign.  Eyes:     General:        Right eye: No discharge.        Left eye: No discharge.  Cardiovascular:     Comments: Regular rate and rhythm.  S1/S2 are distinct without any evidence of murmur, rubs, or gallops.  Pulmonary:     Comments: Clear to auscultation bilaterally.  Normal effort.  No respiratory distress.  No evidence of wheezes, rales, or rhonchi heard throughout. Abdominal:     General: Abdomen is flat. Bowel sounds are normal. There is no distension.     Tenderness: There is no abdominal tenderness. There is no guarding or rebound.  Musculoskeletal:        General: Normal range of motion.     Cervical back: Neck supple.     Comments: Midline tenderness over the lower thoracic and upper lumbar spine.   Bilateral knees are nontender to palpation.  Skin:    General: Skin is warm and dry.     Findings: No rash.     Comments: Superficial abrasion to the right anterior shin.  There are healing ecchymotic wounds over the right knee.  Neurological:     General: No focal deficit present.     Mental Status: She is alert.  Psychiatric:        Mood and Affect: Mood normal.        Behavior: Behavior normal.    ED Results / Procedures / Treatments   Labs (all labs ordered are listed, but only abnormal results are displayed) Labs Reviewed  CBC WITH DIFFERENTIAL/PLATELET - Abnormal; Notable for the following components:      Result Value   MCH 25.8 (*)    All other components within normal limits  BASIC METABOLIC PANEL - Abnormal; Notable for the following components:   Glucose, Bld 127 (*)    All other components within normal limits  URINALYSIS, ROUTINE W REFLEX MICROSCOPIC - Abnormal; Notable for the following components:   Hgb urine dipstick SMALL (*)    Ketones, ur 5 (*)    Protein, ur 30 (*)    Bacteria, UA RARE (*)    All other components within normal limits  CK    EKG None  Radiology DG Thoracic Spine 2 View  Result Date: 04/13/2021 CLINICAL DATA:  Low back pain after fall. EXAM: THORACIC SPINE 2 VIEWS COMPARISON:  None. FINDINGS: There is no evidence of thoracic spine fracture. Alignment is normal. Ossification of anterior longitudinal ligament is noted. IMPRESSION: No acute abnormality seen. Electronically Signed   By: Marijo Conception M.D.   On: 04/13/2021 12:22   DG Lumbar Spine Complete  Result Date: 04/13/2021 CLINICAL DATA:  Low back pain after fall yesterday. EXAM: LUMBAR SPINE - COMPLETE 4+ VIEW COMPARISON:  Jun 15, 2019. FINDINGS: Status post surgical posterior fusion of L4-5 and L5-S1 with bilateral and patellar screw placement. No fracture or spondylolisthesis is noted. Moderate degenerative disc disease is noted at L1-2, L2-3 and L3-4 with anterior osteophyte  formation. IMPRESSION: Postsurgical and degenerative changes as described above. No acute abnormality seen. Electronically Signed   By: Marijo Conception M.D.   On: 04/13/2021 12:21   DG Knee Complete 4 Views Left  Result Date: 04/13/2021 CLINICAL DATA:  Left knee pain after fall. EXAM: LEFT KNEE - COMPLETE 4+ VIEW COMPARISON:  None. FINDINGS: No evidence of fracture, dislocation, or  joint effusion. No evidence of arthropathy. Mild patellar spurring is noted. Soft tissues are unremarkable. IMPRESSION: No acute abnormality is noted. Electronically Signed   By: Marijo Conception M.D.   On: 04/13/2021 12:25   DG Knee Complete 4 Views Right  Result Date: 04/13/2021 CLINICAL DATA:  Right knee pain after fall yesterday. EXAM: RIGHT KNEE - COMPLETE 4+ VIEW COMPARISON:  None. FINDINGS: No evidence of fracture, dislocation, or joint effusion. Mild narrowing of medial joint space is noted. Mild patellar spurring is noted. Soft tissues are unremarkable. IMPRESSION: Mild degenerative joint disease is noted medially. No acute abnormality seen. Electronically Signed   By: Marijo Conception M.D.   On: 04/13/2021 12:23    Procedures Procedures    Medications Ordered in ED Medications - No data to display  ED Course/ Medical Decision Making/ A&P Clinical Course as of 04/13/21 1330  Fri Apr 13, 2021  1326 I discussed this case with my attending physician who cosigned this note including patient's presenting symptoms, physical exam, and planned diagnostics and interventions. Attending physician stated agreement with plan or made changes to plan which were implemented.    [CF]    Clinical Course User Index [CF] Hendricks Limes, PA-C                           Medical Decision Making Amount and/or Complexity of Data Reviewed Labs: ordered. Radiology: ordered.   This patient presents to the ED for concern of back pain and knee pain secondary to a fall, this involves an extensive number of treatment options,  and is a complaint that carries with it a high risk of complications and morbidity.  The differential diagnosis includes fracture dislocation however I think this is less likely over the knees.  I doubt fracture or dislocation over the spine.  We will plan to get basic labs and a CK level as patient was down for extended amount of time in a cold shower to evaluate for rhabdomyolysis.  Patient not hit her head or lose consciousness and can recall every event over the last 24 hours.  Do not feel that head imaging is warranted at this time.  We will get a UA as patient is complaining of foul smelling urine.   Co morbidities that complicate the patient evaluation  Spinal stenosis Breast cancer history Diabetes Hypertension   Additional history obtained:  Additional history obtained from family at bedside and nursing note External records from outside source obtained and reviewed including [place] neurosurgery visit on 04/13/2021.  I am unable to view those results.   Lab Tests:  I Ordered, and personally interpreted labs.  The pertinent results include: CBC which is without evidence of leukocytosis or anemia.  BMP which was normal apart from elevated glucose.  Urinalysis did not show any signs of infection.  CK was normal.  I doubt rhabdomyolysis.   Imaging Studies ordered:  I ordered imaging studies including x-rays of the right and left knee in addition to the thoracic and lumbar spine I independently visualized and interpreted imaging which showed no fractures or dislocations. I agree with the radiologist interpretation   Cardiac Monitoring:  The patient was maintained on a cardiac monitor.  I personally viewed and interpreted the cardiac monitored which showed an underlying rhythm of: Normal sinus rhythm   Problem List / ED Course:  Fall and back pain.  No evidence of fractures or dislocations.  No specific etiology to point  to her falls in the last 24 hours.  This seems to be a  chronic issue.  She is being followed by neurosurgery for spinal stenosis.  I will have her follow-up with them for further evaluation.  I doubt any cauda equina or severe spinal cord pathology at this time.  I doubt any intracranial hemorrhage.  Patient did not hit her head and has no altered mental status.  Strict return precautions were discussed with the patient.  She is safe for discharge.   Reevaluation:  After the interventions noted above, I reevaluated the patient and found that they have :improved   Social Determinants of Health:  Frequent falls   Dispostion:  After consideration of the diagnostic results and the patients response to treatment, I feel that the patent would benefit from outpatient follow-up with her primary care doctor and neurosurgery.  Final Clinical Impression(s) / ED Diagnoses Final diagnoses:  Knee pain  Fall, initial encounter  Low back pain, unspecified back pain laterality, unspecified chronicity, unspecified whether sciatica present    Rx / DC Orders ED Discharge Orders     None         Hendricks Limes, [place] 04/13/21 1332    Teressa Lower, MD 04/13/21 [phone]

## 2021-04-13 NOTE — ED Notes (Signed)
Patient continues to have frequent urination so placed on purwick. ?

## 2021-04-13 NOTE — Discharge Instructions (Addendum)
Would like you to follow-up with your primary care doctor.  No specific signs of why you are falling that we can find today.  Please return to the emergency department for any worsening symptoms you might have. ?

## 2021-04-13 NOTE — ED Notes (Signed)
Techs helped patient use bed pain upon arrival.  ?

## 2021-04-23 DIAGNOSIS — I10 Essential (primary) hypertension: Secondary | ICD-10-CM | POA: Diagnosis not present

## 2021-04-23 DIAGNOSIS — R296 Repeated falls: Secondary | ICD-10-CM | POA: Diagnosis not present

## 2021-04-23 DIAGNOSIS — G894 Chronic pain syndrome: Secondary | ICD-10-CM | POA: Diagnosis not present

## 2021-04-23 DIAGNOSIS — G20 Parkinson's disease: Secondary | ICD-10-CM | POA: Diagnosis not present

## 2021-04-23 DIAGNOSIS — Z79891 Long term (current) use of opiate analgesic: Secondary | ICD-10-CM | POA: Diagnosis not present

## 2021-04-23 DIAGNOSIS — M961 Postlaminectomy syndrome, not elsewhere classified: Secondary | ICD-10-CM | POA: Diagnosis not present

## 2021-04-23 DIAGNOSIS — G8313 Monoplegia of lower limb affecting right nondominant side: Secondary | ICD-10-CM | POA: Diagnosis not present

## 2021-05-03 DIAGNOSIS — I10 Essential (primary) hypertension: Secondary | ICD-10-CM | POA: Diagnosis not present

## 2021-05-03 DIAGNOSIS — E669 Obesity, unspecified: Secondary | ICD-10-CM | POA: Diagnosis not present

## 2021-05-09 DIAGNOSIS — W19XXXA Unspecified fall, initial encounter: Secondary | ICD-10-CM | POA: Diagnosis not present

## 2021-05-09 DIAGNOSIS — R29898 Other symptoms and signs involving the musculoskeletal system: Secondary | ICD-10-CM | POA: Diagnosis not present

## 2021-05-09 DIAGNOSIS — R269 Unspecified abnormalities of gait and mobility: Secondary | ICD-10-CM | POA: Diagnosis not present

## 2021-05-10 ENCOUNTER — Other Ambulatory Visit (HOSPITAL_COMMUNITY): Payer: Self-pay | Admitting: Neurology

## 2021-05-10 ENCOUNTER — Other Ambulatory Visit: Payer: Self-pay | Admitting: Neurology

## 2021-05-10 DIAGNOSIS — R296 Repeated falls: Secondary | ICD-10-CM

## 2021-05-10 DIAGNOSIS — R2689 Other abnormalities of gait and mobility: Secondary | ICD-10-CM

## 2021-05-15 DIAGNOSIS — W19XXXA Unspecified fall, initial encounter: Secondary | ICD-10-CM | POA: Diagnosis not present

## 2021-05-15 DIAGNOSIS — R29898 Other symptoms and signs involving the musculoskeletal system: Secondary | ICD-10-CM | POA: Diagnosis not present

## 2021-05-15 DIAGNOSIS — R269 Unspecified abnormalities of gait and mobility: Secondary | ICD-10-CM | POA: Diagnosis not present

## 2021-05-17 DIAGNOSIS — W19XXXA Unspecified fall, initial encounter: Secondary | ICD-10-CM | POA: Diagnosis not present

## 2021-05-17 DIAGNOSIS — R269 Unspecified abnormalities of gait and mobility: Secondary | ICD-10-CM | POA: Diagnosis not present

## 2021-05-17 DIAGNOSIS — R29898 Other symptoms and signs involving the musculoskeletal system: Secondary | ICD-10-CM | POA: Diagnosis not present

## 2021-05-23 DIAGNOSIS — R29898 Other symptoms and signs involving the musculoskeletal system: Secondary | ICD-10-CM | POA: Diagnosis not present

## 2021-05-23 DIAGNOSIS — W19XXXA Unspecified fall, initial encounter: Secondary | ICD-10-CM | POA: Diagnosis not present

## 2021-05-23 DIAGNOSIS — R269 Unspecified abnormalities of gait and mobility: Secondary | ICD-10-CM | POA: Diagnosis not present

## 2021-05-25 DIAGNOSIS — R29898 Other symptoms and signs involving the musculoskeletal system: Secondary | ICD-10-CM | POA: Diagnosis not present

## 2021-05-25 DIAGNOSIS — R269 Unspecified abnormalities of gait and mobility: Secondary | ICD-10-CM | POA: Diagnosis not present

## 2021-05-25 DIAGNOSIS — W19XXXA Unspecified fall, initial encounter: Secondary | ICD-10-CM | POA: Diagnosis not present

## 2021-05-30 ENCOUNTER — Ambulatory Visit (HOSPITAL_COMMUNITY)
Admission: RE | Admit: 2021-05-30 | Discharge: 2021-05-30 | Disposition: A | Discharge status: Home / Self Care | Payer: Medicare HMO | Source: Ambulatory Visit | Attending: Neurology | Admitting: Neurology

## 2021-05-30 DIAGNOSIS — R251 Tremor, unspecified: Secondary | ICD-10-CM | POA: Diagnosis not present

## 2021-05-30 DIAGNOSIS — M4802 Spinal stenosis, cervical region: Secondary | ICD-10-CM | POA: Diagnosis not present

## 2021-05-30 DIAGNOSIS — R2689 Other abnormalities of gait and mobility: Secondary | ICD-10-CM | POA: Insufficient documentation

## 2021-05-30 DIAGNOSIS — R296 Repeated falls: Secondary | ICD-10-CM | POA: Diagnosis not present

## 2021-05-30 DIAGNOSIS — I739 Peripheral vascular disease, unspecified: Secondary | ICD-10-CM | POA: Diagnosis not present

## 2021-05-30 DIAGNOSIS — I639 Cerebral infarction, unspecified: Secondary | ICD-10-CM | POA: Diagnosis not present

## 2021-06-04 DIAGNOSIS — X58XXXA Exposure to other specified factors, initial encounter: Secondary | ICD-10-CM | POA: Diagnosis not present

## 2021-06-04 DIAGNOSIS — W19XXXA Unspecified fall, initial encounter: Secondary | ICD-10-CM | POA: Diagnosis not present

## 2021-06-04 DIAGNOSIS — S81812A Laceration without foreign body, left lower leg, initial encounter: Secondary | ICD-10-CM | POA: Diagnosis not present

## 2021-06-04 DIAGNOSIS — Z9221 Personal history of antineoplastic chemotherapy: Secondary | ICD-10-CM | POA: Diagnosis not present

## 2021-06-04 DIAGNOSIS — Z923 Personal history of irradiation: Secondary | ICD-10-CM | POA: Diagnosis not present

## 2021-06-04 DIAGNOSIS — Z79899 Other long term (current) drug therapy: Secondary | ICD-10-CM | POA: Diagnosis not present

## 2021-06-04 DIAGNOSIS — Z794 Long term (current) use of insulin: Secondary | ICD-10-CM | POA: Diagnosis not present

## 2021-06-04 DIAGNOSIS — X501XXA Overexertion from prolonged static or awkward postures, initial encounter: Secondary | ICD-10-CM | POA: Diagnosis not present

## 2021-06-04 DIAGNOSIS — Z853 Personal history of malignant neoplasm of breast: Secondary | ICD-10-CM | POA: Diagnosis not present

## 2021-06-04 DIAGNOSIS — R29898 Other symptoms and signs involving the musculoskeletal system: Secondary | ICD-10-CM | POA: Diagnosis not present

## 2021-06-04 DIAGNOSIS — R269 Unspecified abnormalities of gait and mobility: Secondary | ICD-10-CM | POA: Diagnosis not present

## 2021-06-07 DIAGNOSIS — R29898 Other symptoms and signs involving the musculoskeletal system: Secondary | ICD-10-CM | POA: Diagnosis not present

## 2021-06-07 DIAGNOSIS — W19XXXA Unspecified fall, initial encounter: Secondary | ICD-10-CM | POA: Diagnosis not present

## 2021-06-07 DIAGNOSIS — R269 Unspecified abnormalities of gait and mobility: Secondary | ICD-10-CM | POA: Diagnosis not present

## 2021-06-11 DIAGNOSIS — R29898 Other symptoms and signs involving the musculoskeletal system: Secondary | ICD-10-CM | POA: Diagnosis not present

## 2021-06-11 DIAGNOSIS — W19XXXA Unspecified fall, initial encounter: Secondary | ICD-10-CM | POA: Diagnosis not present

## 2021-06-11 DIAGNOSIS — R269 Unspecified abnormalities of gait and mobility: Secondary | ICD-10-CM | POA: Diagnosis not present

## 2021-06-13 DIAGNOSIS — I10 Essential (primary) hypertension: Secondary | ICD-10-CM | POA: Diagnosis not present

## 2021-06-13 DIAGNOSIS — Z6833 Body mass index (BMI) 33.0-33.9, adult: Secondary | ICD-10-CM | POA: Diagnosis not present

## 2021-06-13 DIAGNOSIS — S81819A Laceration without foreign body, unspecified lower leg, initial encounter: Secondary | ICD-10-CM | POA: Diagnosis not present

## 2021-06-13 DIAGNOSIS — Z299 Encounter for prophylactic measures, unspecified: Secondary | ICD-10-CM | POA: Diagnosis not present

## 2021-06-13 DIAGNOSIS — Z713 Dietary counseling and surveillance: Secondary | ICD-10-CM | POA: Diagnosis not present

## 2021-06-18 DIAGNOSIS — G20 Parkinson's disease: Secondary | ICD-10-CM | POA: Diagnosis not present

## 2021-06-18 DIAGNOSIS — R296 Repeated falls: Secondary | ICD-10-CM | POA: Diagnosis not present

## 2021-06-18 DIAGNOSIS — Z79891 Long term (current) use of opiate analgesic: Secondary | ICD-10-CM | POA: Diagnosis not present

## 2021-06-18 DIAGNOSIS — M5459 Other low back pain: Secondary | ICD-10-CM | POA: Diagnosis not present

## 2021-06-18 DIAGNOSIS — G894 Chronic pain syndrome: Secondary | ICD-10-CM | POA: Diagnosis not present

## 2021-06-18 DIAGNOSIS — M961 Postlaminectomy syndrome, not elsewhere classified: Secondary | ICD-10-CM | POA: Diagnosis not present

## 2021-06-21 DIAGNOSIS — S81812D Laceration without foreign body, left lower leg, subsequent encounter: Secondary | ICD-10-CM | POA: Diagnosis not present

## 2021-06-21 DIAGNOSIS — S81812A Laceration without foreign body, left lower leg, initial encounter: Secondary | ICD-10-CM | POA: Diagnosis not present

## 2021-06-21 DIAGNOSIS — I10 Essential (primary) hypertension: Secondary | ICD-10-CM | POA: Diagnosis not present

## 2021-06-21 DIAGNOSIS — Z6833 Body mass index (BMI) 33.0-33.9, adult: Secondary | ICD-10-CM | POA: Diagnosis not present

## 2021-06-21 DIAGNOSIS — Z299 Encounter for prophylactic measures, unspecified: Secondary | ICD-10-CM | POA: Diagnosis not present

## 2021-06-21 DIAGNOSIS — Z4802 Encounter for removal of sutures: Secondary | ICD-10-CM | POA: Diagnosis not present

## 2021-06-21 DIAGNOSIS — E1165 Type 2 diabetes mellitus with hyperglycemia: Secondary | ICD-10-CM | POA: Diagnosis not present

## 2021-06-26 DIAGNOSIS — R600 Localized edema: Secondary | ICD-10-CM | POA: Diagnosis not present

## 2021-06-26 DIAGNOSIS — I10 Essential (primary) hypertension: Secondary | ICD-10-CM | POA: Diagnosis not present

## 2021-06-26 DIAGNOSIS — M21372 Foot drop, left foot: Secondary | ICD-10-CM | POA: Diagnosis not present

## 2021-06-26 DIAGNOSIS — E785 Hyperlipidemia, unspecified: Secondary | ICD-10-CM | POA: Diagnosis not present

## 2021-06-26 DIAGNOSIS — G8929 Other chronic pain: Secondary | ICD-10-CM | POA: Diagnosis not present

## 2021-06-26 DIAGNOSIS — S91002A Unspecified open wound, left ankle, initial encounter: Secondary | ICD-10-CM | POA: Diagnosis not present

## 2021-06-26 DIAGNOSIS — M549 Dorsalgia, unspecified: Secondary | ICD-10-CM | POA: Diagnosis not present

## 2021-06-26 DIAGNOSIS — Z7984 Long term (current) use of oral hypoglycemic drugs: Secondary | ICD-10-CM | POA: Diagnosis not present

## 2021-06-26 DIAGNOSIS — G20 Parkinson's disease: Secondary | ICD-10-CM | POA: Diagnosis not present

## 2021-06-26 DIAGNOSIS — E11622 Type 2 diabetes mellitus with other skin ulcer: Secondary | ICD-10-CM | POA: Diagnosis not present

## 2021-06-26 DIAGNOSIS — Z885 Allergy status to narcotic agent status: Secondary | ICD-10-CM | POA: Diagnosis not present

## 2021-06-26 DIAGNOSIS — S81801A Unspecified open wound, right lower leg, initial encounter: Secondary | ICD-10-CM | POA: Diagnosis not present

## 2021-06-26 DIAGNOSIS — S81802A Unspecified open wound, left lower leg, initial encounter: Secondary | ICD-10-CM | POA: Diagnosis not present

## 2021-06-27 DIAGNOSIS — W19XXXA Unspecified fall, initial encounter: Secondary | ICD-10-CM | POA: Diagnosis not present

## 2021-06-27 DIAGNOSIS — R29898 Other symptoms and signs involving the musculoskeletal system: Secondary | ICD-10-CM | POA: Diagnosis not present

## 2021-06-27 DIAGNOSIS — R269 Unspecified abnormalities of gait and mobility: Secondary | ICD-10-CM | POA: Diagnosis not present

## 2021-07-03 DIAGNOSIS — S91002A Unspecified open wound, left ankle, initial encounter: Secondary | ICD-10-CM | POA: Diagnosis not present

## 2021-07-05 DIAGNOSIS — R269 Unspecified abnormalities of gait and mobility: Secondary | ICD-10-CM | POA: Diagnosis not present

## 2021-07-05 DIAGNOSIS — R29898 Other symptoms and signs involving the musculoskeletal system: Secondary | ICD-10-CM | POA: Diagnosis not present

## 2021-07-05 DIAGNOSIS — W19XXXA Unspecified fall, initial encounter: Secondary | ICD-10-CM | POA: Diagnosis not present

## 2021-07-10 DIAGNOSIS — R29898 Other symptoms and signs involving the musculoskeletal system: Secondary | ICD-10-CM | POA: Diagnosis not present

## 2021-07-10 DIAGNOSIS — R269 Unspecified abnormalities of gait and mobility: Secondary | ICD-10-CM | POA: Diagnosis not present

## 2021-07-10 DIAGNOSIS — W19XXXA Unspecified fall, initial encounter: Secondary | ICD-10-CM | POA: Diagnosis not present

## 2021-07-12 DIAGNOSIS — X58XXXA Exposure to other specified factors, initial encounter: Secondary | ICD-10-CM | POA: Diagnosis not present

## 2021-07-12 DIAGNOSIS — S81802A Unspecified open wound, left lower leg, initial encounter: Secondary | ICD-10-CM | POA: Diagnosis not present

## 2021-07-13 DIAGNOSIS — R269 Unspecified abnormalities of gait and mobility: Secondary | ICD-10-CM | POA: Diagnosis not present

## 2021-07-13 DIAGNOSIS — R29898 Other symptoms and signs involving the musculoskeletal system: Secondary | ICD-10-CM | POA: Diagnosis not present

## 2021-07-13 DIAGNOSIS — W19XXXA Unspecified fall, initial encounter: Secondary | ICD-10-CM | POA: Diagnosis not present

## 2021-07-16 DIAGNOSIS — S81802A Unspecified open wound, left lower leg, initial encounter: Secondary | ICD-10-CM | POA: Diagnosis not present

## 2021-07-16 DIAGNOSIS — E785 Hyperlipidemia, unspecified: Secondary | ICD-10-CM | POA: Diagnosis not present

## 2021-07-16 DIAGNOSIS — E11622 Type 2 diabetes mellitus with other skin ulcer: Secondary | ICD-10-CM | POA: Diagnosis not present

## 2021-07-16 DIAGNOSIS — Z20822 Contact with and (suspected) exposure to covid-19: Secondary | ICD-10-CM | POA: Diagnosis not present

## 2021-07-16 DIAGNOSIS — E119 Type 2 diabetes mellitus without complications: Secondary | ICD-10-CM | POA: Diagnosis not present

## 2021-07-16 DIAGNOSIS — G8929 Other chronic pain: Secondary | ICD-10-CM | POA: Diagnosis not present

## 2021-07-16 DIAGNOSIS — G20 Parkinson's disease: Secondary | ICD-10-CM | POA: Diagnosis not present

## 2021-07-16 DIAGNOSIS — R55 Syncope and collapse: Secondary | ICD-10-CM | POA: Diagnosis not present

## 2021-07-16 DIAGNOSIS — L97929 Non-pressure chronic ulcer of unspecified part of left lower leg with unspecified severity: Secondary | ICD-10-CM | POA: Diagnosis not present

## 2021-07-16 DIAGNOSIS — W19XXXA Unspecified fall, initial encounter: Secondary | ICD-10-CM | POA: Diagnosis not present

## 2021-07-16 DIAGNOSIS — Z8673 Personal history of transient ischemic attack (TIA), and cerebral infarction without residual deficits: Secondary | ICD-10-CM | POA: Diagnosis not present

## 2021-07-16 DIAGNOSIS — Z6831 Body mass index (BMI) 31.0-31.9, adult: Secondary | ICD-10-CM | POA: Diagnosis not present

## 2021-07-16 DIAGNOSIS — Z79899 Other long term (current) drug therapy: Secondary | ICD-10-CM | POA: Diagnosis not present

## 2021-07-16 DIAGNOSIS — E669 Obesity, unspecified: Secondary | ICD-10-CM | POA: Diagnosis not present

## 2021-07-16 DIAGNOSIS — I10 Essential (primary) hypertension: Secondary | ICD-10-CM | POA: Diagnosis not present

## 2021-07-16 DIAGNOSIS — R58 Hemorrhage, not elsewhere classified: Secondary | ICD-10-CM | POA: Diagnosis not present

## 2021-07-16 DIAGNOSIS — E876 Hypokalemia: Secondary | ICD-10-CM | POA: Diagnosis not present

## 2021-07-16 DIAGNOSIS — S81802D Unspecified open wound, left lower leg, subsequent encounter: Secondary | ICD-10-CM | POA: Diagnosis not present

## 2021-07-16 DIAGNOSIS — W1839XA Other fall on same level, initial encounter: Secondary | ICD-10-CM | POA: Diagnosis not present

## 2021-07-16 DIAGNOSIS — Z9181 History of falling: Secondary | ICD-10-CM | POA: Diagnosis not present

## 2021-07-16 DIAGNOSIS — M4807 Spinal stenosis, lumbosacral region: Secondary | ICD-10-CM | POA: Diagnosis not present

## 2021-07-16 DIAGNOSIS — Z743 Need for continuous supervision: Secondary | ICD-10-CM | POA: Diagnosis not present

## 2021-07-16 DIAGNOSIS — Z609 Problem related to social environment, unspecified: Secondary | ICD-10-CM | POA: Diagnosis not present

## 2021-07-16 DIAGNOSIS — R69 Illness, unspecified: Secondary | ICD-10-CM | POA: Diagnosis not present

## 2021-07-16 DIAGNOSIS — R296 Repeated falls: Secondary | ICD-10-CM | POA: Diagnosis not present

## 2021-07-16 DIAGNOSIS — Z23 Encounter for immunization: Secondary | ICD-10-CM | POA: Diagnosis not present

## 2021-07-16 DIAGNOSIS — W1830XA Fall on same level, unspecified, initial encounter: Secondary | ICD-10-CM | POA: Diagnosis not present

## 2021-07-16 DIAGNOSIS — S81812A Laceration without foreign body, left lower leg, initial encounter: Secondary | ICD-10-CM | POA: Diagnosis not present

## 2021-07-20 DIAGNOSIS — I10 Essential (primary) hypertension: Secondary | ICD-10-CM | POA: Diagnosis not present

## 2021-07-20 DIAGNOSIS — R499 Unspecified voice and resonance disorder: Secondary | ICD-10-CM | POA: Diagnosis not present

## 2021-07-20 DIAGNOSIS — S81802D Unspecified open wound, left lower leg, subsequent encounter: Secondary | ICD-10-CM | POA: Diagnosis not present

## 2021-07-20 DIAGNOSIS — Z299 Encounter for prophylactic measures, unspecified: Secondary | ICD-10-CM | POA: Diagnosis not present

## 2021-07-20 DIAGNOSIS — G8929 Other chronic pain: Secondary | ICD-10-CM | POA: Diagnosis not present

## 2021-07-20 DIAGNOSIS — Z48817 Encounter for surgical aftercare following surgery on the skin and subcutaneous tissue: Secondary | ICD-10-CM | POA: Diagnosis not present

## 2021-07-20 DIAGNOSIS — E1159 Type 2 diabetes mellitus with other circulatory complications: Secondary | ICD-10-CM | POA: Diagnosis not present

## 2021-07-20 DIAGNOSIS — W1830XD Fall on same level, unspecified, subsequent encounter: Secondary | ICD-10-CM | POA: Diagnosis not present

## 2021-07-20 DIAGNOSIS — R296 Repeated falls: Secondary | ICD-10-CM | POA: Diagnosis not present

## 2021-07-20 DIAGNOSIS — M4807 Spinal stenosis, lumbosacral region: Secondary | ICD-10-CM | POA: Diagnosis not present

## 2021-07-20 DIAGNOSIS — R41841 Cognitive communication deficit: Secondary | ICD-10-CM | POA: Diagnosis not present

## 2021-07-20 DIAGNOSIS — M6281 Muscle weakness (generalized): Secondary | ICD-10-CM | POA: Diagnosis not present

## 2021-07-20 DIAGNOSIS — E1165 Type 2 diabetes mellitus with hyperglycemia: Secondary | ICD-10-CM | POA: Diagnosis not present

## 2021-07-20 DIAGNOSIS — R2689 Other abnormalities of gait and mobility: Secondary | ICD-10-CM | POA: Diagnosis not present

## 2021-07-20 DIAGNOSIS — G20 Parkinson's disease: Secondary | ICD-10-CM | POA: Diagnosis not present

## 2021-07-20 DIAGNOSIS — M545 Low back pain, unspecified: Secondary | ICD-10-CM | POA: Diagnosis not present

## 2021-07-20 DIAGNOSIS — Z79899 Other long term (current) drug therapy: Secondary | ICD-10-CM | POA: Diagnosis not present

## 2021-07-20 DIAGNOSIS — I152 Hypertension secondary to endocrine disorders: Secondary | ICD-10-CM | POA: Diagnosis not present

## 2021-07-20 DIAGNOSIS — Z8673 Personal history of transient ischemic attack (TIA), and cerebral infarction without residual deficits: Secondary | ICD-10-CM | POA: Diagnosis not present

## 2021-07-20 DIAGNOSIS — R69 Illness, unspecified: Secondary | ICD-10-CM | POA: Diagnosis not present

## 2021-07-20 DIAGNOSIS — E119 Type 2 diabetes mellitus without complications: Secondary | ICD-10-CM | POA: Diagnosis not present

## 2021-07-26 DIAGNOSIS — M545 Low back pain, unspecified: Secondary | ICD-10-CM | POA: Diagnosis not present

## 2021-07-26 DIAGNOSIS — S81802D Unspecified open wound, left lower leg, subsequent encounter: Secondary | ICD-10-CM | POA: Diagnosis not present

## 2021-07-26 DIAGNOSIS — E1165 Type 2 diabetes mellitus with hyperglycemia: Secondary | ICD-10-CM | POA: Diagnosis not present

## 2021-07-26 DIAGNOSIS — Z299 Encounter for prophylactic measures, unspecified: Secondary | ICD-10-CM | POA: Diagnosis not present

## 2021-07-26 DIAGNOSIS — I10 Essential (primary) hypertension: Secondary | ICD-10-CM | POA: Diagnosis not present

## 2021-08-02 DIAGNOSIS — I10 Essential (primary) hypertension: Secondary | ICD-10-CM | POA: Diagnosis not present

## 2021-08-02 DIAGNOSIS — G8929 Other chronic pain: Secondary | ICD-10-CM | POA: Diagnosis not present

## 2021-08-02 DIAGNOSIS — E1165 Type 2 diabetes mellitus with hyperglycemia: Secondary | ICD-10-CM | POA: Diagnosis not present

## 2021-08-02 DIAGNOSIS — Z299 Encounter for prophylactic measures, unspecified: Secondary | ICD-10-CM | POA: Diagnosis not present

## 2021-08-02 DIAGNOSIS — M545 Low back pain, unspecified: Secondary | ICD-10-CM | POA: Diagnosis not present

## 2021-08-03 DIAGNOSIS — L97929 Non-pressure chronic ulcer of unspecified part of left lower leg with unspecified severity: Secondary | ICD-10-CM | POA: Diagnosis not present

## 2021-08-03 DIAGNOSIS — Z853 Personal history of malignant neoplasm of breast: Secondary | ICD-10-CM | POA: Diagnosis not present

## 2021-08-03 DIAGNOSIS — E11622 Type 2 diabetes mellitus with other skin ulcer: Secondary | ICD-10-CM | POA: Diagnosis not present

## 2021-08-03 DIAGNOSIS — E785 Hyperlipidemia, unspecified: Secondary | ICD-10-CM | POA: Diagnosis not present

## 2021-08-03 DIAGNOSIS — I890 Lymphedema, not elsewhere classified: Secondary | ICD-10-CM | POA: Diagnosis not present

## 2021-08-03 DIAGNOSIS — I10 Essential (primary) hypertension: Secondary | ICD-10-CM | POA: Diagnosis not present

## 2021-08-03 DIAGNOSIS — Z79899 Other long term (current) drug therapy: Secondary | ICD-10-CM | POA: Diagnosis not present

## 2021-08-03 DIAGNOSIS — S81802D Unspecified open wound, left lower leg, subsequent encounter: Secondary | ICD-10-CM | POA: Diagnosis not present

## 2021-08-03 DIAGNOSIS — G20 Parkinson's disease: Secondary | ICD-10-CM | POA: Diagnosis not present

## 2021-08-03 DIAGNOSIS — M549 Dorsalgia, unspecified: Secondary | ICD-10-CM | POA: Diagnosis not present

## 2021-08-04 DIAGNOSIS — S81812A Laceration without foreign body, left lower leg, initial encounter: Secondary | ICD-10-CM | POA: Diagnosis not present

## 2021-08-04 DIAGNOSIS — M545 Low back pain, unspecified: Secondary | ICD-10-CM | POA: Diagnosis not present

## 2021-08-04 DIAGNOSIS — Z8673 Personal history of transient ischemic attack (TIA), and cerebral infarction without residual deficits: Secondary | ICD-10-CM | POA: Diagnosis not present

## 2021-08-04 DIAGNOSIS — Z6829 Body mass index (BMI) 29.0-29.9, adult: Secondary | ICD-10-CM | POA: Diagnosis not present

## 2021-08-04 DIAGNOSIS — Z9181 History of falling: Secondary | ICD-10-CM | POA: Diagnosis not present

## 2021-08-04 DIAGNOSIS — I10 Essential (primary) hypertension: Secondary | ICD-10-CM | POA: Diagnosis not present

## 2021-08-04 DIAGNOSIS — F1120 Opioid dependence, uncomplicated: Secondary | ICD-10-CM | POA: Diagnosis not present

## 2021-08-04 DIAGNOSIS — Z7984 Long term (current) use of oral hypoglycemic drugs: Secondary | ICD-10-CM | POA: Diagnosis not present

## 2021-08-04 DIAGNOSIS — M4800 Spinal stenosis, site unspecified: Secondary | ICD-10-CM | POA: Diagnosis not present

## 2021-08-04 DIAGNOSIS — G8929 Other chronic pain: Secondary | ICD-10-CM | POA: Diagnosis not present

## 2021-08-04 DIAGNOSIS — G20 Parkinson's disease: Secondary | ICD-10-CM | POA: Diagnosis not present

## 2021-08-04 DIAGNOSIS — Z48817 Encounter for surgical aftercare following surgery on the skin and subcutaneous tissue: Secondary | ICD-10-CM | POA: Diagnosis not present

## 2021-08-04 DIAGNOSIS — W1830XD Fall on same level, unspecified, subsequent encounter: Secondary | ICD-10-CM | POA: Diagnosis not present

## 2021-08-04 DIAGNOSIS — E1165 Type 2 diabetes mellitus with hyperglycemia: Secondary | ICD-10-CM | POA: Diagnosis not present

## 2021-08-04 DIAGNOSIS — E785 Hyperlipidemia, unspecified: Secondary | ICD-10-CM | POA: Diagnosis not present

## 2021-08-04 DIAGNOSIS — E669 Obesity, unspecified: Secondary | ICD-10-CM | POA: Diagnosis not present

## 2021-08-04 DIAGNOSIS — I700 Atherosclerosis of aorta: Secondary | ICD-10-CM | POA: Diagnosis not present

## 2021-08-08 DIAGNOSIS — M6281 Muscle weakness (generalized): Secondary | ICD-10-CM | POA: Diagnosis not present

## 2021-08-08 DIAGNOSIS — G20 Parkinson's disease: Secondary | ICD-10-CM | POA: Diagnosis not present

## 2021-08-08 DIAGNOSIS — R2689 Other abnormalities of gait and mobility: Secondary | ICD-10-CM | POA: Diagnosis not present

## 2021-08-08 DIAGNOSIS — R296 Repeated falls: Secondary | ICD-10-CM | POA: Diagnosis not present

## 2021-08-08 DIAGNOSIS — S81802D Unspecified open wound, left lower leg, subsequent encounter: Secondary | ICD-10-CM | POA: Diagnosis not present

## 2021-08-09 DIAGNOSIS — Z9181 History of falling: Secondary | ICD-10-CM | POA: Diagnosis not present

## 2021-08-09 DIAGNOSIS — E669 Obesity, unspecified: Secondary | ICD-10-CM | POA: Diagnosis not present

## 2021-08-09 DIAGNOSIS — S81812A Laceration without foreign body, left lower leg, initial encounter: Secondary | ICD-10-CM | POA: Diagnosis not present

## 2021-08-09 DIAGNOSIS — W1830XD Fall on same level, unspecified, subsequent encounter: Secondary | ICD-10-CM | POA: Diagnosis not present

## 2021-08-09 DIAGNOSIS — Z6829 Body mass index (BMI) 29.0-29.9, adult: Secondary | ICD-10-CM | POA: Diagnosis not present

## 2021-08-09 DIAGNOSIS — Z8673 Personal history of transient ischemic attack (TIA), and cerebral infarction without residual deficits: Secondary | ICD-10-CM | POA: Diagnosis not present

## 2021-08-09 DIAGNOSIS — I700 Atherosclerosis of aorta: Secondary | ICD-10-CM | POA: Diagnosis not present

## 2021-08-09 DIAGNOSIS — G8929 Other chronic pain: Secondary | ICD-10-CM | POA: Diagnosis not present

## 2021-08-09 DIAGNOSIS — M4800 Spinal stenosis, site unspecified: Secondary | ICD-10-CM | POA: Diagnosis not present

## 2021-08-09 DIAGNOSIS — I10 Essential (primary) hypertension: Secondary | ICD-10-CM | POA: Diagnosis not present

## 2021-08-09 DIAGNOSIS — G20 Parkinson's disease: Secondary | ICD-10-CM | POA: Diagnosis not present

## 2021-08-09 DIAGNOSIS — F1120 Opioid dependence, uncomplicated: Secondary | ICD-10-CM | POA: Diagnosis not present

## 2021-08-09 DIAGNOSIS — M545 Low back pain, unspecified: Secondary | ICD-10-CM | POA: Diagnosis not present

## 2021-08-09 DIAGNOSIS — Z7984 Long term (current) use of oral hypoglycemic drugs: Secondary | ICD-10-CM | POA: Diagnosis not present

## 2021-08-09 DIAGNOSIS — E785 Hyperlipidemia, unspecified: Secondary | ICD-10-CM | POA: Diagnosis not present

## 2021-08-09 DIAGNOSIS — E1165 Type 2 diabetes mellitus with hyperglycemia: Secondary | ICD-10-CM | POA: Diagnosis not present

## 2021-08-10 DIAGNOSIS — E669 Obesity, unspecified: Secondary | ICD-10-CM | POA: Diagnosis not present

## 2021-08-10 DIAGNOSIS — Z8673 Personal history of transient ischemic attack (TIA), and cerebral infarction without residual deficits: Secondary | ICD-10-CM | POA: Diagnosis not present

## 2021-08-10 DIAGNOSIS — Z7984 Long term (current) use of oral hypoglycemic drugs: Secondary | ICD-10-CM | POA: Diagnosis not present

## 2021-08-10 DIAGNOSIS — W1830XD Fall on same level, unspecified, subsequent encounter: Secondary | ICD-10-CM | POA: Diagnosis not present

## 2021-08-10 DIAGNOSIS — I700 Atherosclerosis of aorta: Secondary | ICD-10-CM | POA: Diagnosis not present

## 2021-08-10 DIAGNOSIS — M4800 Spinal stenosis, site unspecified: Secondary | ICD-10-CM | POA: Diagnosis not present

## 2021-08-10 DIAGNOSIS — E785 Hyperlipidemia, unspecified: Secondary | ICD-10-CM | POA: Diagnosis not present

## 2021-08-10 DIAGNOSIS — E1165 Type 2 diabetes mellitus with hyperglycemia: Secondary | ICD-10-CM | POA: Diagnosis not present

## 2021-08-10 DIAGNOSIS — G20 Parkinson's disease: Secondary | ICD-10-CM | POA: Diagnosis not present

## 2021-08-10 DIAGNOSIS — Z6829 Body mass index (BMI) 29.0-29.9, adult: Secondary | ICD-10-CM | POA: Diagnosis not present

## 2021-08-10 DIAGNOSIS — F1120 Opioid dependence, uncomplicated: Secondary | ICD-10-CM | POA: Diagnosis not present

## 2021-08-10 DIAGNOSIS — S81812A Laceration without foreign body, left lower leg, initial encounter: Secondary | ICD-10-CM | POA: Diagnosis not present

## 2021-08-10 DIAGNOSIS — G8929 Other chronic pain: Secondary | ICD-10-CM | POA: Diagnosis not present

## 2021-08-10 DIAGNOSIS — M545 Low back pain, unspecified: Secondary | ICD-10-CM | POA: Diagnosis not present

## 2021-08-10 DIAGNOSIS — Z9181 History of falling: Secondary | ICD-10-CM | POA: Diagnosis not present

## 2021-08-10 DIAGNOSIS — I10 Essential (primary) hypertension: Secondary | ICD-10-CM | POA: Diagnosis not present

## 2021-08-13 DIAGNOSIS — G894 Chronic pain syndrome: Secondary | ICD-10-CM | POA: Diagnosis not present

## 2021-08-13 DIAGNOSIS — G20 Parkinson's disease: Secondary | ICD-10-CM | POA: Diagnosis not present

## 2021-08-13 DIAGNOSIS — M961 Postlaminectomy syndrome, not elsewhere classified: Secondary | ICD-10-CM | POA: Diagnosis not present

## 2021-08-13 DIAGNOSIS — R296 Repeated falls: Secondary | ICD-10-CM | POA: Diagnosis not present

## 2021-08-14 DIAGNOSIS — Z6833 Body mass index (BMI) 33.0-33.9, adult: Secondary | ICD-10-CM | POA: Diagnosis not present

## 2021-08-14 DIAGNOSIS — R69 Illness, unspecified: Secondary | ICD-10-CM | POA: Diagnosis not present

## 2021-08-14 DIAGNOSIS — Z299 Encounter for prophylactic measures, unspecified: Secondary | ICD-10-CM | POA: Diagnosis not present

## 2021-08-14 DIAGNOSIS — L97929 Non-pressure chronic ulcer of unspecified part of left lower leg with unspecified severity: Secondary | ICD-10-CM | POA: Diagnosis not present

## 2021-08-14 DIAGNOSIS — I10 Essential (primary) hypertension: Secondary | ICD-10-CM | POA: Diagnosis not present

## 2021-08-14 DIAGNOSIS — E1165 Type 2 diabetes mellitus with hyperglycemia: Secondary | ICD-10-CM | POA: Diagnosis not present

## 2021-08-14 DIAGNOSIS — G20 Parkinson's disease: Secondary | ICD-10-CM | POA: Diagnosis not present

## 2021-08-15 DIAGNOSIS — E1165 Type 2 diabetes mellitus with hyperglycemia: Secondary | ICD-10-CM | POA: Diagnosis not present

## 2021-08-15 DIAGNOSIS — W1830XD Fall on same level, unspecified, subsequent encounter: Secondary | ICD-10-CM | POA: Diagnosis not present

## 2021-08-15 DIAGNOSIS — F1120 Opioid dependence, uncomplicated: Secondary | ICD-10-CM | POA: Diagnosis not present

## 2021-08-15 DIAGNOSIS — Z8673 Personal history of transient ischemic attack (TIA), and cerebral infarction without residual deficits: Secondary | ICD-10-CM | POA: Diagnosis not present

## 2021-08-15 DIAGNOSIS — Z7984 Long term (current) use of oral hypoglycemic drugs: Secondary | ICD-10-CM | POA: Diagnosis not present

## 2021-08-15 DIAGNOSIS — Z6829 Body mass index (BMI) 29.0-29.9, adult: Secondary | ICD-10-CM | POA: Diagnosis not present

## 2021-08-15 DIAGNOSIS — Z9181 History of falling: Secondary | ICD-10-CM | POA: Diagnosis not present

## 2021-08-15 DIAGNOSIS — E669 Obesity, unspecified: Secondary | ICD-10-CM | POA: Diagnosis not present

## 2021-08-15 DIAGNOSIS — M4800 Spinal stenosis, site unspecified: Secondary | ICD-10-CM | POA: Diagnosis not present

## 2021-08-15 DIAGNOSIS — S81812A Laceration without foreign body, left lower leg, initial encounter: Secondary | ICD-10-CM | POA: Diagnosis not present

## 2021-08-15 DIAGNOSIS — E785 Hyperlipidemia, unspecified: Secondary | ICD-10-CM | POA: Diagnosis not present

## 2021-08-15 DIAGNOSIS — G8929 Other chronic pain: Secondary | ICD-10-CM | POA: Diagnosis not present

## 2021-08-15 DIAGNOSIS — I10 Essential (primary) hypertension: Secondary | ICD-10-CM | POA: Diagnosis not present

## 2021-08-15 DIAGNOSIS — G20 Parkinson's disease: Secondary | ICD-10-CM | POA: Diagnosis not present

## 2021-08-15 DIAGNOSIS — I700 Atherosclerosis of aorta: Secondary | ICD-10-CM | POA: Diagnosis not present

## 2021-08-15 DIAGNOSIS — M545 Low back pain, unspecified: Secondary | ICD-10-CM | POA: Diagnosis not present

## 2021-08-16 DIAGNOSIS — E785 Hyperlipidemia, unspecified: Secondary | ICD-10-CM | POA: Diagnosis not present

## 2021-08-16 DIAGNOSIS — G8929 Other chronic pain: Secondary | ICD-10-CM | POA: Diagnosis not present

## 2021-08-16 DIAGNOSIS — Z6829 Body mass index (BMI) 29.0-29.9, adult: Secondary | ICD-10-CM | POA: Diagnosis not present

## 2021-08-16 DIAGNOSIS — S81812A Laceration without foreign body, left lower leg, initial encounter: Secondary | ICD-10-CM | POA: Diagnosis not present

## 2021-08-16 DIAGNOSIS — F1120 Opioid dependence, uncomplicated: Secondary | ICD-10-CM | POA: Diagnosis not present

## 2021-08-16 DIAGNOSIS — M545 Low back pain, unspecified: Secondary | ICD-10-CM | POA: Diagnosis not present

## 2021-08-16 DIAGNOSIS — Z7984 Long term (current) use of oral hypoglycemic drugs: Secondary | ICD-10-CM | POA: Diagnosis not present

## 2021-08-16 DIAGNOSIS — I700 Atherosclerosis of aorta: Secondary | ICD-10-CM | POA: Diagnosis not present

## 2021-08-16 DIAGNOSIS — M4800 Spinal stenosis, site unspecified: Secondary | ICD-10-CM | POA: Diagnosis not present

## 2021-08-16 DIAGNOSIS — G20 Parkinson's disease: Secondary | ICD-10-CM | POA: Diagnosis not present

## 2021-08-16 DIAGNOSIS — Z8673 Personal history of transient ischemic attack (TIA), and cerebral infarction without residual deficits: Secondary | ICD-10-CM | POA: Diagnosis not present

## 2021-08-16 DIAGNOSIS — E1165 Type 2 diabetes mellitus with hyperglycemia: Secondary | ICD-10-CM | POA: Diagnosis not present

## 2021-08-16 DIAGNOSIS — Z9181 History of falling: Secondary | ICD-10-CM | POA: Diagnosis not present

## 2021-08-16 DIAGNOSIS — W1830XD Fall on same level, unspecified, subsequent encounter: Secondary | ICD-10-CM | POA: Diagnosis not present

## 2021-08-16 DIAGNOSIS — I10 Essential (primary) hypertension: Secondary | ICD-10-CM | POA: Diagnosis not present

## 2021-08-16 DIAGNOSIS — E669 Obesity, unspecified: Secondary | ICD-10-CM | POA: Diagnosis not present

## 2021-08-17 DIAGNOSIS — Z853 Personal history of malignant neoplasm of breast: Secondary | ICD-10-CM | POA: Diagnosis not present

## 2021-08-17 DIAGNOSIS — S81802D Unspecified open wound, left lower leg, subsequent encounter: Secondary | ICD-10-CM | POA: Diagnosis not present

## 2021-08-17 DIAGNOSIS — I10 Essential (primary) hypertension: Secondary | ICD-10-CM | POA: Diagnosis not present

## 2021-08-17 DIAGNOSIS — E11622 Type 2 diabetes mellitus with other skin ulcer: Secondary | ICD-10-CM | POA: Diagnosis not present

## 2021-08-17 DIAGNOSIS — G20 Parkinson's disease: Secondary | ICD-10-CM | POA: Diagnosis not present

## 2021-08-17 DIAGNOSIS — E785 Hyperlipidemia, unspecified: Secondary | ICD-10-CM | POA: Diagnosis not present

## 2021-08-17 DIAGNOSIS — L97929 Non-pressure chronic ulcer of unspecified part of left lower leg with unspecified severity: Secondary | ICD-10-CM | POA: Diagnosis not present

## 2021-08-17 DIAGNOSIS — Z79899 Other long term (current) drug therapy: Secondary | ICD-10-CM | POA: Diagnosis not present

## 2021-08-17 DIAGNOSIS — M549 Dorsalgia, unspecified: Secondary | ICD-10-CM | POA: Diagnosis not present

## 2021-08-17 DIAGNOSIS — I890 Lymphedema, not elsewhere classified: Secondary | ICD-10-CM | POA: Diagnosis not present

## 2021-08-20 DIAGNOSIS — E785 Hyperlipidemia, unspecified: Secondary | ICD-10-CM | POA: Diagnosis not present

## 2021-08-20 DIAGNOSIS — W1830XD Fall on same level, unspecified, subsequent encounter: Secondary | ICD-10-CM | POA: Diagnosis not present

## 2021-08-20 DIAGNOSIS — M545 Low back pain, unspecified: Secondary | ICD-10-CM | POA: Diagnosis not present

## 2021-08-20 DIAGNOSIS — G20 Parkinson's disease: Secondary | ICD-10-CM | POA: Diagnosis not present

## 2021-08-20 DIAGNOSIS — Z8673 Personal history of transient ischemic attack (TIA), and cerebral infarction without residual deficits: Secondary | ICD-10-CM | POA: Diagnosis not present

## 2021-08-20 DIAGNOSIS — M4800 Spinal stenosis, site unspecified: Secondary | ICD-10-CM | POA: Diagnosis not present

## 2021-08-20 DIAGNOSIS — Z6829 Body mass index (BMI) 29.0-29.9, adult: Secondary | ICD-10-CM | POA: Diagnosis not present

## 2021-08-20 DIAGNOSIS — S81812A Laceration without foreign body, left lower leg, initial encounter: Secondary | ICD-10-CM | POA: Diagnosis not present

## 2021-08-20 DIAGNOSIS — G8929 Other chronic pain: Secondary | ICD-10-CM | POA: Diagnosis not present

## 2021-08-20 DIAGNOSIS — E669 Obesity, unspecified: Secondary | ICD-10-CM | POA: Diagnosis not present

## 2021-08-20 DIAGNOSIS — F1120 Opioid dependence, uncomplicated: Secondary | ICD-10-CM | POA: Diagnosis not present

## 2021-08-20 DIAGNOSIS — I700 Atherosclerosis of aorta: Secondary | ICD-10-CM | POA: Diagnosis not present

## 2021-08-20 DIAGNOSIS — Z9181 History of falling: Secondary | ICD-10-CM | POA: Diagnosis not present

## 2021-08-20 DIAGNOSIS — E1165 Type 2 diabetes mellitus with hyperglycemia: Secondary | ICD-10-CM | POA: Diagnosis not present

## 2021-08-20 DIAGNOSIS — I10 Essential (primary) hypertension: Secondary | ICD-10-CM | POA: Diagnosis not present

## 2021-08-20 DIAGNOSIS — Z7984 Long term (current) use of oral hypoglycemic drugs: Secondary | ICD-10-CM | POA: Diagnosis not present

## 2021-08-21 DIAGNOSIS — I700 Atherosclerosis of aorta: Secondary | ICD-10-CM | POA: Diagnosis not present

## 2021-08-21 DIAGNOSIS — W1830XD Fall on same level, unspecified, subsequent encounter: Secondary | ICD-10-CM | POA: Diagnosis not present

## 2021-08-21 DIAGNOSIS — E785 Hyperlipidemia, unspecified: Secondary | ICD-10-CM | POA: Diagnosis not present

## 2021-08-21 DIAGNOSIS — G20 Parkinson's disease: Secondary | ICD-10-CM | POA: Diagnosis not present

## 2021-08-21 DIAGNOSIS — M545 Low back pain, unspecified: Secondary | ICD-10-CM | POA: Diagnosis not present

## 2021-08-21 DIAGNOSIS — E1165 Type 2 diabetes mellitus with hyperglycemia: Secondary | ICD-10-CM | POA: Diagnosis not present

## 2021-08-21 DIAGNOSIS — E669 Obesity, unspecified: Secondary | ICD-10-CM | POA: Diagnosis not present

## 2021-08-21 DIAGNOSIS — G8929 Other chronic pain: Secondary | ICD-10-CM | POA: Diagnosis not present

## 2021-08-21 DIAGNOSIS — M4800 Spinal stenosis, site unspecified: Secondary | ICD-10-CM | POA: Diagnosis not present

## 2021-08-21 DIAGNOSIS — Z8673 Personal history of transient ischemic attack (TIA), and cerebral infarction without residual deficits: Secondary | ICD-10-CM | POA: Diagnosis not present

## 2021-08-21 DIAGNOSIS — Z7984 Long term (current) use of oral hypoglycemic drugs: Secondary | ICD-10-CM | POA: Diagnosis not present

## 2021-08-21 DIAGNOSIS — S81812A Laceration without foreign body, left lower leg, initial encounter: Secondary | ICD-10-CM | POA: Diagnosis not present

## 2021-08-21 DIAGNOSIS — Z9181 History of falling: Secondary | ICD-10-CM | POA: Diagnosis not present

## 2021-08-21 DIAGNOSIS — F1120 Opioid dependence, uncomplicated: Secondary | ICD-10-CM | POA: Diagnosis not present

## 2021-08-21 DIAGNOSIS — Z6829 Body mass index (BMI) 29.0-29.9, adult: Secondary | ICD-10-CM | POA: Diagnosis not present

## 2021-08-21 DIAGNOSIS — I10 Essential (primary) hypertension: Secondary | ICD-10-CM | POA: Diagnosis not present

## 2021-08-24 DIAGNOSIS — Z6829 Body mass index (BMI) 29.0-29.9, adult: Secondary | ICD-10-CM | POA: Diagnosis not present

## 2021-08-24 DIAGNOSIS — M545 Low back pain, unspecified: Secondary | ICD-10-CM | POA: Diagnosis not present

## 2021-08-24 DIAGNOSIS — G20 Parkinson's disease: Secondary | ICD-10-CM | POA: Diagnosis not present

## 2021-08-24 DIAGNOSIS — I10 Essential (primary) hypertension: Secondary | ICD-10-CM | POA: Diagnosis not present

## 2021-08-24 DIAGNOSIS — G8929 Other chronic pain: Secondary | ICD-10-CM | POA: Diagnosis not present

## 2021-08-24 DIAGNOSIS — I700 Atherosclerosis of aorta: Secondary | ICD-10-CM | POA: Diagnosis not present

## 2021-08-24 DIAGNOSIS — S81812A Laceration without foreign body, left lower leg, initial encounter: Secondary | ICD-10-CM | POA: Diagnosis not present

## 2021-08-24 DIAGNOSIS — W1830XD Fall on same level, unspecified, subsequent encounter: Secondary | ICD-10-CM | POA: Diagnosis not present

## 2021-08-24 DIAGNOSIS — Z9181 History of falling: Secondary | ICD-10-CM | POA: Diagnosis not present

## 2021-08-24 DIAGNOSIS — M4800 Spinal stenosis, site unspecified: Secondary | ICD-10-CM | POA: Diagnosis not present

## 2021-08-24 DIAGNOSIS — E1165 Type 2 diabetes mellitus with hyperglycemia: Secondary | ICD-10-CM | POA: Diagnosis not present

## 2021-08-24 DIAGNOSIS — E785 Hyperlipidemia, unspecified: Secondary | ICD-10-CM | POA: Diagnosis not present

## 2021-08-24 DIAGNOSIS — Z7984 Long term (current) use of oral hypoglycemic drugs: Secondary | ICD-10-CM | POA: Diagnosis not present

## 2021-08-24 DIAGNOSIS — E669 Obesity, unspecified: Secondary | ICD-10-CM | POA: Diagnosis not present

## 2021-08-24 DIAGNOSIS — Z8673 Personal history of transient ischemic attack (TIA), and cerebral infarction without residual deficits: Secondary | ICD-10-CM | POA: Diagnosis not present

## 2021-08-24 DIAGNOSIS — F1120 Opioid dependence, uncomplicated: Secondary | ICD-10-CM | POA: Diagnosis not present

## 2021-08-27 DIAGNOSIS — Z6829 Body mass index (BMI) 29.0-29.9, adult: Secondary | ICD-10-CM | POA: Diagnosis not present

## 2021-08-27 DIAGNOSIS — Z9181 History of falling: Secondary | ICD-10-CM | POA: Diagnosis not present

## 2021-08-27 DIAGNOSIS — Z7984 Long term (current) use of oral hypoglycemic drugs: Secondary | ICD-10-CM | POA: Diagnosis not present

## 2021-08-27 DIAGNOSIS — I700 Atherosclerosis of aorta: Secondary | ICD-10-CM | POA: Diagnosis not present

## 2021-08-27 DIAGNOSIS — G20 Parkinson's disease: Secondary | ICD-10-CM | POA: Diagnosis not present

## 2021-08-27 DIAGNOSIS — I10 Essential (primary) hypertension: Secondary | ICD-10-CM | POA: Diagnosis not present

## 2021-08-27 DIAGNOSIS — M4800 Spinal stenosis, site unspecified: Secondary | ICD-10-CM | POA: Diagnosis not present

## 2021-08-27 DIAGNOSIS — W1830XD Fall on same level, unspecified, subsequent encounter: Secondary | ICD-10-CM | POA: Diagnosis not present

## 2021-08-27 DIAGNOSIS — S81812A Laceration without foreign body, left lower leg, initial encounter: Secondary | ICD-10-CM | POA: Diagnosis not present

## 2021-08-27 DIAGNOSIS — M545 Low back pain, unspecified: Secondary | ICD-10-CM | POA: Diagnosis not present

## 2021-08-27 DIAGNOSIS — F1120 Opioid dependence, uncomplicated: Secondary | ICD-10-CM | POA: Diagnosis not present

## 2021-08-27 DIAGNOSIS — E785 Hyperlipidemia, unspecified: Secondary | ICD-10-CM | POA: Diagnosis not present

## 2021-08-27 DIAGNOSIS — G8929 Other chronic pain: Secondary | ICD-10-CM | POA: Diagnosis not present

## 2021-08-27 DIAGNOSIS — E669 Obesity, unspecified: Secondary | ICD-10-CM | POA: Diagnosis not present

## 2021-08-27 DIAGNOSIS — Z8673 Personal history of transient ischemic attack (TIA), and cerebral infarction without residual deficits: Secondary | ICD-10-CM | POA: Diagnosis not present

## 2021-08-27 DIAGNOSIS — E1165 Type 2 diabetes mellitus with hyperglycemia: Secondary | ICD-10-CM | POA: Diagnosis not present

## 2021-08-28 DIAGNOSIS — M4800 Spinal stenosis, site unspecified: Secondary | ICD-10-CM | POA: Diagnosis not present

## 2021-08-28 DIAGNOSIS — Z7984 Long term (current) use of oral hypoglycemic drugs: Secondary | ICD-10-CM | POA: Diagnosis not present

## 2021-08-28 DIAGNOSIS — I10 Essential (primary) hypertension: Secondary | ICD-10-CM | POA: Diagnosis not present

## 2021-08-28 DIAGNOSIS — M545 Low back pain, unspecified: Secondary | ICD-10-CM | POA: Diagnosis not present

## 2021-08-28 DIAGNOSIS — F1120 Opioid dependence, uncomplicated: Secondary | ICD-10-CM | POA: Diagnosis not present

## 2021-08-28 DIAGNOSIS — G8929 Other chronic pain: Secondary | ICD-10-CM | POA: Diagnosis not present

## 2021-08-28 DIAGNOSIS — E785 Hyperlipidemia, unspecified: Secondary | ICD-10-CM | POA: Diagnosis not present

## 2021-08-28 DIAGNOSIS — S81812A Laceration without foreign body, left lower leg, initial encounter: Secondary | ICD-10-CM | POA: Diagnosis not present

## 2021-08-28 DIAGNOSIS — G20 Parkinson's disease: Secondary | ICD-10-CM | POA: Diagnosis not present

## 2021-08-28 DIAGNOSIS — W1830XD Fall on same level, unspecified, subsequent encounter: Secondary | ICD-10-CM | POA: Diagnosis not present

## 2021-08-28 DIAGNOSIS — I700 Atherosclerosis of aorta: Secondary | ICD-10-CM | POA: Diagnosis not present

## 2021-08-28 DIAGNOSIS — E669 Obesity, unspecified: Secondary | ICD-10-CM | POA: Diagnosis not present

## 2021-08-28 DIAGNOSIS — Z9181 History of falling: Secondary | ICD-10-CM | POA: Diagnosis not present

## 2021-08-28 DIAGNOSIS — Z8673 Personal history of transient ischemic attack (TIA), and cerebral infarction without residual deficits: Secondary | ICD-10-CM | POA: Diagnosis not present

## 2021-08-28 DIAGNOSIS — Z6829 Body mass index (BMI) 29.0-29.9, adult: Secondary | ICD-10-CM | POA: Diagnosis not present

## 2021-08-28 DIAGNOSIS — E1165 Type 2 diabetes mellitus with hyperglycemia: Secondary | ICD-10-CM | POA: Diagnosis not present

## 2021-08-29 DIAGNOSIS — E1165 Type 2 diabetes mellitus with hyperglycemia: Secondary | ICD-10-CM | POA: Diagnosis not present

## 2021-08-29 DIAGNOSIS — I10 Essential (primary) hypertension: Secondary | ICD-10-CM | POA: Diagnosis not present

## 2021-08-29 DIAGNOSIS — G20 Parkinson's disease: Secondary | ICD-10-CM | POA: Diagnosis not present

## 2021-08-29 DIAGNOSIS — S81812A Laceration without foreign body, left lower leg, initial encounter: Secondary | ICD-10-CM | POA: Diagnosis not present

## 2021-08-30 DIAGNOSIS — M4800 Spinal stenosis, site unspecified: Secondary | ICD-10-CM | POA: Diagnosis not present

## 2021-08-30 DIAGNOSIS — Z9181 History of falling: Secondary | ICD-10-CM | POA: Diagnosis not present

## 2021-08-30 DIAGNOSIS — I700 Atherosclerosis of aorta: Secondary | ICD-10-CM | POA: Diagnosis not present

## 2021-08-30 DIAGNOSIS — F1120 Opioid dependence, uncomplicated: Secondary | ICD-10-CM | POA: Diagnosis not present

## 2021-08-30 DIAGNOSIS — E669 Obesity, unspecified: Secondary | ICD-10-CM | POA: Diagnosis not present

## 2021-08-30 DIAGNOSIS — G20 Parkinson's disease: Secondary | ICD-10-CM | POA: Diagnosis not present

## 2021-08-30 DIAGNOSIS — E785 Hyperlipidemia, unspecified: Secondary | ICD-10-CM | POA: Diagnosis not present

## 2021-08-30 DIAGNOSIS — Z8673 Personal history of transient ischemic attack (TIA), and cerebral infarction without residual deficits: Secondary | ICD-10-CM | POA: Diagnosis not present

## 2021-08-30 DIAGNOSIS — Z7984 Long term (current) use of oral hypoglycemic drugs: Secondary | ICD-10-CM | POA: Diagnosis not present

## 2021-08-30 DIAGNOSIS — E1165 Type 2 diabetes mellitus with hyperglycemia: Secondary | ICD-10-CM | POA: Diagnosis not present

## 2021-08-30 DIAGNOSIS — Z6829 Body mass index (BMI) 29.0-29.9, adult: Secondary | ICD-10-CM | POA: Diagnosis not present

## 2021-08-30 DIAGNOSIS — I10 Essential (primary) hypertension: Secondary | ICD-10-CM | POA: Diagnosis not present

## 2021-08-30 DIAGNOSIS — S81812A Laceration without foreign body, left lower leg, initial encounter: Secondary | ICD-10-CM | POA: Diagnosis not present

## 2021-08-30 DIAGNOSIS — W1830XD Fall on same level, unspecified, subsequent encounter: Secondary | ICD-10-CM | POA: Diagnosis not present

## 2021-08-30 DIAGNOSIS — G8929 Other chronic pain: Secondary | ICD-10-CM | POA: Diagnosis not present

## 2021-08-30 DIAGNOSIS — M545 Low back pain, unspecified: Secondary | ICD-10-CM | POA: Diagnosis not present

## 2021-09-03 DIAGNOSIS — S81812A Laceration without foreign body, left lower leg, initial encounter: Secondary | ICD-10-CM | POA: Diagnosis not present

## 2021-09-03 DIAGNOSIS — I10 Essential (primary) hypertension: Secondary | ICD-10-CM | POA: Diagnosis not present

## 2021-09-03 DIAGNOSIS — M4800 Spinal stenosis, site unspecified: Secondary | ICD-10-CM | POA: Diagnosis not present

## 2021-09-03 DIAGNOSIS — Z7984 Long term (current) use of oral hypoglycemic drugs: Secondary | ICD-10-CM | POA: Diagnosis not present

## 2021-09-03 DIAGNOSIS — E669 Obesity, unspecified: Secondary | ICD-10-CM | POA: Diagnosis not present

## 2021-09-03 DIAGNOSIS — W1830XD Fall on same level, unspecified, subsequent encounter: Secondary | ICD-10-CM | POA: Diagnosis not present

## 2021-09-03 DIAGNOSIS — M545 Low back pain, unspecified: Secondary | ICD-10-CM | POA: Diagnosis not present

## 2021-09-03 DIAGNOSIS — F1120 Opioid dependence, uncomplicated: Secondary | ICD-10-CM | POA: Diagnosis not present

## 2021-09-03 DIAGNOSIS — E1165 Type 2 diabetes mellitus with hyperglycemia: Secondary | ICD-10-CM | POA: Diagnosis not present

## 2021-09-03 DIAGNOSIS — Z8673 Personal history of transient ischemic attack (TIA), and cerebral infarction without residual deficits: Secondary | ICD-10-CM | POA: Diagnosis not present

## 2021-09-03 DIAGNOSIS — G8929 Other chronic pain: Secondary | ICD-10-CM | POA: Diagnosis not present

## 2021-09-03 DIAGNOSIS — Z6829 Body mass index (BMI) 29.0-29.9, adult: Secondary | ICD-10-CM | POA: Diagnosis not present

## 2021-09-03 DIAGNOSIS — I700 Atherosclerosis of aorta: Secondary | ICD-10-CM | POA: Diagnosis not present

## 2021-09-03 DIAGNOSIS — Z9181 History of falling: Secondary | ICD-10-CM | POA: Diagnosis not present

## 2021-09-03 DIAGNOSIS — G20 Parkinson's disease: Secondary | ICD-10-CM | POA: Diagnosis not present

## 2021-09-03 DIAGNOSIS — E785 Hyperlipidemia, unspecified: Secondary | ICD-10-CM | POA: Diagnosis not present

## 2021-09-06 DIAGNOSIS — I10 Essential (primary) hypertension: Secondary | ICD-10-CM | POA: Diagnosis not present

## 2021-09-06 DIAGNOSIS — G20 Parkinson's disease: Secondary | ICD-10-CM | POA: Diagnosis not present

## 2021-09-06 DIAGNOSIS — M4800 Spinal stenosis, site unspecified: Secondary | ICD-10-CM | POA: Diagnosis not present

## 2021-09-06 DIAGNOSIS — S81812A Laceration without foreign body, left lower leg, initial encounter: Secondary | ICD-10-CM | POA: Diagnosis not present

## 2021-09-06 DIAGNOSIS — Z8673 Personal history of transient ischemic attack (TIA), and cerebral infarction without residual deficits: Secondary | ICD-10-CM | POA: Diagnosis not present

## 2021-09-06 DIAGNOSIS — E1165 Type 2 diabetes mellitus with hyperglycemia: Secondary | ICD-10-CM | POA: Diagnosis not present

## 2021-09-06 DIAGNOSIS — M545 Low back pain, unspecified: Secondary | ICD-10-CM | POA: Diagnosis not present

## 2021-09-06 DIAGNOSIS — G8929 Other chronic pain: Secondary | ICD-10-CM | POA: Diagnosis not present

## 2021-09-06 DIAGNOSIS — I700 Atherosclerosis of aorta: Secondary | ICD-10-CM | POA: Diagnosis not present

## 2021-09-06 DIAGNOSIS — E669 Obesity, unspecified: Secondary | ICD-10-CM | POA: Diagnosis not present

## 2021-09-06 DIAGNOSIS — Z9181 History of falling: Secondary | ICD-10-CM | POA: Diagnosis not present

## 2021-09-06 DIAGNOSIS — Z7984 Long term (current) use of oral hypoglycemic drugs: Secondary | ICD-10-CM | POA: Diagnosis not present

## 2021-09-06 DIAGNOSIS — F1120 Opioid dependence, uncomplicated: Secondary | ICD-10-CM | POA: Diagnosis not present

## 2021-09-06 DIAGNOSIS — W1830XD Fall on same level, unspecified, subsequent encounter: Secondary | ICD-10-CM | POA: Diagnosis not present

## 2021-09-06 DIAGNOSIS — Z6829 Body mass index (BMI) 29.0-29.9, adult: Secondary | ICD-10-CM | POA: Diagnosis not present

## 2021-09-06 DIAGNOSIS — E785 Hyperlipidemia, unspecified: Secondary | ICD-10-CM | POA: Diagnosis not present

## 2021-09-08 DIAGNOSIS — G20 Parkinson's disease: Secondary | ICD-10-CM | POA: Diagnosis not present

## 2021-09-08 DIAGNOSIS — R296 Repeated falls: Secondary | ICD-10-CM | POA: Diagnosis not present

## 2021-09-08 DIAGNOSIS — S81802D Unspecified open wound, left lower leg, subsequent encounter: Secondary | ICD-10-CM | POA: Diagnosis not present

## 2021-09-08 DIAGNOSIS — M6281 Muscle weakness (generalized): Secondary | ICD-10-CM | POA: Diagnosis not present

## 2021-09-08 DIAGNOSIS — R2689 Other abnormalities of gait and mobility: Secondary | ICD-10-CM | POA: Diagnosis not present

## 2021-09-13 DIAGNOSIS — M545 Low back pain, unspecified: Secondary | ICD-10-CM | POA: Diagnosis not present

## 2021-09-13 DIAGNOSIS — Z8673 Personal history of transient ischemic attack (TIA), and cerebral infarction without residual deficits: Secondary | ICD-10-CM | POA: Diagnosis not present

## 2021-09-13 DIAGNOSIS — E785 Hyperlipidemia, unspecified: Secondary | ICD-10-CM | POA: Diagnosis not present

## 2021-09-13 DIAGNOSIS — Z6829 Body mass index (BMI) 29.0-29.9, adult: Secondary | ICD-10-CM | POA: Diagnosis not present

## 2021-09-13 DIAGNOSIS — E1165 Type 2 diabetes mellitus with hyperglycemia: Secondary | ICD-10-CM | POA: Diagnosis not present

## 2021-09-13 DIAGNOSIS — S81812A Laceration without foreign body, left lower leg, initial encounter: Secondary | ICD-10-CM | POA: Diagnosis not present

## 2021-09-13 DIAGNOSIS — I10 Essential (primary) hypertension: Secondary | ICD-10-CM | POA: Diagnosis not present

## 2021-09-13 DIAGNOSIS — M4800 Spinal stenosis, site unspecified: Secondary | ICD-10-CM | POA: Diagnosis not present

## 2021-09-13 DIAGNOSIS — I700 Atherosclerosis of aorta: Secondary | ICD-10-CM | POA: Diagnosis not present

## 2021-09-13 DIAGNOSIS — Z7984 Long term (current) use of oral hypoglycemic drugs: Secondary | ICD-10-CM | POA: Diagnosis not present

## 2021-09-13 DIAGNOSIS — G8929 Other chronic pain: Secondary | ICD-10-CM | POA: Diagnosis not present

## 2021-09-13 DIAGNOSIS — Z9181 History of falling: Secondary | ICD-10-CM | POA: Diagnosis not present

## 2021-09-13 DIAGNOSIS — G20 Parkinson's disease: Secondary | ICD-10-CM | POA: Diagnosis not present

## 2021-09-13 DIAGNOSIS — W1830XD Fall on same level, unspecified, subsequent encounter: Secondary | ICD-10-CM | POA: Diagnosis not present

## 2021-09-13 DIAGNOSIS — E669 Obesity, unspecified: Secondary | ICD-10-CM | POA: Diagnosis not present

## 2021-09-13 DIAGNOSIS — F1120 Opioid dependence, uncomplicated: Secondary | ICD-10-CM | POA: Diagnosis not present

## 2021-09-14 DIAGNOSIS — Z7984 Long term (current) use of oral hypoglycemic drugs: Secondary | ICD-10-CM | POA: Diagnosis not present

## 2021-09-14 DIAGNOSIS — M4800 Spinal stenosis, site unspecified: Secondary | ICD-10-CM | POA: Diagnosis not present

## 2021-09-14 DIAGNOSIS — S81812A Laceration without foreign body, left lower leg, initial encounter: Secondary | ICD-10-CM | POA: Diagnosis not present

## 2021-09-14 DIAGNOSIS — E1165 Type 2 diabetes mellitus with hyperglycemia: Secondary | ICD-10-CM | POA: Diagnosis not present

## 2021-09-14 DIAGNOSIS — M545 Low back pain, unspecified: Secondary | ICD-10-CM | POA: Diagnosis not present

## 2021-09-14 DIAGNOSIS — E669 Obesity, unspecified: Secondary | ICD-10-CM | POA: Diagnosis not present

## 2021-09-14 DIAGNOSIS — E785 Hyperlipidemia, unspecified: Secondary | ICD-10-CM | POA: Diagnosis not present

## 2021-09-14 DIAGNOSIS — I700 Atherosclerosis of aorta: Secondary | ICD-10-CM | POA: Diagnosis not present

## 2021-09-14 DIAGNOSIS — Z9181 History of falling: Secondary | ICD-10-CM | POA: Diagnosis not present

## 2021-09-14 DIAGNOSIS — Z8673 Personal history of transient ischemic attack (TIA), and cerebral infarction without residual deficits: Secondary | ICD-10-CM | POA: Diagnosis not present

## 2021-09-14 DIAGNOSIS — G20 Parkinson's disease: Secondary | ICD-10-CM | POA: Diagnosis not present

## 2021-09-14 DIAGNOSIS — W1830XD Fall on same level, unspecified, subsequent encounter: Secondary | ICD-10-CM | POA: Diagnosis not present

## 2021-09-14 DIAGNOSIS — Z6829 Body mass index (BMI) 29.0-29.9, adult: Secondary | ICD-10-CM | POA: Diagnosis not present

## 2021-09-14 DIAGNOSIS — G8929 Other chronic pain: Secondary | ICD-10-CM | POA: Diagnosis not present

## 2021-09-14 DIAGNOSIS — F1120 Opioid dependence, uncomplicated: Secondary | ICD-10-CM | POA: Diagnosis not present

## 2021-09-14 DIAGNOSIS — I10 Essential (primary) hypertension: Secondary | ICD-10-CM | POA: Diagnosis not present

## 2021-09-17 DIAGNOSIS — W1830XD Fall on same level, unspecified, subsequent encounter: Secondary | ICD-10-CM | POA: Diagnosis not present

## 2021-09-17 DIAGNOSIS — I10 Essential (primary) hypertension: Secondary | ICD-10-CM | POA: Diagnosis not present

## 2021-09-17 DIAGNOSIS — S81812A Laceration without foreign body, left lower leg, initial encounter: Secondary | ICD-10-CM | POA: Diagnosis not present

## 2021-09-17 DIAGNOSIS — Z7984 Long term (current) use of oral hypoglycemic drugs: Secondary | ICD-10-CM | POA: Diagnosis not present

## 2021-09-17 DIAGNOSIS — G8929 Other chronic pain: Secondary | ICD-10-CM | POA: Diagnosis not present

## 2021-09-17 DIAGNOSIS — E785 Hyperlipidemia, unspecified: Secondary | ICD-10-CM | POA: Diagnosis not present

## 2021-09-17 DIAGNOSIS — G20 Parkinson's disease: Secondary | ICD-10-CM | POA: Diagnosis not present

## 2021-09-17 DIAGNOSIS — E1165 Type 2 diabetes mellitus with hyperglycemia: Secondary | ICD-10-CM | POA: Diagnosis not present

## 2021-09-17 DIAGNOSIS — E669 Obesity, unspecified: Secondary | ICD-10-CM | POA: Diagnosis not present

## 2021-09-17 DIAGNOSIS — Z8673 Personal history of transient ischemic attack (TIA), and cerebral infarction without residual deficits: Secondary | ICD-10-CM | POA: Diagnosis not present

## 2021-09-17 DIAGNOSIS — M4800 Spinal stenosis, site unspecified: Secondary | ICD-10-CM | POA: Diagnosis not present

## 2021-09-17 DIAGNOSIS — Z9181 History of falling: Secondary | ICD-10-CM | POA: Diagnosis not present

## 2021-09-17 DIAGNOSIS — Z6829 Body mass index (BMI) 29.0-29.9, adult: Secondary | ICD-10-CM | POA: Diagnosis not present

## 2021-09-17 DIAGNOSIS — F1120 Opioid dependence, uncomplicated: Secondary | ICD-10-CM | POA: Diagnosis not present

## 2021-09-17 DIAGNOSIS — M545 Low back pain, unspecified: Secondary | ICD-10-CM | POA: Diagnosis not present

## 2021-09-17 DIAGNOSIS — I700 Atherosclerosis of aorta: Secondary | ICD-10-CM | POA: Diagnosis not present

## 2021-09-20 DIAGNOSIS — G8929 Other chronic pain: Secondary | ICD-10-CM | POA: Diagnosis not present

## 2021-09-20 DIAGNOSIS — Z8673 Personal history of transient ischemic attack (TIA), and cerebral infarction without residual deficits: Secondary | ICD-10-CM | POA: Diagnosis not present

## 2021-09-20 DIAGNOSIS — Z9181 History of falling: Secondary | ICD-10-CM | POA: Diagnosis not present

## 2021-09-20 DIAGNOSIS — Z7984 Long term (current) use of oral hypoglycemic drugs: Secondary | ICD-10-CM | POA: Diagnosis not present

## 2021-09-20 DIAGNOSIS — G20 Parkinson's disease: Secondary | ICD-10-CM | POA: Diagnosis not present

## 2021-09-20 DIAGNOSIS — Z6829 Body mass index (BMI) 29.0-29.9, adult: Secondary | ICD-10-CM | POA: Diagnosis not present

## 2021-09-20 DIAGNOSIS — F1120 Opioid dependence, uncomplicated: Secondary | ICD-10-CM | POA: Diagnosis not present

## 2021-09-20 DIAGNOSIS — W1830XD Fall on same level, unspecified, subsequent encounter: Secondary | ICD-10-CM | POA: Diagnosis not present

## 2021-09-20 DIAGNOSIS — I10 Essential (primary) hypertension: Secondary | ICD-10-CM | POA: Diagnosis not present

## 2021-09-20 DIAGNOSIS — I700 Atherosclerosis of aorta: Secondary | ICD-10-CM | POA: Diagnosis not present

## 2021-09-20 DIAGNOSIS — M545 Low back pain, unspecified: Secondary | ICD-10-CM | POA: Diagnosis not present

## 2021-09-20 DIAGNOSIS — E785 Hyperlipidemia, unspecified: Secondary | ICD-10-CM | POA: Diagnosis not present

## 2021-09-20 DIAGNOSIS — E669 Obesity, unspecified: Secondary | ICD-10-CM | POA: Diagnosis not present

## 2021-09-20 DIAGNOSIS — M4800 Spinal stenosis, site unspecified: Secondary | ICD-10-CM | POA: Diagnosis not present

## 2021-09-20 DIAGNOSIS — S81812A Laceration without foreign body, left lower leg, initial encounter: Secondary | ICD-10-CM | POA: Diagnosis not present

## 2021-09-20 DIAGNOSIS — E1165 Type 2 diabetes mellitus with hyperglycemia: Secondary | ICD-10-CM | POA: Diagnosis not present

## 2021-09-25 DIAGNOSIS — I10 Essential (primary) hypertension: Secondary | ICD-10-CM | POA: Diagnosis not present

## 2021-09-25 DIAGNOSIS — D692 Other nonthrombocytopenic purpura: Secondary | ICD-10-CM | POA: Diagnosis not present

## 2021-09-25 DIAGNOSIS — I152 Hypertension secondary to endocrine disorders: Secondary | ICD-10-CM | POA: Diagnosis not present

## 2021-09-25 DIAGNOSIS — Z6832 Body mass index (BMI) 32.0-32.9, adult: Secondary | ICD-10-CM | POA: Diagnosis not present

## 2021-09-25 DIAGNOSIS — E1159 Type 2 diabetes mellitus with other circulatory complications: Secondary | ICD-10-CM | POA: Diagnosis not present

## 2021-09-25 DIAGNOSIS — Z299 Encounter for prophylactic measures, unspecified: Secondary | ICD-10-CM | POA: Diagnosis not present

## 2021-09-28 DIAGNOSIS — Z79899 Other long term (current) drug therapy: Secondary | ICD-10-CM | POA: Diagnosis not present

## 2021-09-28 DIAGNOSIS — E785 Hyperlipidemia, unspecified: Secondary | ICD-10-CM | POA: Diagnosis not present

## 2021-09-28 DIAGNOSIS — Z7901 Long term (current) use of anticoagulants: Secondary | ICD-10-CM | POA: Diagnosis not present

## 2021-09-28 DIAGNOSIS — I10 Essential (primary) hypertension: Secondary | ICD-10-CM | POA: Diagnosis not present

## 2021-09-28 DIAGNOSIS — M549 Dorsalgia, unspecified: Secondary | ICD-10-CM | POA: Diagnosis not present

## 2021-09-28 DIAGNOSIS — L97929 Non-pressure chronic ulcer of unspecified part of left lower leg with unspecified severity: Secondary | ICD-10-CM | POA: Diagnosis not present

## 2021-09-28 DIAGNOSIS — E11622 Type 2 diabetes mellitus with other skin ulcer: Secondary | ICD-10-CM | POA: Diagnosis not present

## 2021-09-28 DIAGNOSIS — S81802D Unspecified open wound, left lower leg, subsequent encounter: Secondary | ICD-10-CM | POA: Diagnosis not present

## 2021-10-01 DIAGNOSIS — Z9889 Other specified postprocedural states: Secondary | ICD-10-CM | POA: Diagnosis not present

## 2021-10-01 DIAGNOSIS — M4800 Spinal stenosis, site unspecified: Secondary | ICD-10-CM | POA: Diagnosis not present

## 2021-10-01 DIAGNOSIS — Z8673 Personal history of transient ischemic attack (TIA), and cerebral infarction without residual deficits: Secondary | ICD-10-CM | POA: Diagnosis not present

## 2021-10-01 DIAGNOSIS — G8929 Other chronic pain: Secondary | ICD-10-CM | POA: Diagnosis not present

## 2021-10-01 DIAGNOSIS — F1120 Opioid dependence, uncomplicated: Secondary | ICD-10-CM | POA: Diagnosis not present

## 2021-10-01 DIAGNOSIS — W1830XD Fall on same level, unspecified, subsequent encounter: Secondary | ICD-10-CM | POA: Diagnosis not present

## 2021-10-01 DIAGNOSIS — N179 Acute kidney failure, unspecified: Secondary | ICD-10-CM | POA: Diagnosis not present

## 2021-10-01 DIAGNOSIS — T148XXA Other injury of unspecified body region, initial encounter: Secondary | ICD-10-CM | POA: Diagnosis not present

## 2021-10-01 DIAGNOSIS — I700 Atherosclerosis of aorta: Secondary | ICD-10-CM | POA: Diagnosis not present

## 2021-10-01 DIAGNOSIS — W01198A Fall on same level from slipping, tripping and stumbling with subsequent striking against other object, initial encounter: Secondary | ICD-10-CM | POA: Diagnosis not present

## 2021-10-01 DIAGNOSIS — E1165 Type 2 diabetes mellitus with hyperglycemia: Secondary | ICD-10-CM | POA: Diagnosis not present

## 2021-10-01 DIAGNOSIS — M545 Low back pain, unspecified: Secondary | ICD-10-CM | POA: Diagnosis not present

## 2021-10-01 DIAGNOSIS — S72002A Fracture of unspecified part of neck of left femur, initial encounter for closed fracture: Secondary | ICD-10-CM | POA: Diagnosis not present

## 2021-10-01 DIAGNOSIS — S81812A Laceration without foreign body, left lower leg, initial encounter: Secondary | ICD-10-CM | POA: Diagnosis not present

## 2021-10-01 DIAGNOSIS — R5381 Other malaise: Secondary | ICD-10-CM | POA: Diagnosis not present

## 2021-10-01 DIAGNOSIS — Z9181 History of falling: Secondary | ICD-10-CM | POA: Diagnosis not present

## 2021-10-01 DIAGNOSIS — Z6829 Body mass index (BMI) 29.0-29.9, adult: Secondary | ICD-10-CM | POA: Diagnosis not present

## 2021-10-01 DIAGNOSIS — W19XXXA Unspecified fall, initial encounter: Secondary | ICD-10-CM | POA: Diagnosis not present

## 2021-10-01 DIAGNOSIS — Z7984 Long term (current) use of oral hypoglycemic drugs: Secondary | ICD-10-CM | POA: Diagnosis not present

## 2021-10-01 DIAGNOSIS — E669 Obesity, unspecified: Secondary | ICD-10-CM | POA: Diagnosis not present

## 2021-10-01 DIAGNOSIS — G20 Parkinson's disease: Secondary | ICD-10-CM | POA: Diagnosis not present

## 2021-10-01 DIAGNOSIS — Z743 Need for continuous supervision: Secondary | ICD-10-CM | POA: Diagnosis not present

## 2021-10-01 DIAGNOSIS — E785 Hyperlipidemia, unspecified: Secondary | ICD-10-CM | POA: Diagnosis not present

## 2021-10-01 DIAGNOSIS — I10 Essential (primary) hypertension: Secondary | ICD-10-CM | POA: Diagnosis not present

## 2021-10-02 DIAGNOSIS — Z9049 Acquired absence of other specified parts of digestive tract: Secondary | ICD-10-CM | POA: Diagnosis not present

## 2021-10-02 DIAGNOSIS — S72042A Displaced fracture of base of neck of left femur, initial encounter for closed fracture: Secondary | ICD-10-CM | POA: Diagnosis not present

## 2021-10-02 DIAGNOSIS — S72002A Fracture of unspecified part of neck of left femur, initial encounter for closed fracture: Secondary | ICD-10-CM | POA: Diagnosis not present

## 2021-10-02 DIAGNOSIS — Z7984 Long term (current) use of oral hypoglycemic drugs: Secondary | ICD-10-CM | POA: Diagnosis not present

## 2021-10-02 DIAGNOSIS — E669 Obesity, unspecified: Secondary | ICD-10-CM | POA: Diagnosis not present

## 2021-10-02 DIAGNOSIS — R69 Illness, unspecified: Secondary | ICD-10-CM | POA: Diagnosis not present

## 2021-10-02 DIAGNOSIS — R011 Cardiac murmur, unspecified: Secondary | ICD-10-CM | POA: Diagnosis not present

## 2021-10-02 DIAGNOSIS — N179 Acute kidney failure, unspecified: Secondary | ICD-10-CM | POA: Diagnosis not present

## 2021-10-02 DIAGNOSIS — Z79899 Other long term (current) drug therapy: Secondary | ICD-10-CM | POA: Diagnosis not present

## 2021-10-02 DIAGNOSIS — I10 Essential (primary) hypertension: Secondary | ICD-10-CM | POA: Diagnosis not present

## 2021-10-02 DIAGNOSIS — E1142 Type 2 diabetes mellitus with diabetic polyneuropathy: Secondary | ICD-10-CM | POA: Diagnosis not present

## 2021-10-02 DIAGNOSIS — W01198A Fall on same level from slipping, tripping and stumbling with subsequent striking against other object, initial encounter: Secondary | ICD-10-CM | POA: Diagnosis not present

## 2021-10-02 DIAGNOSIS — W19XXXA Unspecified fall, initial encounter: Secondary | ICD-10-CM | POA: Diagnosis not present

## 2021-10-02 DIAGNOSIS — Z9221 Personal history of antineoplastic chemotherapy: Secondary | ICD-10-CM | POA: Diagnosis not present

## 2021-10-02 DIAGNOSIS — Z6832 Body mass index (BMI) 32.0-32.9, adult: Secondary | ICD-10-CM | POA: Diagnosis not present

## 2021-10-02 DIAGNOSIS — G8929 Other chronic pain: Secondary | ICD-10-CM | POA: Diagnosis not present

## 2021-10-02 DIAGNOSIS — Z9889 Other specified postprocedural states: Secondary | ICD-10-CM | POA: Diagnosis not present

## 2021-10-02 DIAGNOSIS — Z96642 Presence of left artificial hip joint: Secondary | ICD-10-CM | POA: Diagnosis not present

## 2021-10-02 DIAGNOSIS — Z981 Arthrodesis status: Secondary | ICD-10-CM | POA: Diagnosis not present

## 2021-10-02 DIAGNOSIS — Z471 Aftercare following joint replacement surgery: Secondary | ICD-10-CM | POA: Diagnosis not present

## 2021-10-02 DIAGNOSIS — I350 Nonrheumatic aortic (valve) stenosis: Secondary | ICD-10-CM | POA: Diagnosis not present

## 2021-10-02 DIAGNOSIS — S72052A Unspecified fracture of head of left femur, initial encounter for closed fracture: Secondary | ICD-10-CM | POA: Diagnosis not present

## 2021-10-02 DIAGNOSIS — Z87891 Personal history of nicotine dependence: Secondary | ICD-10-CM | POA: Diagnosis not present

## 2021-10-02 DIAGNOSIS — E119 Type 2 diabetes mellitus without complications: Secondary | ICD-10-CM | POA: Diagnosis not present

## 2021-10-02 DIAGNOSIS — E11622 Type 2 diabetes mellitus with other skin ulcer: Secondary | ICD-10-CM | POA: Diagnosis not present

## 2021-10-02 DIAGNOSIS — Z853 Personal history of malignant neoplasm of breast: Secondary | ICD-10-CM | POA: Diagnosis not present

## 2021-10-02 DIAGNOSIS — E785 Hyperlipidemia, unspecified: Secondary | ICD-10-CM | POA: Diagnosis not present

## 2021-10-02 DIAGNOSIS — S72002D Fracture of unspecified part of neck of left femur, subsequent encounter for closed fracture with routine healing: Secondary | ICD-10-CM | POA: Diagnosis not present

## 2021-10-02 DIAGNOSIS — G20 Parkinson's disease: Secondary | ICD-10-CM | POA: Diagnosis not present

## 2021-10-02 DIAGNOSIS — Z9012 Acquired absence of left breast and nipple: Secondary | ICD-10-CM | POA: Diagnosis not present

## 2021-10-02 DIAGNOSIS — W19XXXD Unspecified fall, subsequent encounter: Secondary | ICD-10-CM | POA: Diagnosis not present

## 2021-10-02 DIAGNOSIS — Z66 Do not resuscitate: Secondary | ICD-10-CM | POA: Diagnosis not present

## 2021-10-05 DIAGNOSIS — E785 Hyperlipidemia, unspecified: Secondary | ICD-10-CM | POA: Diagnosis not present

## 2021-10-05 DIAGNOSIS — Z7401 Bed confinement status: Secondary | ICD-10-CM | POA: Diagnosis not present

## 2021-10-05 DIAGNOSIS — Z7984 Long term (current) use of oral hypoglycemic drugs: Secondary | ICD-10-CM | POA: Diagnosis not present

## 2021-10-05 DIAGNOSIS — R69 Illness, unspecified: Secondary | ICD-10-CM | POA: Diagnosis not present

## 2021-10-05 DIAGNOSIS — R499 Unspecified voice and resonance disorder: Secondary | ICD-10-CM | POA: Diagnosis not present

## 2021-10-05 DIAGNOSIS — R296 Repeated falls: Secondary | ICD-10-CM | POA: Diagnosis not present

## 2021-10-05 DIAGNOSIS — L97929 Non-pressure chronic ulcer of unspecified part of left lower leg with unspecified severity: Secondary | ICD-10-CM | POA: Diagnosis not present

## 2021-10-05 DIAGNOSIS — R279 Unspecified lack of coordination: Secondary | ICD-10-CM | POA: Diagnosis not present

## 2021-10-05 DIAGNOSIS — Z299 Encounter for prophylactic measures, unspecified: Secondary | ICD-10-CM | POA: Diagnosis not present

## 2021-10-05 DIAGNOSIS — R41841 Cognitive communication deficit: Secondary | ICD-10-CM | POA: Diagnosis not present

## 2021-10-05 DIAGNOSIS — Z79899 Other long term (current) drug therapy: Secondary | ICD-10-CM | POA: Diagnosis not present

## 2021-10-05 DIAGNOSIS — E11622 Type 2 diabetes mellitus with other skin ulcer: Secondary | ICD-10-CM | POA: Diagnosis not present

## 2021-10-05 DIAGNOSIS — E119 Type 2 diabetes mellitus without complications: Secondary | ICD-10-CM | POA: Diagnosis not present

## 2021-10-05 DIAGNOSIS — E1159 Type 2 diabetes mellitus with other circulatory complications: Secondary | ICD-10-CM | POA: Diagnosis not present

## 2021-10-05 DIAGNOSIS — M25552 Pain in left hip: Secondary | ICD-10-CM | POA: Diagnosis not present

## 2021-10-05 DIAGNOSIS — M549 Dorsalgia, unspecified: Secondary | ICD-10-CM | POA: Diagnosis not present

## 2021-10-05 DIAGNOSIS — W19XXXD Unspecified fall, subsequent encounter: Secondary | ICD-10-CM | POA: Diagnosis not present

## 2021-10-05 DIAGNOSIS — M6281 Muscle weakness (generalized): Secondary | ICD-10-CM | POA: Diagnosis not present

## 2021-10-05 DIAGNOSIS — S79929A Unspecified injury of unspecified thigh, initial encounter: Secondary | ICD-10-CM | POA: Diagnosis not present

## 2021-10-05 DIAGNOSIS — I519 Heart disease, unspecified: Secondary | ICD-10-CM | POA: Diagnosis not present

## 2021-10-05 DIAGNOSIS — Z7901 Long term (current) use of anticoagulants: Secondary | ICD-10-CM | POA: Diagnosis not present

## 2021-10-05 DIAGNOSIS — I350 Nonrheumatic aortic (valve) stenosis: Secondary | ICD-10-CM | POA: Diagnosis not present

## 2021-10-05 DIAGNOSIS — R011 Cardiac murmur, unspecified: Secondary | ICD-10-CM | POA: Diagnosis not present

## 2021-10-05 DIAGNOSIS — S72009A Fracture of unspecified part of neck of unspecified femur, initial encounter for closed fracture: Secondary | ICD-10-CM | POA: Diagnosis not present

## 2021-10-05 DIAGNOSIS — R2689 Other abnormalities of gait and mobility: Secondary | ICD-10-CM | POA: Diagnosis not present

## 2021-10-05 DIAGNOSIS — E440 Moderate protein-calorie malnutrition: Secondary | ICD-10-CM | POA: Diagnosis not present

## 2021-10-05 DIAGNOSIS — I152 Hypertension secondary to endocrine disorders: Secondary | ICD-10-CM | POA: Diagnosis not present

## 2021-10-05 DIAGNOSIS — Z4789 Encounter for other orthopedic aftercare: Secondary | ICD-10-CM | POA: Diagnosis not present

## 2021-10-05 DIAGNOSIS — X58XXXD Exposure to other specified factors, subsequent encounter: Secondary | ICD-10-CM | POA: Diagnosis not present

## 2021-10-05 DIAGNOSIS — S72002D Fracture of unspecified part of neck of left femur, subsequent encounter for closed fracture with routine healing: Secondary | ICD-10-CM | POA: Diagnosis not present

## 2021-10-05 DIAGNOSIS — Z743 Need for continuous supervision: Secondary | ICD-10-CM | POA: Diagnosis not present

## 2021-10-05 DIAGNOSIS — Z853 Personal history of malignant neoplasm of breast: Secondary | ICD-10-CM | POA: Diagnosis not present

## 2021-10-05 DIAGNOSIS — Z9012 Acquired absence of left breast and nipple: Secondary | ICD-10-CM | POA: Diagnosis not present

## 2021-10-05 DIAGNOSIS — S81802D Unspecified open wound, left lower leg, subsequent encounter: Secondary | ICD-10-CM | POA: Diagnosis not present

## 2021-10-05 DIAGNOSIS — I10 Essential (primary) hypertension: Secondary | ICD-10-CM | POA: Diagnosis not present

## 2021-10-05 DIAGNOSIS — G20 Parkinson's disease: Secondary | ICD-10-CM | POA: Diagnosis not present

## 2021-10-09 DIAGNOSIS — R2689 Other abnormalities of gait and mobility: Secondary | ICD-10-CM | POA: Diagnosis not present

## 2021-10-09 DIAGNOSIS — S72009A Fracture of unspecified part of neck of unspecified femur, initial encounter for closed fracture: Secondary | ICD-10-CM | POA: Diagnosis not present

## 2021-10-09 DIAGNOSIS — R296 Repeated falls: Secondary | ICD-10-CM | POA: Diagnosis not present

## 2021-10-09 DIAGNOSIS — R69 Illness, unspecified: Secondary | ICD-10-CM | POA: Diagnosis not present

## 2021-10-09 DIAGNOSIS — S81802D Unspecified open wound, left lower leg, subsequent encounter: Secondary | ICD-10-CM | POA: Diagnosis not present

## 2021-10-09 DIAGNOSIS — E440 Moderate protein-calorie malnutrition: Secondary | ICD-10-CM | POA: Diagnosis not present

## 2021-10-09 DIAGNOSIS — G20 Parkinson's disease: Secondary | ICD-10-CM | POA: Diagnosis not present

## 2021-10-09 DIAGNOSIS — M6281 Muscle weakness (generalized): Secondary | ICD-10-CM | POA: Diagnosis not present

## 2021-10-09 DIAGNOSIS — Z299 Encounter for prophylactic measures, unspecified: Secondary | ICD-10-CM | POA: Diagnosis not present

## 2021-10-09 DIAGNOSIS — I10 Essential (primary) hypertension: Secondary | ICD-10-CM | POA: Diagnosis not present

## 2021-10-12 DIAGNOSIS — I10 Essential (primary) hypertension: Secondary | ICD-10-CM | POA: Diagnosis not present

## 2021-10-12 DIAGNOSIS — Z7901 Long term (current) use of anticoagulants: Secondary | ICD-10-CM | POA: Diagnosis not present

## 2021-10-12 DIAGNOSIS — Z79899 Other long term (current) drug therapy: Secondary | ICD-10-CM | POA: Diagnosis not present

## 2021-10-12 DIAGNOSIS — L97929 Non-pressure chronic ulcer of unspecified part of left lower leg with unspecified severity: Secondary | ICD-10-CM | POA: Diagnosis not present

## 2021-10-12 DIAGNOSIS — X58XXXD Exposure to other specified factors, subsequent encounter: Secondary | ICD-10-CM | POA: Diagnosis not present

## 2021-10-12 DIAGNOSIS — E785 Hyperlipidemia, unspecified: Secondary | ICD-10-CM | POA: Diagnosis not present

## 2021-10-12 DIAGNOSIS — S81802D Unspecified open wound, left lower leg, subsequent encounter: Secondary | ICD-10-CM | POA: Diagnosis not present

## 2021-10-12 DIAGNOSIS — M549 Dorsalgia, unspecified: Secondary | ICD-10-CM | POA: Diagnosis not present

## 2021-10-12 DIAGNOSIS — E11622 Type 2 diabetes mellitus with other skin ulcer: Secondary | ICD-10-CM | POA: Diagnosis not present

## 2021-10-18 DIAGNOSIS — M25552 Pain in left hip: Secondary | ICD-10-CM | POA: Diagnosis not present

## 2021-10-23 DIAGNOSIS — E1121 Type 2 diabetes mellitus with diabetic nephropathy: Secondary | ICD-10-CM | POA: Diagnosis not present

## 2021-10-23 DIAGNOSIS — G20 Parkinson's disease: Secondary | ICD-10-CM | POA: Diagnosis not present

## 2021-10-23 DIAGNOSIS — Z7901 Long term (current) use of anticoagulants: Secondary | ICD-10-CM | POA: Diagnosis not present

## 2021-10-23 DIAGNOSIS — E1159 Type 2 diabetes mellitus with other circulatory complications: Secondary | ICD-10-CM | POA: Diagnosis not present

## 2021-10-23 DIAGNOSIS — E440 Moderate protein-calorie malnutrition: Secondary | ICD-10-CM | POA: Diagnosis not present

## 2021-10-23 DIAGNOSIS — R41841 Cognitive communication deficit: Secondary | ICD-10-CM | POA: Diagnosis not present

## 2021-10-23 DIAGNOSIS — I152 Hypertension secondary to endocrine disorders: Secondary | ICD-10-CM | POA: Diagnosis not present

## 2021-10-23 DIAGNOSIS — R69 Illness, unspecified: Secondary | ICD-10-CM | POA: Diagnosis not present

## 2021-10-23 DIAGNOSIS — S72002D Fracture of unspecified part of neck of left femur, subsequent encounter for closed fracture with routine healing: Secondary | ICD-10-CM | POA: Diagnosis not present

## 2021-10-23 DIAGNOSIS — G20A1 Parkinson's disease without dyskinesia, without mention of fluctuations: Secondary | ICD-10-CM | POA: Diagnosis not present

## 2021-10-23 DIAGNOSIS — R296 Repeated falls: Secondary | ICD-10-CM | POA: Diagnosis not present

## 2021-10-23 DIAGNOSIS — E785 Hyperlipidemia, unspecified: Secondary | ICD-10-CM | POA: Diagnosis not present

## 2021-10-29 DIAGNOSIS — G894 Chronic pain syndrome: Secondary | ICD-10-CM | POA: Diagnosis not present

## 2021-10-29 DIAGNOSIS — G20 Parkinson's disease: Secondary | ICD-10-CM | POA: Diagnosis not present

## 2021-10-29 DIAGNOSIS — M961 Postlaminectomy syndrome, not elsewhere classified: Secondary | ICD-10-CM | POA: Diagnosis not present

## 2021-10-29 DIAGNOSIS — R296 Repeated falls: Secondary | ICD-10-CM | POA: Diagnosis not present

## 2021-10-30 DIAGNOSIS — I152 Hypertension secondary to endocrine disorders: Secondary | ICD-10-CM | POA: Diagnosis not present

## 2021-10-30 DIAGNOSIS — G20 Parkinson's disease: Secondary | ICD-10-CM | POA: Diagnosis not present

## 2021-10-30 DIAGNOSIS — S72002D Fracture of unspecified part of neck of left femur, subsequent encounter for closed fracture with routine healing: Secondary | ICD-10-CM | POA: Diagnosis not present

## 2021-10-30 DIAGNOSIS — R296 Repeated falls: Secondary | ICD-10-CM | POA: Diagnosis not present

## 2021-10-31 DIAGNOSIS — Z299 Encounter for prophylactic measures, unspecified: Secondary | ICD-10-CM | POA: Diagnosis not present

## 2021-10-31 DIAGNOSIS — D6869 Other thrombophilia: Secondary | ICD-10-CM | POA: Diagnosis not present

## 2021-10-31 DIAGNOSIS — Z09 Encounter for follow-up examination after completed treatment for conditions other than malignant neoplasm: Secondary | ICD-10-CM | POA: Diagnosis not present

## 2021-10-31 DIAGNOSIS — Z8781 Personal history of (healed) traumatic fracture: Secondary | ICD-10-CM | POA: Diagnosis not present

## 2021-10-31 DIAGNOSIS — Z6831 Body mass index (BMI) 31.0-31.9, adult: Secondary | ICD-10-CM | POA: Diagnosis not present

## 2021-10-31 DIAGNOSIS — I10 Essential (primary) hypertension: Secondary | ICD-10-CM | POA: Diagnosis not present

## 2021-11-03 DIAGNOSIS — I10 Essential (primary) hypertension: Secondary | ICD-10-CM | POA: Diagnosis not present

## 2021-11-03 DIAGNOSIS — E1165 Type 2 diabetes mellitus with hyperglycemia: Secondary | ICD-10-CM | POA: Diagnosis not present

## 2021-11-03 DIAGNOSIS — E7800 Pure hypercholesterolemia, unspecified: Secondary | ICD-10-CM | POA: Diagnosis not present

## 2021-11-03 DIAGNOSIS — R600 Localized edema: Secondary | ICD-10-CM | POA: Diagnosis not present

## 2021-11-08 DIAGNOSIS — R2689 Other abnormalities of gait and mobility: Secondary | ICD-10-CM | POA: Diagnosis not present

## 2021-11-08 DIAGNOSIS — G20A1 Parkinson's disease without dyskinesia, without mention of fluctuations: Secondary | ICD-10-CM | POA: Diagnosis not present

## 2021-11-08 DIAGNOSIS — M6281 Muscle weakness (generalized): Secondary | ICD-10-CM | POA: Diagnosis not present

## 2021-11-08 DIAGNOSIS — R296 Repeated falls: Secondary | ICD-10-CM | POA: Diagnosis not present

## 2021-11-08 DIAGNOSIS — S81802D Unspecified open wound, left lower leg, subsequent encounter: Secondary | ICD-10-CM | POA: Diagnosis not present

## 2021-11-15 DIAGNOSIS — M25552 Pain in left hip: Secondary | ICD-10-CM | POA: Diagnosis not present

## 2021-11-28 DIAGNOSIS — Z299 Encounter for prophylactic measures, unspecified: Secondary | ICD-10-CM | POA: Diagnosis not present

## 2021-11-28 DIAGNOSIS — I10 Essential (primary) hypertension: Secondary | ICD-10-CM | POA: Diagnosis not present

## 2021-11-28 DIAGNOSIS — E1165 Type 2 diabetes mellitus with hyperglycemia: Secondary | ICD-10-CM | POA: Diagnosis not present

## 2021-11-28 DIAGNOSIS — Z713 Dietary counseling and surveillance: Secondary | ICD-10-CM | POA: Diagnosis not present

## 2021-12-09 DIAGNOSIS — M6281 Muscle weakness (generalized): Secondary | ICD-10-CM | POA: Diagnosis not present

## 2021-12-09 DIAGNOSIS — G20A1 Parkinson's disease without dyskinesia, without mention of fluctuations: Secondary | ICD-10-CM | POA: Diagnosis not present

## 2021-12-09 DIAGNOSIS — R296 Repeated falls: Secondary | ICD-10-CM | POA: Diagnosis not present

## 2021-12-09 DIAGNOSIS — R2689 Other abnormalities of gait and mobility: Secondary | ICD-10-CM | POA: Diagnosis not present

## 2021-12-09 DIAGNOSIS — S81802D Unspecified open wound, left lower leg, subsequent encounter: Secondary | ICD-10-CM | POA: Diagnosis not present

## 2022-01-08 DIAGNOSIS — S81802D Unspecified open wound, left lower leg, subsequent encounter: Secondary | ICD-10-CM | POA: Diagnosis not present

## 2022-01-08 DIAGNOSIS — M6281 Muscle weakness (generalized): Secondary | ICD-10-CM | POA: Diagnosis not present

## 2022-01-08 DIAGNOSIS — R296 Repeated falls: Secondary | ICD-10-CM | POA: Diagnosis not present

## 2022-01-08 DIAGNOSIS — G20A1 Parkinson's disease without dyskinesia, without mention of fluctuations: Secondary | ICD-10-CM | POA: Diagnosis not present

## 2022-01-08 DIAGNOSIS — R2689 Other abnormalities of gait and mobility: Secondary | ICD-10-CM | POA: Diagnosis not present

## 2022-01-18 DIAGNOSIS — Z87891 Personal history of nicotine dependence: Secondary | ICD-10-CM | POA: Diagnosis not present

## 2022-01-18 DIAGNOSIS — Z1339 Encounter for screening examination for other mental health and behavioral disorders: Secondary | ICD-10-CM | POA: Diagnosis not present

## 2022-01-18 DIAGNOSIS — Z0000 Encounter for general adult medical examination without abnormal findings: Secondary | ICD-10-CM | POA: Diagnosis not present

## 2022-01-18 DIAGNOSIS — Z79899 Other long term (current) drug therapy: Secondary | ICD-10-CM | POA: Diagnosis not present

## 2022-01-18 DIAGNOSIS — Z7189 Other specified counseling: Secondary | ICD-10-CM | POA: Diagnosis not present

## 2022-01-18 DIAGNOSIS — Z299 Encounter for prophylactic measures, unspecified: Secondary | ICD-10-CM | POA: Diagnosis not present

## 2022-01-18 DIAGNOSIS — Z6831 Body mass index (BMI) 31.0-31.9, adult: Secondary | ICD-10-CM | POA: Diagnosis not present

## 2022-01-18 DIAGNOSIS — Z1331 Encounter for screening for depression: Secondary | ICD-10-CM | POA: Diagnosis not present

## 2022-01-18 DIAGNOSIS — R5383 Other fatigue: Secondary | ICD-10-CM | POA: Diagnosis not present

## 2022-01-18 DIAGNOSIS — E7800 Pure hypercholesterolemia, unspecified: Secondary | ICD-10-CM | POA: Diagnosis not present

## 2022-01-18 DIAGNOSIS — I10 Essential (primary) hypertension: Secondary | ICD-10-CM | POA: Diagnosis not present

## 2022-01-21 DIAGNOSIS — G894 Chronic pain syndrome: Secondary | ICD-10-CM | POA: Diagnosis not present

## 2022-01-21 DIAGNOSIS — Z79891 Long term (current) use of opiate analgesic: Secondary | ICD-10-CM | POA: Diagnosis not present

## 2022-01-21 DIAGNOSIS — G20A1 Parkinson's disease without dyskinesia, without mention of fluctuations: Secondary | ICD-10-CM | POA: Diagnosis not present

## 2022-01-21 DIAGNOSIS — R296 Repeated falls: Secondary | ICD-10-CM | POA: Diagnosis not present

## 2022-01-29 DIAGNOSIS — Z1231 Encounter for screening mammogram for malignant neoplasm of breast: Secondary | ICD-10-CM | POA: Diagnosis not present

## 2022-02-08 DIAGNOSIS — M6281 Muscle weakness (generalized): Secondary | ICD-10-CM | POA: Diagnosis not present

## 2022-02-08 DIAGNOSIS — G20A1 Parkinson's disease without dyskinesia, without mention of fluctuations: Secondary | ICD-10-CM | POA: Diagnosis not present

## 2022-02-08 DIAGNOSIS — S81802D Unspecified open wound, left lower leg, subsequent encounter: Secondary | ICD-10-CM | POA: Diagnosis not present

## 2022-02-08 DIAGNOSIS — R296 Repeated falls: Secondary | ICD-10-CM | POA: Diagnosis not present

## 2022-02-08 DIAGNOSIS — R2689 Other abnormalities of gait and mobility: Secondary | ICD-10-CM | POA: Diagnosis not present

## 2022-03-11 DIAGNOSIS — G20A1 Parkinson's disease without dyskinesia, without mention of fluctuations: Secondary | ICD-10-CM | POA: Diagnosis not present

## 2022-03-11 DIAGNOSIS — R2689 Other abnormalities of gait and mobility: Secondary | ICD-10-CM | POA: Diagnosis not present

## 2022-03-11 DIAGNOSIS — S81802D Unspecified open wound, left lower leg, subsequent encounter: Secondary | ICD-10-CM | POA: Diagnosis not present

## 2022-03-11 DIAGNOSIS — R296 Repeated falls: Secondary | ICD-10-CM | POA: Diagnosis not present

## 2022-03-11 DIAGNOSIS — M6281 Muscle weakness (generalized): Secondary | ICD-10-CM | POA: Diagnosis not present

## 2022-03-20 DIAGNOSIS — R279 Unspecified lack of coordination: Secondary | ICD-10-CM | POA: Diagnosis not present

## 2022-03-20 DIAGNOSIS — R69 Illness, unspecified: Secondary | ICD-10-CM | POA: Diagnosis not present

## 2022-03-20 DIAGNOSIS — Z299 Encounter for prophylactic measures, unspecified: Secondary | ICD-10-CM | POA: Diagnosis not present

## 2022-03-20 DIAGNOSIS — M545 Low back pain, unspecified: Secondary | ICD-10-CM | POA: Diagnosis not present

## 2022-03-20 DIAGNOSIS — I10 Essential (primary) hypertension: Secondary | ICD-10-CM | POA: Diagnosis not present

## 2022-03-23 DIAGNOSIS — M48061 Spinal stenosis, lumbar region without neurogenic claudication: Secondary | ICD-10-CM | POA: Diagnosis not present

## 2022-03-23 DIAGNOSIS — E1159 Type 2 diabetes mellitus with other circulatory complications: Secondary | ICD-10-CM | POA: Diagnosis not present

## 2022-03-23 DIAGNOSIS — Z8673 Personal history of transient ischemic attack (TIA), and cerebral infarction without residual deficits: Secondary | ICD-10-CM | POA: Diagnosis not present

## 2022-03-23 DIAGNOSIS — F339 Major depressive disorder, recurrent, unspecified: Secondary | ICD-10-CM | POA: Diagnosis not present

## 2022-03-23 DIAGNOSIS — F0283 Dementia in other diseases classified elsewhere, unspecified severity, with mood disturbance: Secondary | ICD-10-CM | POA: Diagnosis not present

## 2022-03-23 DIAGNOSIS — I152 Hypertension secondary to endocrine disorders: Secondary | ICD-10-CM | POA: Diagnosis not present

## 2022-03-23 DIAGNOSIS — S81812D Laceration without foreign body, left lower leg, subsequent encounter: Secondary | ICD-10-CM | POA: Diagnosis not present

## 2022-03-23 DIAGNOSIS — E440 Moderate protein-calorie malnutrition: Secondary | ICD-10-CM | POA: Diagnosis not present

## 2022-03-23 DIAGNOSIS — Z87891 Personal history of nicotine dependence: Secondary | ICD-10-CM | POA: Diagnosis not present

## 2022-03-23 DIAGNOSIS — G20C Parkinsonism, unspecified: Secondary | ICD-10-CM | POA: Diagnosis not present

## 2022-03-23 DIAGNOSIS — E669 Obesity, unspecified: Secondary | ICD-10-CM | POA: Diagnosis not present

## 2022-03-23 DIAGNOSIS — F0284 Dementia in other diseases classified elsewhere, unspecified severity, with anxiety: Secondary | ICD-10-CM | POA: Diagnosis not present

## 2022-03-23 DIAGNOSIS — Z9181 History of falling: Secondary | ICD-10-CM | POA: Diagnosis not present

## 2022-03-23 DIAGNOSIS — R69 Illness, unspecified: Secondary | ICD-10-CM | POA: Diagnosis not present

## 2022-03-23 DIAGNOSIS — G20A1 Parkinson's disease without dyskinesia, without mention of fluctuations: Secondary | ICD-10-CM | POA: Diagnosis not present

## 2022-03-23 DIAGNOSIS — Z6829 Body mass index (BMI) 29.0-29.9, adult: Secondary | ICD-10-CM | POA: Diagnosis not present

## 2022-03-26 DIAGNOSIS — Z8673 Personal history of transient ischemic attack (TIA), and cerebral infarction without residual deficits: Secondary | ICD-10-CM | POA: Diagnosis not present

## 2022-03-26 DIAGNOSIS — Z9181 History of falling: Secondary | ICD-10-CM | POA: Diagnosis not present

## 2022-03-26 DIAGNOSIS — E669 Obesity, unspecified: Secondary | ICD-10-CM | POA: Diagnosis not present

## 2022-03-26 DIAGNOSIS — Z87891 Personal history of nicotine dependence: Secondary | ICD-10-CM | POA: Diagnosis not present

## 2022-03-26 DIAGNOSIS — E440 Moderate protein-calorie malnutrition: Secondary | ICD-10-CM | POA: Diagnosis not present

## 2022-03-26 DIAGNOSIS — I152 Hypertension secondary to endocrine disorders: Secondary | ICD-10-CM | POA: Diagnosis not present

## 2022-03-26 DIAGNOSIS — E1159 Type 2 diabetes mellitus with other circulatory complications: Secondary | ICD-10-CM | POA: Diagnosis not present

## 2022-03-26 DIAGNOSIS — R69 Illness, unspecified: Secondary | ICD-10-CM | POA: Diagnosis not present

## 2022-03-26 DIAGNOSIS — Z6829 Body mass index (BMI) 29.0-29.9, adult: Secondary | ICD-10-CM | POA: Diagnosis not present

## 2022-03-26 DIAGNOSIS — M48061 Spinal stenosis, lumbar region without neurogenic claudication: Secondary | ICD-10-CM | POA: Diagnosis not present

## 2022-03-26 DIAGNOSIS — G20A1 Parkinson's disease without dyskinesia, without mention of fluctuations: Secondary | ICD-10-CM | POA: Diagnosis not present

## 2022-03-28 DIAGNOSIS — Z8673 Personal history of transient ischemic attack (TIA), and cerebral infarction without residual deficits: Secondary | ICD-10-CM | POA: Diagnosis not present

## 2022-03-28 DIAGNOSIS — Z9181 History of falling: Secondary | ICD-10-CM | POA: Diagnosis not present

## 2022-03-28 DIAGNOSIS — E669 Obesity, unspecified: Secondary | ICD-10-CM | POA: Diagnosis not present

## 2022-03-28 DIAGNOSIS — M48061 Spinal stenosis, lumbar region without neurogenic claudication: Secondary | ICD-10-CM | POA: Diagnosis not present

## 2022-03-28 DIAGNOSIS — G20A1 Parkinson's disease without dyskinesia, without mention of fluctuations: Secondary | ICD-10-CM | POA: Diagnosis not present

## 2022-03-28 DIAGNOSIS — Z6829 Body mass index (BMI) 29.0-29.9, adult: Secondary | ICD-10-CM | POA: Diagnosis not present

## 2022-03-28 DIAGNOSIS — E1159 Type 2 diabetes mellitus with other circulatory complications: Secondary | ICD-10-CM | POA: Diagnosis not present

## 2022-03-28 DIAGNOSIS — Z87891 Personal history of nicotine dependence: Secondary | ICD-10-CM | POA: Diagnosis not present

## 2022-03-28 DIAGNOSIS — I152 Hypertension secondary to endocrine disorders: Secondary | ICD-10-CM | POA: Diagnosis not present

## 2022-03-28 DIAGNOSIS — R69 Illness, unspecified: Secondary | ICD-10-CM | POA: Diagnosis not present

## 2022-03-28 DIAGNOSIS — E440 Moderate protein-calorie malnutrition: Secondary | ICD-10-CM | POA: Diagnosis not present

## 2022-04-01 DIAGNOSIS — G20A1 Parkinson's disease without dyskinesia, without mention of fluctuations: Secondary | ICD-10-CM | POA: Diagnosis not present

## 2022-04-01 DIAGNOSIS — Z8673 Personal history of transient ischemic attack (TIA), and cerebral infarction without residual deficits: Secondary | ICD-10-CM | POA: Diagnosis not present

## 2022-04-01 DIAGNOSIS — E1159 Type 2 diabetes mellitus with other circulatory complications: Secondary | ICD-10-CM | POA: Diagnosis not present

## 2022-04-01 DIAGNOSIS — Z6829 Body mass index (BMI) 29.0-29.9, adult: Secondary | ICD-10-CM | POA: Diagnosis not present

## 2022-04-01 DIAGNOSIS — E440 Moderate protein-calorie malnutrition: Secondary | ICD-10-CM | POA: Diagnosis not present

## 2022-04-01 DIAGNOSIS — M48061 Spinal stenosis, lumbar region without neurogenic claudication: Secondary | ICD-10-CM | POA: Diagnosis not present

## 2022-04-01 DIAGNOSIS — Z9181 History of falling: Secondary | ICD-10-CM | POA: Diagnosis not present

## 2022-04-01 DIAGNOSIS — I152 Hypertension secondary to endocrine disorders: Secondary | ICD-10-CM | POA: Diagnosis not present

## 2022-04-01 DIAGNOSIS — Z87891 Personal history of nicotine dependence: Secondary | ICD-10-CM | POA: Diagnosis not present

## 2022-04-01 DIAGNOSIS — R69 Illness, unspecified: Secondary | ICD-10-CM | POA: Diagnosis not present

## 2022-04-01 DIAGNOSIS — E669 Obesity, unspecified: Secondary | ICD-10-CM | POA: Diagnosis not present

## 2022-04-03 DIAGNOSIS — Z9181 History of falling: Secondary | ICD-10-CM | POA: Diagnosis not present

## 2022-04-03 DIAGNOSIS — Z8673 Personal history of transient ischemic attack (TIA), and cerebral infarction without residual deficits: Secondary | ICD-10-CM | POA: Diagnosis not present

## 2022-04-03 DIAGNOSIS — G20A1 Parkinson's disease without dyskinesia, without mention of fluctuations: Secondary | ICD-10-CM | POA: Diagnosis not present

## 2022-04-03 DIAGNOSIS — E1159 Type 2 diabetes mellitus with other circulatory complications: Secondary | ICD-10-CM | POA: Diagnosis not present

## 2022-04-03 DIAGNOSIS — E669 Obesity, unspecified: Secondary | ICD-10-CM | POA: Diagnosis not present

## 2022-04-03 DIAGNOSIS — R69 Illness, unspecified: Secondary | ICD-10-CM | POA: Diagnosis not present

## 2022-04-03 DIAGNOSIS — Z87891 Personal history of nicotine dependence: Secondary | ICD-10-CM | POA: Diagnosis not present

## 2022-04-03 DIAGNOSIS — I152 Hypertension secondary to endocrine disorders: Secondary | ICD-10-CM | POA: Diagnosis not present

## 2022-04-03 DIAGNOSIS — E440 Moderate protein-calorie malnutrition: Secondary | ICD-10-CM | POA: Diagnosis not present

## 2022-04-03 DIAGNOSIS — M48061 Spinal stenosis, lumbar region without neurogenic claudication: Secondary | ICD-10-CM | POA: Diagnosis not present

## 2022-04-03 DIAGNOSIS — Z6829 Body mass index (BMI) 29.0-29.9, adult: Secondary | ICD-10-CM | POA: Diagnosis not present

## 2022-04-04 ENCOUNTER — Encounter: Payer: Self-pay | Admitting: Radiology

## 2022-04-08 DIAGNOSIS — E440 Moderate protein-calorie malnutrition: Secondary | ICD-10-CM | POA: Diagnosis not present

## 2022-04-08 DIAGNOSIS — Z87891 Personal history of nicotine dependence: Secondary | ICD-10-CM | POA: Diagnosis not present

## 2022-04-08 DIAGNOSIS — Z8673 Personal history of transient ischemic attack (TIA), and cerebral infarction without residual deficits: Secondary | ICD-10-CM | POA: Diagnosis not present

## 2022-04-08 DIAGNOSIS — E1159 Type 2 diabetes mellitus with other circulatory complications: Secondary | ICD-10-CM | POA: Diagnosis not present

## 2022-04-08 DIAGNOSIS — Z9181 History of falling: Secondary | ICD-10-CM | POA: Diagnosis not present

## 2022-04-08 DIAGNOSIS — Z6829 Body mass index (BMI) 29.0-29.9, adult: Secondary | ICD-10-CM | POA: Diagnosis not present

## 2022-04-08 DIAGNOSIS — M48061 Spinal stenosis, lumbar region without neurogenic claudication: Secondary | ICD-10-CM | POA: Diagnosis not present

## 2022-04-08 DIAGNOSIS — R69 Illness, unspecified: Secondary | ICD-10-CM | POA: Diagnosis not present

## 2022-04-08 DIAGNOSIS — E669 Obesity, unspecified: Secondary | ICD-10-CM | POA: Diagnosis not present

## 2022-04-08 DIAGNOSIS — G20A1 Parkinson's disease without dyskinesia, without mention of fluctuations: Secondary | ICD-10-CM | POA: Diagnosis not present

## 2022-04-08 DIAGNOSIS — I152 Hypertension secondary to endocrine disorders: Secondary | ICD-10-CM | POA: Diagnosis not present

## 2022-04-09 DIAGNOSIS — Z0000 Encounter for general adult medical examination without abnormal findings: Secondary | ICD-10-CM | POA: Diagnosis not present

## 2022-04-09 DIAGNOSIS — R296 Repeated falls: Secondary | ICD-10-CM | POA: Diagnosis not present

## 2022-04-09 DIAGNOSIS — I10 Essential (primary) hypertension: Secondary | ICD-10-CM | POA: Diagnosis not present

## 2022-04-09 DIAGNOSIS — G20A1 Parkinson's disease without dyskinesia, without mention of fluctuations: Secondary | ICD-10-CM | POA: Diagnosis not present

## 2022-04-09 DIAGNOSIS — S81802D Unspecified open wound, left lower leg, subsequent encounter: Secondary | ICD-10-CM | POA: Diagnosis not present

## 2022-04-09 DIAGNOSIS — F339 Major depressive disorder, recurrent, unspecified: Secondary | ICD-10-CM | POA: Diagnosis not present

## 2022-04-09 DIAGNOSIS — Z6833 Body mass index (BMI) 33.0-33.9, adult: Secondary | ICD-10-CM | POA: Diagnosis not present

## 2022-04-09 DIAGNOSIS — R2689 Other abnormalities of gait and mobility: Secondary | ICD-10-CM | POA: Diagnosis not present

## 2022-04-09 DIAGNOSIS — Z87891 Personal history of nicotine dependence: Secondary | ICD-10-CM | POA: Diagnosis not present

## 2022-04-09 DIAGNOSIS — Z1331 Encounter for screening for depression: Secondary | ICD-10-CM | POA: Diagnosis not present

## 2022-04-09 DIAGNOSIS — Z1339 Encounter for screening examination for other mental health and behavioral disorders: Secondary | ICD-10-CM | POA: Diagnosis not present

## 2022-04-09 DIAGNOSIS — Z7189 Other specified counseling: Secondary | ICD-10-CM | POA: Diagnosis not present

## 2022-04-09 DIAGNOSIS — M6281 Muscle weakness (generalized): Secondary | ICD-10-CM | POA: Diagnosis not present

## 2022-04-09 DIAGNOSIS — Z299 Encounter for prophylactic measures, unspecified: Secondary | ICD-10-CM | POA: Diagnosis not present

## 2022-04-09 DIAGNOSIS — F0390 Unspecified dementia without behavioral disturbance: Secondary | ICD-10-CM | POA: Diagnosis not present

## 2022-04-10 DIAGNOSIS — E1159 Type 2 diabetes mellitus with other circulatory complications: Secondary | ICD-10-CM | POA: Diagnosis not present

## 2022-04-10 DIAGNOSIS — Z6829 Body mass index (BMI) 29.0-29.9, adult: Secondary | ICD-10-CM | POA: Diagnosis not present

## 2022-04-10 DIAGNOSIS — Z9181 History of falling: Secondary | ICD-10-CM | POA: Diagnosis not present

## 2022-04-10 DIAGNOSIS — Z8673 Personal history of transient ischemic attack (TIA), and cerebral infarction without residual deficits: Secondary | ICD-10-CM | POA: Diagnosis not present

## 2022-04-10 DIAGNOSIS — Z87891 Personal history of nicotine dependence: Secondary | ICD-10-CM | POA: Diagnosis not present

## 2022-04-10 DIAGNOSIS — I152 Hypertension secondary to endocrine disorders: Secondary | ICD-10-CM | POA: Diagnosis not present

## 2022-04-10 DIAGNOSIS — E440 Moderate protein-calorie malnutrition: Secondary | ICD-10-CM | POA: Diagnosis not present

## 2022-04-10 DIAGNOSIS — M48061 Spinal stenosis, lumbar region without neurogenic claudication: Secondary | ICD-10-CM | POA: Diagnosis not present

## 2022-04-10 DIAGNOSIS — E669 Obesity, unspecified: Secondary | ICD-10-CM | POA: Diagnosis not present

## 2022-04-10 DIAGNOSIS — R69 Illness, unspecified: Secondary | ICD-10-CM | POA: Diagnosis not present

## 2022-04-10 DIAGNOSIS — G20A1 Parkinson's disease without dyskinesia, without mention of fluctuations: Secondary | ICD-10-CM | POA: Diagnosis not present

## 2022-04-19 DIAGNOSIS — G20A1 Parkinson's disease without dyskinesia, without mention of fluctuations: Secondary | ICD-10-CM | POA: Diagnosis not present

## 2022-04-19 DIAGNOSIS — Z9181 History of falling: Secondary | ICD-10-CM | POA: Diagnosis not present

## 2022-04-19 DIAGNOSIS — E1159 Type 2 diabetes mellitus with other circulatory complications: Secondary | ICD-10-CM | POA: Diagnosis not present

## 2022-04-19 DIAGNOSIS — Z87891 Personal history of nicotine dependence: Secondary | ICD-10-CM | POA: Diagnosis not present

## 2022-04-19 DIAGNOSIS — E669 Obesity, unspecified: Secondary | ICD-10-CM | POA: Diagnosis not present

## 2022-04-19 DIAGNOSIS — Z6829 Body mass index (BMI) 29.0-29.9, adult: Secondary | ICD-10-CM | POA: Diagnosis not present

## 2022-04-19 DIAGNOSIS — M48061 Spinal stenosis, lumbar region without neurogenic claudication: Secondary | ICD-10-CM | POA: Diagnosis not present

## 2022-04-19 DIAGNOSIS — E440 Moderate protein-calorie malnutrition: Secondary | ICD-10-CM | POA: Diagnosis not present

## 2022-04-19 DIAGNOSIS — R69 Illness, unspecified: Secondary | ICD-10-CM | POA: Diagnosis not present

## 2022-04-19 DIAGNOSIS — I152 Hypertension secondary to endocrine disorders: Secondary | ICD-10-CM | POA: Diagnosis not present

## 2022-04-19 DIAGNOSIS — Z8673 Personal history of transient ischemic attack (TIA), and cerebral infarction without residual deficits: Secondary | ICD-10-CM | POA: Diagnosis not present

## 2022-04-24 DIAGNOSIS — E1159 Type 2 diabetes mellitus with other circulatory complications: Secondary | ICD-10-CM | POA: Diagnosis not present

## 2022-04-24 DIAGNOSIS — Z9181 History of falling: Secondary | ICD-10-CM | POA: Diagnosis not present

## 2022-04-24 DIAGNOSIS — G20A1 Parkinson's disease without dyskinesia, without mention of fluctuations: Secondary | ICD-10-CM | POA: Diagnosis not present

## 2022-04-24 DIAGNOSIS — F339 Major depressive disorder, recurrent, unspecified: Secondary | ICD-10-CM | POA: Diagnosis not present

## 2022-04-24 DIAGNOSIS — Z8673 Personal history of transient ischemic attack (TIA), and cerebral infarction without residual deficits: Secondary | ICD-10-CM | POA: Diagnosis not present

## 2022-04-24 DIAGNOSIS — F0284 Dementia in other diseases classified elsewhere, unspecified severity, with anxiety: Secondary | ICD-10-CM | POA: Diagnosis not present

## 2022-04-24 DIAGNOSIS — I152 Hypertension secondary to endocrine disorders: Secondary | ICD-10-CM | POA: Diagnosis not present

## 2022-04-24 DIAGNOSIS — M48061 Spinal stenosis, lumbar region without neurogenic claudication: Secondary | ICD-10-CM | POA: Diagnosis not present

## 2022-04-24 DIAGNOSIS — Z6829 Body mass index (BMI) 29.0-29.9, adult: Secondary | ICD-10-CM | POA: Diagnosis not present

## 2022-04-24 DIAGNOSIS — E669 Obesity, unspecified: Secondary | ICD-10-CM | POA: Diagnosis not present

## 2022-04-24 DIAGNOSIS — Z87891 Personal history of nicotine dependence: Secondary | ICD-10-CM | POA: Diagnosis not present

## 2022-04-24 DIAGNOSIS — F0283 Dementia in other diseases classified elsewhere, unspecified severity, with mood disturbance: Secondary | ICD-10-CM | POA: Diagnosis not present

## 2022-04-24 DIAGNOSIS — E440 Moderate protein-calorie malnutrition: Secondary | ICD-10-CM | POA: Diagnosis not present

## 2022-05-03 DIAGNOSIS — I152 Hypertension secondary to endocrine disorders: Secondary | ICD-10-CM | POA: Diagnosis not present

## 2022-05-03 DIAGNOSIS — Z87891 Personal history of nicotine dependence: Secondary | ICD-10-CM | POA: Diagnosis not present

## 2022-05-03 DIAGNOSIS — Z6829 Body mass index (BMI) 29.0-29.9, adult: Secondary | ICD-10-CM | POA: Diagnosis not present

## 2022-05-03 DIAGNOSIS — F339 Major depressive disorder, recurrent, unspecified: Secondary | ICD-10-CM | POA: Diagnosis not present

## 2022-05-03 DIAGNOSIS — E669 Obesity, unspecified: Secondary | ICD-10-CM | POA: Diagnosis not present

## 2022-05-03 DIAGNOSIS — Z9181 History of falling: Secondary | ICD-10-CM | POA: Diagnosis not present

## 2022-05-03 DIAGNOSIS — F0283 Dementia in other diseases classified elsewhere, unspecified severity, with mood disturbance: Secondary | ICD-10-CM | POA: Diagnosis not present

## 2022-05-03 DIAGNOSIS — F0284 Dementia in other diseases classified elsewhere, unspecified severity, with anxiety: Secondary | ICD-10-CM | POA: Diagnosis not present

## 2022-05-03 DIAGNOSIS — M48061 Spinal stenosis, lumbar region without neurogenic claudication: Secondary | ICD-10-CM | POA: Diagnosis not present

## 2022-05-03 DIAGNOSIS — Z8673 Personal history of transient ischemic attack (TIA), and cerebral infarction without residual deficits: Secondary | ICD-10-CM | POA: Diagnosis not present

## 2022-05-03 DIAGNOSIS — E440 Moderate protein-calorie malnutrition: Secondary | ICD-10-CM | POA: Diagnosis not present

## 2022-05-03 DIAGNOSIS — G20A1 Parkinson's disease without dyskinesia, without mention of fluctuations: Secondary | ICD-10-CM | POA: Diagnosis not present

## 2022-05-03 DIAGNOSIS — E1159 Type 2 diabetes mellitus with other circulatory complications: Secondary | ICD-10-CM | POA: Diagnosis not present

## 2022-05-09 DIAGNOSIS — G20A1 Parkinson's disease without dyskinesia, without mention of fluctuations: Secondary | ICD-10-CM | POA: Diagnosis not present

## 2022-05-09 DIAGNOSIS — M48061 Spinal stenosis, lumbar region without neurogenic claudication: Secondary | ICD-10-CM | POA: Diagnosis not present

## 2022-05-09 DIAGNOSIS — F339 Major depressive disorder, recurrent, unspecified: Secondary | ICD-10-CM | POA: Diagnosis not present

## 2022-05-09 DIAGNOSIS — E669 Obesity, unspecified: Secondary | ICD-10-CM | POA: Diagnosis not present

## 2022-05-09 DIAGNOSIS — F0283 Dementia in other diseases classified elsewhere, unspecified severity, with mood disturbance: Secondary | ICD-10-CM | POA: Diagnosis not present

## 2022-05-09 DIAGNOSIS — F0284 Dementia in other diseases classified elsewhere, unspecified severity, with anxiety: Secondary | ICD-10-CM | POA: Diagnosis not present

## 2022-05-09 DIAGNOSIS — E440 Moderate protein-calorie malnutrition: Secondary | ICD-10-CM | POA: Diagnosis not present

## 2022-05-09 DIAGNOSIS — I152 Hypertension secondary to endocrine disorders: Secondary | ICD-10-CM | POA: Diagnosis not present

## 2022-05-09 DIAGNOSIS — Z6829 Body mass index (BMI) 29.0-29.9, adult: Secondary | ICD-10-CM | POA: Diagnosis not present

## 2022-05-09 DIAGNOSIS — E1159 Type 2 diabetes mellitus with other circulatory complications: Secondary | ICD-10-CM | POA: Diagnosis not present

## 2022-05-09 DIAGNOSIS — Z8673 Personal history of transient ischemic attack (TIA), and cerebral infarction without residual deficits: Secondary | ICD-10-CM | POA: Diagnosis not present

## 2022-05-09 DIAGNOSIS — Z9181 History of falling: Secondary | ICD-10-CM | POA: Diagnosis not present

## 2022-05-09 DIAGNOSIS — Z87891 Personal history of nicotine dependence: Secondary | ICD-10-CM | POA: Diagnosis not present

## 2022-05-10 DIAGNOSIS — R296 Repeated falls: Secondary | ICD-10-CM | POA: Diagnosis not present

## 2022-05-10 DIAGNOSIS — R2689 Other abnormalities of gait and mobility: Secondary | ICD-10-CM | POA: Diagnosis not present

## 2022-05-10 DIAGNOSIS — M6281 Muscle weakness (generalized): Secondary | ICD-10-CM | POA: Diagnosis not present

## 2022-05-10 DIAGNOSIS — S81802D Unspecified open wound, left lower leg, subsequent encounter: Secondary | ICD-10-CM | POA: Diagnosis not present

## 2022-05-10 DIAGNOSIS — G20A1 Parkinson's disease without dyskinesia, without mention of fluctuations: Secondary | ICD-10-CM | POA: Diagnosis not present

## 2022-05-15 DIAGNOSIS — M48061 Spinal stenosis, lumbar region without neurogenic claudication: Secondary | ICD-10-CM | POA: Diagnosis not present

## 2022-05-15 DIAGNOSIS — F339 Major depressive disorder, recurrent, unspecified: Secondary | ICD-10-CM | POA: Diagnosis not present

## 2022-05-15 DIAGNOSIS — G20A1 Parkinson's disease without dyskinesia, without mention of fluctuations: Secondary | ICD-10-CM | POA: Diagnosis not present

## 2022-05-15 DIAGNOSIS — Z87891 Personal history of nicotine dependence: Secondary | ICD-10-CM | POA: Diagnosis not present

## 2022-05-15 DIAGNOSIS — E669 Obesity, unspecified: Secondary | ICD-10-CM | POA: Diagnosis not present

## 2022-05-15 DIAGNOSIS — F0283 Dementia in other diseases classified elsewhere, unspecified severity, with mood disturbance: Secondary | ICD-10-CM | POA: Diagnosis not present

## 2022-05-15 DIAGNOSIS — Z9181 History of falling: Secondary | ICD-10-CM | POA: Diagnosis not present

## 2022-05-15 DIAGNOSIS — E1159 Type 2 diabetes mellitus with other circulatory complications: Secondary | ICD-10-CM | POA: Diagnosis not present

## 2022-05-15 DIAGNOSIS — E440 Moderate protein-calorie malnutrition: Secondary | ICD-10-CM | POA: Diagnosis not present

## 2022-05-15 DIAGNOSIS — I152 Hypertension secondary to endocrine disorders: Secondary | ICD-10-CM | POA: Diagnosis not present

## 2022-05-15 DIAGNOSIS — Z6829 Body mass index (BMI) 29.0-29.9, adult: Secondary | ICD-10-CM | POA: Diagnosis not present

## 2022-05-15 DIAGNOSIS — F0284 Dementia in other diseases classified elsewhere, unspecified severity, with anxiety: Secondary | ICD-10-CM | POA: Diagnosis not present

## 2022-05-15 DIAGNOSIS — Z8673 Personal history of transient ischemic attack (TIA), and cerebral infarction without residual deficits: Secondary | ICD-10-CM | POA: Diagnosis not present

## 2022-05-21 DIAGNOSIS — M48061 Spinal stenosis, lumbar region without neurogenic claudication: Secondary | ICD-10-CM | POA: Diagnosis not present

## 2022-05-21 DIAGNOSIS — Z87891 Personal history of nicotine dependence: Secondary | ICD-10-CM | POA: Diagnosis not present

## 2022-05-21 DIAGNOSIS — Z6829 Body mass index (BMI) 29.0-29.9, adult: Secondary | ICD-10-CM | POA: Diagnosis not present

## 2022-05-21 DIAGNOSIS — E669 Obesity, unspecified: Secondary | ICD-10-CM | POA: Diagnosis not present

## 2022-05-21 DIAGNOSIS — E1159 Type 2 diabetes mellitus with other circulatory complications: Secondary | ICD-10-CM | POA: Diagnosis not present

## 2022-05-21 DIAGNOSIS — E440 Moderate protein-calorie malnutrition: Secondary | ICD-10-CM | POA: Diagnosis not present

## 2022-05-21 DIAGNOSIS — Z8673 Personal history of transient ischemic attack (TIA), and cerebral infarction without residual deficits: Secondary | ICD-10-CM | POA: Diagnosis not present

## 2022-05-21 DIAGNOSIS — Z9181 History of falling: Secondary | ICD-10-CM | POA: Diagnosis not present

## 2022-05-21 DIAGNOSIS — F0283 Dementia in other diseases classified elsewhere, unspecified severity, with mood disturbance: Secondary | ICD-10-CM | POA: Diagnosis not present

## 2022-05-21 DIAGNOSIS — G20A1 Parkinson's disease without dyskinesia, without mention of fluctuations: Secondary | ICD-10-CM | POA: Diagnosis not present

## 2022-05-21 DIAGNOSIS — F0284 Dementia in other diseases classified elsewhere, unspecified severity, with anxiety: Secondary | ICD-10-CM | POA: Diagnosis not present

## 2022-05-21 DIAGNOSIS — I152 Hypertension secondary to endocrine disorders: Secondary | ICD-10-CM | POA: Diagnosis not present

## 2022-05-21 DIAGNOSIS — F339 Major depressive disorder, recurrent, unspecified: Secondary | ICD-10-CM | POA: Diagnosis not present

## 2022-05-22 ENCOUNTER — Encounter: Payer: Self-pay | Admitting: Diagnostic Neuroimaging

## 2022-05-22 ENCOUNTER — Ambulatory Visit: Payer: Medicare HMO | Admitting: Diagnostic Neuroimaging

## 2022-05-22 VITALS — BP 167/78 | HR 63 | Ht 64.0 in | Wt 178.0 lb

## 2022-05-22 DIAGNOSIS — G20A2 Parkinson's disease without dyskinesia, with fluctuations: Secondary | ICD-10-CM | POA: Diagnosis not present

## 2022-05-22 MED ORDER — CARBIDOPA-LEVODOPA 25-100 MG PO TABS: 1.0000 | ORAL_TABLET | Freq: Four times a day (QID) | ORAL | 4 refills | Status: AC

## 2022-05-22 NOTE — Progress Notes (Signed)
[facility]  PATIENT: Vanessa Stephens DOB: 04-Mar-1950  REFERRING CLINICIAN: Ignatius Specking, MD HISTORY FROM: patient  REASON FOR VISIT: new consult   HISTORICAL  CHIEF COMPLAINT:  Chief Complaint  Patient presents with   Tremors    RM 7 with granddaughter Huntley Dec Pt is well, has had tremors and imbalance for 5 years. Was diagnosis with parkinson last year. Was primary having tremors in hands, tremors have reduced recently .    HISTORY OF PRESENT ILLNESS:   72 year old female here for evaluation of Parkinson disease.  Patient had onset of tremor in hands, gait difficulty, memory difficulty starting around 2019.  In 2023 she was evaluated and diagnosed Parkinson disease.  She was started on carbidopa levodopa with good results and reduction tremor.  She is taking this at 6 AM, noon and 6 PM.  Symptoms improved within 1 hour of taking medication.  Symptoms usually are well-controlled for first dose, second dose, but wear off for the third dose.  Still having multiple issues with falls and gait difficulty.  Using a walker most of the time at home.  Has granddaughter at home who gives great support at home.   REVIEW OF SYSTEMS: Full 14 system review of systems performed and negative with exception of: as per HPI.  ALLERGIES: Allergies  Allergen Reactions   Codeine Nausea And Vomiting    Patient stated she was not allergic to codeine anymore     HOME MEDICATIONS: Outpatient Medications Prior to Visit  Medication Sig Dispense Refill   cyanocobalamin (VITAMIN B12) 1000 MCG tablet Take 1,000 mcg by mouth daily.     DULoxetine (CYMBALTA) 30 MG capsule Take 30 mg by mouth 2 (two) times daily.     furosemide (LASIX) 40 MG tablet Take 40 mg by mouth daily as needed.     metoprolol tartrate (LOPRESSOR) 50 MG tablet Take 1 tablet by mouth daily.     oxyCODONE-acetaminophen (PERCOCET/ROXICET) 5-325 MG tablet Take 1 tablet twice a day by oral route as needed.      Potassium 99 MG TABS Take by mouth daily.     carbidopa-levodopa (SINEMET IR) 25-100 MG tablet Take 1 tablet by mouth 3 (three) times daily.     acetaminophen (TYLENOL) 325 MG tablet Take 2 tablets (650 mg total) by mouth every 6 (six) hours as needed for mild pain (or Fever >/= 101). (Patient not taking: Reported on 05/22/2022) 12 tablet 0   alendronate (FOSAMAX) 70 MG tablet Take 70 mg by mouth once a week. (Patient not taking: Reported on 05/22/2022)     DULoxetine (CYMBALTA) 60 MG capsule Take 1 capsule (60 mg total) by mouth daily. (Patient not taking: Reported on 05/22/2022) 30 capsule 0   hydrALAZINE (APRESOLINE) 25 MG tablet Take 25 mg by mouth 2 (two) times daily as needed. (Patient not taking: Reported on 05/22/2022)     insulin aspart (NOVOLOG) 100 UNIT/ML FlexPen Inject 5 Units into the skin 3 (three) times daily with meals. Special Instructions: Give 5 units if cbg > 150. DO NOT GIVE MORE THAN 10 MINUTES BEFORE A MEAL (Patient not taking: Reported on 05/22/2022) 15 mL 0   insulin detemir (LEVEMIR) 100 UNIT/ML injection Inject 0.1 mLs (10 Units total) into the skin daily. (Patient not taking: Reported on 05/22/2022) 10 mL 0   lisinopril-hydrochlorothiazide (ZESTORETIC) 20-12.5 MG tablet Take 1 tablet by mouth daily. (Patient not taking: Reported on 05/22/2022) 30 tablet 0   metFORMIN (GLUCOPHAGE) 500 MG tablet Take 2 tablets (  1,000 mg total) by mouth in the morning and at bedtime. (Patient not taking: Reported on 05/22/2022) 120 tablet 0   methocarbamol (ROBAXIN) 500 MG tablet Take 1 tablet (500 mg total) by mouth 4 (four) times daily. (Patient not taking: Reported on 05/22/2022) 120 tablet 0   metoprolol tartrate (LOPRESSOR) 50 MG tablet Take 1 tablet (50 mg total) by mouth daily. (Patient not taking: Reported on 05/22/2022) 30 tablet 0   NON FORMULARY Diet:NAS and Regular Liquids (Patient not taking: Reported on 05/22/2022)     polyethylene glycol (MIRALAX / GLYCOLAX) 17 g packet Take 17 g by mouth  daily. (Patient not taking: Reported on 05/22/2022) 60 each 2   potassium chloride (KLOR-CON) 10 MEQ tablet Take 1 tablet (10 mEq total) by mouth daily. Take While taking HCTZ (Patient not taking: Reported on 05/22/2022) 30 tablet 0   rosuvastatin (CRESTOR) 5 MG tablet Take 1 tablet (5 mg total) by mouth daily. (Patient not taking: Reported on 05/22/2022) 30 tablet 0   No facility-administered medications prior to visit.    PAST MEDICAL HISTORY: Past Medical History:  Diagnosis Date   Cancer    Diabetes mellitus without complication    Hypertension     PAST SURGICAL HISTORY: Past Surgical History:  Procedure Laterality Date   BACK SURGERY     BREAST LUMPECTOMY     CHOLECYSTECTOMY      FAMILY HISTORY: History reviewed. No pertinent family history.  SOCIAL HISTORY: Social History   Socioeconomic History   Marital status: Married    Spouse name: Not on file   Number of children: Not on file   Years of education: Not on file   Highest education level: Not on file  Occupational History   Not on file  Tobacco Use   Smoking status: Former    Types: Cigarettes   Smokeless tobacco: Never  Vaping Use   Vaping Use: Never used  Substance and Sexual Activity   Alcohol use: Not Currently   Drug use: Never   Sexual activity: Not on file  Other Topics Concern   Not on file  Social History Narrative   Not on file   Social Determinants of Health   Financial Resource Strain: Not on file  Food Insecurity: Not on file  Transportation Needs: Not on file  Physical Activity: Not on file  Stress: Not on file  Social Connections: Not on file  Intimate Partner Violence: Not on file     PHYSICAL EXAM  GENERAL EXAM/CONSTITUTIONAL: Vitals:  Vitals:   05/22/22 1121 05/22/22 1125  BP: (!) 187/71 (!) 167/78  Pulse: 63 63  Weight: 178 lb (80.7 kg)   Height:  (1.626 m)    Body mass index is 30.55 kg/m. Wt Readings from Last 3 Encounters:  05/22/22 178 lb (80.7 kg)   04/13/21 175 lb (79.4 kg)  10/16/20 175 lb 6.4 oz (79.6 kg)   Patient is in no distress; well developed, nourished and groomed; neck is supple  CARDIOVASCULAR: Examination of carotid arteries is normal; no carotid bruits Regular rate and rhythm, no murmurs Examination of peripheral vascular system by observation and palpation is normal  EYES: Ophthalmoscopic exam of optic discs and posterior segments is normal; no papilledema or hemorrhages No results found.  MUSCULOSKELETAL: Gait, strength, tone, movements noted in Neurologic exam below  NEUROLOGIC: MENTAL STATUS:      No data to display         awake, alert, oriented to person, place and time recent and  remote memory intact normal attention and concentration language fluent, comprehension intact, naming intact fund of knowledge appropriate  CRANIAL NERVE:  2nd - no papilledema on fundoscopic exam 2nd, 3rd, 4th, 6th - pupils equal and reactive to light, visual fields full to confrontation, extraocular muscles intact, no nystagmus 5th - facial sensation symmetric 7th - facial strength symmetric 8th - hearing intact 9th - palate elevates symmetrically, uvula midline 11th - shoulder shrug symmetric 12th - tongue protrusion midline HOARSE VOICE; MASKED FACIES  MOTOR:  normal bulk and tone, full strength in the BUE, BLE BRADYKINESIA IN BUE AND BLE; LEFT WORSE THAN RIGHT NO TREMOR; MILD COGWHEELING IN LUE WITH REINFORCEMENT  SENSORY:  normal and symmetric to light touch, temperature, vibration  COORDINATION:  finger-nose-finger, fine finger movements normal  REFLEXES:  deep tendon reflexes TRACEand symmetric  GAIT/STATION:  narrow based gait; SHORT STEPS; STOOPED POSTURE; USING ROLLATOR     DIAGNOSTIC DATA (LABS, IMAGING, TESTING) - I reviewed patient records, labs, notes, testing and imaging myself where available.  Lab Results  Component Value Date   WBC 5.9 04/13/2021   HGB 13.1 04/13/2021   HCT  41.8 04/13/2021   MCV 82.4 04/13/2021   PLT 238 04/13/2021      Component Value Date/Time   NA 140 04/13/2021 1116   K 3.5 04/13/2021 1116   CL 105 04/13/2021 1116   CO2 27 04/13/2021 1116   GLUCOSE 127 (H) 04/13/2021 1116   BUN 13 04/13/2021 1116   CREATININE 0.75 04/13/2021 1116   CALCIUM 9.3 04/13/2021 1116   PROT 6.5 09/28/2020 0631   ALBUMIN 3.1 (L) 09/28/2020 0631   AST 34 09/28/2020 0631   ALT 40 09/28/2020 0631   ALKPHOS 66 09/28/2020 0631   BILITOT 0.5 09/28/2020 0631   GFRNONAA >60 04/13/2021 1116   No results found for: "CHOL", "HDL", "LDLCALC", "LDLDIRECT", "TRIG", "CHOLHDL" Lab Results  Component Value Date   HGBA1C 6.1 (H) 09/25/2020   Lab Results  Component Value Date   VITAMINB12 511 09/26/2020   Lab Results  Component Value Date   TSH 1.335 10/10/2020    05/30/21 MRI brain [I reviewed images myself and agree with interpretation. -VRP]  1. No acute intracranial pathology. 2. 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 No other evidence of prior infarct. 3. Global parenchymal volume loss and chronic white matter microangiopathy.  05/30/21 MRI cervical spine [I reviewed images myself and agree with interpretation. -VRP]  - Multilevel degenerative changes throughout the cervical spine as above resulting in moderate to severe bilateral neural foraminal stenosis at C3-C4, moderate spinal canal stenosis and severe bilateral neural foraminal stenosis at C4-C5 and C5-C6, and mild-to-moderate spinal canal stenosis and severe left and moderate right neural foraminal stenosis at C6-C7.   ASSESSMENT AND PLAN  72 y.o. year old female here with:  Dx:  1. Parkinson's disease without dyskinesia, with fluctuating manifestations     PLAN:  Parkinson's disease (since ~2019) - continue carb/levo (increase to 1 tab 4x per day) - fall precautions reviewed; continue PT / OT / ST eval and exercises  Meds ordered this encounter  Medications    carbidopa-levodopa (SINEMET IR) 25-100 MG tablet    Sig: Take 1 tablet by mouth 4 (four) times daily.    Dispense:  360 tablet    Refill:  4   Return in about 9 months (around 02/21/2023).    Suanne Marker, MD 05/22/2022, 11:53 AM Certified in Neurology, Neurophysiology and Neuroimaging  [facility] [address] [place], [place] [address] [phone]

## 2022-05-27 DIAGNOSIS — E1159 Type 2 diabetes mellitus with other circulatory complications: Secondary | ICD-10-CM | POA: Diagnosis not present

## 2022-05-27 DIAGNOSIS — F339 Major depressive disorder, recurrent, unspecified: Secondary | ICD-10-CM | POA: Diagnosis not present

## 2022-05-27 DIAGNOSIS — F0283 Dementia in other diseases classified elsewhere, unspecified severity, with mood disturbance: Secondary | ICD-10-CM | POA: Diagnosis not present

## 2022-05-27 DIAGNOSIS — G20A1 Parkinson's disease without dyskinesia, without mention of fluctuations: Secondary | ICD-10-CM | POA: Diagnosis not present

## 2022-05-28 DIAGNOSIS — F0283 Dementia in other diseases classified elsewhere, unspecified severity, with mood disturbance: Secondary | ICD-10-CM | POA: Diagnosis not present

## 2022-05-28 DIAGNOSIS — G20A1 Parkinson's disease without dyskinesia, without mention of fluctuations: Secondary | ICD-10-CM | POA: Diagnosis not present

## 2022-05-28 DIAGNOSIS — E1159 Type 2 diabetes mellitus with other circulatory complications: Secondary | ICD-10-CM | POA: Diagnosis not present

## 2022-05-28 DIAGNOSIS — Z6829 Body mass index (BMI) 29.0-29.9, adult: Secondary | ICD-10-CM | POA: Diagnosis not present

## 2022-05-28 DIAGNOSIS — Z9181 History of falling: Secondary | ICD-10-CM | POA: Diagnosis not present

## 2022-05-28 DIAGNOSIS — E440 Moderate protein-calorie malnutrition: Secondary | ICD-10-CM | POA: Diagnosis not present

## 2022-05-28 DIAGNOSIS — M48061 Spinal stenosis, lumbar region without neurogenic claudication: Secondary | ICD-10-CM | POA: Diagnosis not present

## 2022-05-28 DIAGNOSIS — Z8673 Personal history of transient ischemic attack (TIA), and cerebral infarction without residual deficits: Secondary | ICD-10-CM | POA: Diagnosis not present

## 2022-05-28 DIAGNOSIS — Z87891 Personal history of nicotine dependence: Secondary | ICD-10-CM | POA: Diagnosis not present

## 2022-05-28 DIAGNOSIS — I152 Hypertension secondary to endocrine disorders: Secondary | ICD-10-CM | POA: Diagnosis not present

## 2022-05-28 DIAGNOSIS — E669 Obesity, unspecified: Secondary | ICD-10-CM | POA: Diagnosis not present

## 2022-05-28 DIAGNOSIS — F339 Major depressive disorder, recurrent, unspecified: Secondary | ICD-10-CM | POA: Diagnosis not present

## 2022-05-28 DIAGNOSIS — F0284 Dementia in other diseases classified elsewhere, unspecified severity, with anxiety: Secondary | ICD-10-CM | POA: Diagnosis not present

## 2022-05-30 DIAGNOSIS — Z6829 Body mass index (BMI) 29.0-29.9, adult: Secondary | ICD-10-CM | POA: Diagnosis not present

## 2022-05-30 DIAGNOSIS — F0283 Dementia in other diseases classified elsewhere, unspecified severity, with mood disturbance: Secondary | ICD-10-CM | POA: Diagnosis not present

## 2022-05-30 DIAGNOSIS — F339 Major depressive disorder, recurrent, unspecified: Secondary | ICD-10-CM | POA: Diagnosis not present

## 2022-05-30 DIAGNOSIS — E1159 Type 2 diabetes mellitus with other circulatory complications: Secondary | ICD-10-CM | POA: Diagnosis not present

## 2022-05-30 DIAGNOSIS — M48061 Spinal stenosis, lumbar region without neurogenic claudication: Secondary | ICD-10-CM | POA: Diagnosis not present

## 2022-05-30 DIAGNOSIS — I152 Hypertension secondary to endocrine disorders: Secondary | ICD-10-CM | POA: Diagnosis not present

## 2022-05-30 DIAGNOSIS — G20A1 Parkinson's disease without dyskinesia, without mention of fluctuations: Secondary | ICD-10-CM | POA: Diagnosis not present

## 2022-05-30 DIAGNOSIS — Z9181 History of falling: Secondary | ICD-10-CM | POA: Diagnosis not present

## 2022-05-30 DIAGNOSIS — Z8673 Personal history of transient ischemic attack (TIA), and cerebral infarction without residual deficits: Secondary | ICD-10-CM | POA: Diagnosis not present

## 2022-05-30 DIAGNOSIS — E669 Obesity, unspecified: Secondary | ICD-10-CM | POA: Diagnosis not present

## 2022-05-30 DIAGNOSIS — Z87891 Personal history of nicotine dependence: Secondary | ICD-10-CM | POA: Diagnosis not present

## 2022-05-30 DIAGNOSIS — F0284 Dementia in other diseases classified elsewhere, unspecified severity, with anxiety: Secondary | ICD-10-CM | POA: Diagnosis not present

## 2022-05-30 DIAGNOSIS — E440 Moderate protein-calorie malnutrition: Secondary | ICD-10-CM | POA: Diagnosis not present

## 2022-06-03 DIAGNOSIS — Z6829 Body mass index (BMI) 29.0-29.9, adult: Secondary | ICD-10-CM | POA: Diagnosis not present

## 2022-06-03 DIAGNOSIS — Z87891 Personal history of nicotine dependence: Secondary | ICD-10-CM | POA: Diagnosis not present

## 2022-06-03 DIAGNOSIS — F339 Major depressive disorder, recurrent, unspecified: Secondary | ICD-10-CM | POA: Diagnosis not present

## 2022-06-03 DIAGNOSIS — M48061 Spinal stenosis, lumbar region without neurogenic claudication: Secondary | ICD-10-CM | POA: Diagnosis not present

## 2022-06-03 DIAGNOSIS — F0283 Dementia in other diseases classified elsewhere, unspecified severity, with mood disturbance: Secondary | ICD-10-CM | POA: Diagnosis not present

## 2022-06-03 DIAGNOSIS — Z9181 History of falling: Secondary | ICD-10-CM | POA: Diagnosis not present

## 2022-06-03 DIAGNOSIS — Z8673 Personal history of transient ischemic attack (TIA), and cerebral infarction without residual deficits: Secondary | ICD-10-CM | POA: Diagnosis not present

## 2022-06-03 DIAGNOSIS — E440 Moderate protein-calorie malnutrition: Secondary | ICD-10-CM | POA: Diagnosis not present

## 2022-06-03 DIAGNOSIS — E669 Obesity, unspecified: Secondary | ICD-10-CM | POA: Diagnosis not present

## 2022-06-03 DIAGNOSIS — I152 Hypertension secondary to endocrine disorders: Secondary | ICD-10-CM | POA: Diagnosis not present

## 2022-06-03 DIAGNOSIS — F0284 Dementia in other diseases classified elsewhere, unspecified severity, with anxiety: Secondary | ICD-10-CM | POA: Diagnosis not present

## 2022-06-03 DIAGNOSIS — G20A1 Parkinson's disease without dyskinesia, without mention of fluctuations: Secondary | ICD-10-CM | POA: Diagnosis not present

## 2022-06-03 DIAGNOSIS — E1159 Type 2 diabetes mellitus with other circulatory complications: Secondary | ICD-10-CM | POA: Diagnosis not present

## 2022-06-05 DIAGNOSIS — Z87891 Personal history of nicotine dependence: Secondary | ICD-10-CM | POA: Diagnosis not present

## 2022-06-05 DIAGNOSIS — I10 Essential (primary) hypertension: Secondary | ICD-10-CM | POA: Diagnosis not present

## 2022-06-05 DIAGNOSIS — F1120 Opioid dependence, uncomplicated: Secondary | ICD-10-CM | POA: Diagnosis not present

## 2022-06-05 DIAGNOSIS — S81812A Laceration without foreign body, left lower leg, initial encounter: Secondary | ICD-10-CM | POA: Diagnosis not present

## 2022-06-05 DIAGNOSIS — G8929 Other chronic pain: Secondary | ICD-10-CM | POA: Diagnosis not present

## 2022-06-05 DIAGNOSIS — M7989 Other specified soft tissue disorders: Secondary | ICD-10-CM | POA: Diagnosis not present

## 2022-06-05 DIAGNOSIS — E119 Type 2 diabetes mellitus without complications: Secondary | ICD-10-CM | POA: Diagnosis not present

## 2022-06-05 DIAGNOSIS — Z79899 Other long term (current) drug therapy: Secondary | ICD-10-CM | POA: Diagnosis not present

## 2022-06-05 DIAGNOSIS — W19XXXA Unspecified fall, initial encounter: Secondary | ICD-10-CM | POA: Diagnosis not present

## 2022-06-05 DIAGNOSIS — W1839XA Other fall on same level, initial encounter: Secondary | ICD-10-CM | POA: Diagnosis not present

## 2022-06-05 DIAGNOSIS — M549 Dorsalgia, unspecified: Secondary | ICD-10-CM | POA: Diagnosis not present

## 2022-06-05 DIAGNOSIS — Z743 Need for continuous supervision: Secondary | ICD-10-CM | POA: Diagnosis not present

## 2022-06-05 DIAGNOSIS — Z6832 Body mass index (BMI) 32.0-32.9, adult: Secondary | ICD-10-CM | POA: Diagnosis not present

## 2022-06-05 DIAGNOSIS — S8990XA Unspecified injury of unspecified lower leg, initial encounter: Secondary | ICD-10-CM | POA: Diagnosis not present

## 2022-06-07 DIAGNOSIS — F0283 Dementia in other diseases classified elsewhere, unspecified severity, with mood disturbance: Secondary | ICD-10-CM | POA: Diagnosis not present

## 2022-06-07 DIAGNOSIS — E440 Moderate protein-calorie malnutrition: Secondary | ICD-10-CM | POA: Diagnosis not present

## 2022-06-07 DIAGNOSIS — F339 Major depressive disorder, recurrent, unspecified: Secondary | ICD-10-CM | POA: Diagnosis not present

## 2022-06-07 DIAGNOSIS — Z9181 History of falling: Secondary | ICD-10-CM | POA: Diagnosis not present

## 2022-06-07 DIAGNOSIS — G20A1 Parkinson's disease without dyskinesia, without mention of fluctuations: Secondary | ICD-10-CM | POA: Diagnosis not present

## 2022-06-07 DIAGNOSIS — I152 Hypertension secondary to endocrine disorders: Secondary | ICD-10-CM | POA: Diagnosis not present

## 2022-06-07 DIAGNOSIS — Z6829 Body mass index (BMI) 29.0-29.9, adult: Secondary | ICD-10-CM | POA: Diagnosis not present

## 2022-06-07 DIAGNOSIS — Z8673 Personal history of transient ischemic attack (TIA), and cerebral infarction without residual deficits: Secondary | ICD-10-CM | POA: Diagnosis not present

## 2022-06-07 DIAGNOSIS — M48061 Spinal stenosis, lumbar region without neurogenic claudication: Secondary | ICD-10-CM | POA: Diagnosis not present

## 2022-06-07 DIAGNOSIS — F0284 Dementia in other diseases classified elsewhere, unspecified severity, with anxiety: Secondary | ICD-10-CM | POA: Diagnosis not present

## 2022-06-07 DIAGNOSIS — E1159 Type 2 diabetes mellitus with other circulatory complications: Secondary | ICD-10-CM | POA: Diagnosis not present

## 2022-06-07 DIAGNOSIS — E669 Obesity, unspecified: Secondary | ICD-10-CM | POA: Diagnosis not present

## 2022-06-07 DIAGNOSIS — Z87891 Personal history of nicotine dependence: Secondary | ICD-10-CM | POA: Diagnosis not present

## 2022-06-10 DIAGNOSIS — Z8673 Personal history of transient ischemic attack (TIA), and cerebral infarction without residual deficits: Secondary | ICD-10-CM | POA: Diagnosis not present

## 2022-06-10 DIAGNOSIS — E440 Moderate protein-calorie malnutrition: Secondary | ICD-10-CM | POA: Diagnosis not present

## 2022-06-10 DIAGNOSIS — F339 Major depressive disorder, recurrent, unspecified: Secondary | ICD-10-CM | POA: Diagnosis not present

## 2022-06-10 DIAGNOSIS — G20A1 Parkinson's disease without dyskinesia, without mention of fluctuations: Secondary | ICD-10-CM | POA: Diagnosis not present

## 2022-06-10 DIAGNOSIS — Z6829 Body mass index (BMI) 29.0-29.9, adult: Secondary | ICD-10-CM | POA: Diagnosis not present

## 2022-06-10 DIAGNOSIS — E1159 Type 2 diabetes mellitus with other circulatory complications: Secondary | ICD-10-CM | POA: Diagnosis not present

## 2022-06-10 DIAGNOSIS — F0283 Dementia in other diseases classified elsewhere, unspecified severity, with mood disturbance: Secondary | ICD-10-CM | POA: Diagnosis not present

## 2022-06-10 DIAGNOSIS — M48061 Spinal stenosis, lumbar region without neurogenic claudication: Secondary | ICD-10-CM | POA: Diagnosis not present

## 2022-06-10 DIAGNOSIS — F0284 Dementia in other diseases classified elsewhere, unspecified severity, with anxiety: Secondary | ICD-10-CM | POA: Diagnosis not present

## 2022-06-10 DIAGNOSIS — I152 Hypertension secondary to endocrine disorders: Secondary | ICD-10-CM | POA: Diagnosis not present

## 2022-06-10 DIAGNOSIS — Z9181 History of falling: Secondary | ICD-10-CM | POA: Diagnosis not present

## 2022-06-10 DIAGNOSIS — E669 Obesity, unspecified: Secondary | ICD-10-CM | POA: Diagnosis not present

## 2022-06-10 DIAGNOSIS — Z87891 Personal history of nicotine dependence: Secondary | ICD-10-CM | POA: Diagnosis not present

## 2022-06-12 DIAGNOSIS — Z8673 Personal history of transient ischemic attack (TIA), and cerebral infarction without residual deficits: Secondary | ICD-10-CM | POA: Diagnosis not present

## 2022-06-12 DIAGNOSIS — F0283 Dementia in other diseases classified elsewhere, unspecified severity, with mood disturbance: Secondary | ICD-10-CM | POA: Diagnosis not present

## 2022-06-12 DIAGNOSIS — I152 Hypertension secondary to endocrine disorders: Secondary | ICD-10-CM | POA: Diagnosis not present

## 2022-06-12 DIAGNOSIS — F339 Major depressive disorder, recurrent, unspecified: Secondary | ICD-10-CM | POA: Diagnosis not present

## 2022-06-12 DIAGNOSIS — G20A1 Parkinson's disease without dyskinesia, without mention of fluctuations: Secondary | ICD-10-CM | POA: Diagnosis not present

## 2022-06-12 DIAGNOSIS — E440 Moderate protein-calorie malnutrition: Secondary | ICD-10-CM | POA: Diagnosis not present

## 2022-06-12 DIAGNOSIS — Z87891 Personal history of nicotine dependence: Secondary | ICD-10-CM | POA: Diagnosis not present

## 2022-06-12 DIAGNOSIS — E1159 Type 2 diabetes mellitus with other circulatory complications: Secondary | ICD-10-CM | POA: Diagnosis not present

## 2022-06-12 DIAGNOSIS — F0284 Dementia in other diseases classified elsewhere, unspecified severity, with anxiety: Secondary | ICD-10-CM | POA: Diagnosis not present

## 2022-06-12 DIAGNOSIS — Z9181 History of falling: Secondary | ICD-10-CM | POA: Diagnosis not present

## 2022-06-12 DIAGNOSIS — Z6829 Body mass index (BMI) 29.0-29.9, adult: Secondary | ICD-10-CM | POA: Diagnosis not present

## 2022-06-12 DIAGNOSIS — E669 Obesity, unspecified: Secondary | ICD-10-CM | POA: Diagnosis not present

## 2022-06-12 DIAGNOSIS — M48061 Spinal stenosis, lumbar region without neurogenic claudication: Secondary | ICD-10-CM | POA: Diagnosis not present

## 2022-06-14 DIAGNOSIS — I152 Hypertension secondary to endocrine disorders: Secondary | ICD-10-CM | POA: Diagnosis not present

## 2022-06-14 DIAGNOSIS — I10 Essential (primary) hypertension: Secondary | ICD-10-CM | POA: Diagnosis not present

## 2022-06-14 DIAGNOSIS — E1159 Type 2 diabetes mellitus with other circulatory complications: Secondary | ICD-10-CM | POA: Diagnosis not present

## 2022-06-14 DIAGNOSIS — Z299 Encounter for prophylactic measures, unspecified: Secondary | ICD-10-CM | POA: Diagnosis not present

## 2022-06-14 DIAGNOSIS — F0390 Unspecified dementia without behavioral disturbance: Secondary | ICD-10-CM | POA: Diagnosis not present

## 2022-06-14 DIAGNOSIS — G20A1 Parkinson's disease without dyskinesia, without mention of fluctuations: Secondary | ICD-10-CM | POA: Diagnosis not present

## 2022-06-17 ENCOUNTER — Telehealth: Payer: Self-pay | Admitting: Diagnostic Neuroimaging

## 2022-06-17 DIAGNOSIS — Z8673 Personal history of transient ischemic attack (TIA), and cerebral infarction without residual deficits: Secondary | ICD-10-CM | POA: Diagnosis not present

## 2022-06-17 DIAGNOSIS — F339 Major depressive disorder, recurrent, unspecified: Secondary | ICD-10-CM | POA: Diagnosis not present

## 2022-06-17 DIAGNOSIS — Z9181 History of falling: Secondary | ICD-10-CM | POA: Diagnosis not present

## 2022-06-17 DIAGNOSIS — E1159 Type 2 diabetes mellitus with other circulatory complications: Secondary | ICD-10-CM | POA: Diagnosis not present

## 2022-06-17 DIAGNOSIS — M48061 Spinal stenosis, lumbar region without neurogenic claudication: Secondary | ICD-10-CM | POA: Diagnosis not present

## 2022-06-17 DIAGNOSIS — G20A1 Parkinson's disease without dyskinesia, without mention of fluctuations: Secondary | ICD-10-CM | POA: Diagnosis not present

## 2022-06-17 DIAGNOSIS — E669 Obesity, unspecified: Secondary | ICD-10-CM | POA: Diagnosis not present

## 2022-06-17 DIAGNOSIS — I152 Hypertension secondary to endocrine disorders: Secondary | ICD-10-CM | POA: Diagnosis not present

## 2022-06-17 DIAGNOSIS — Z6829 Body mass index (BMI) 29.0-29.9, adult: Secondary | ICD-10-CM | POA: Diagnosis not present

## 2022-06-17 DIAGNOSIS — Z87891 Personal history of nicotine dependence: Secondary | ICD-10-CM | POA: Diagnosis not present

## 2022-06-17 DIAGNOSIS — F0284 Dementia in other diseases classified elsewhere, unspecified severity, with anxiety: Secondary | ICD-10-CM | POA: Diagnosis not present

## 2022-06-17 DIAGNOSIS — F0283 Dementia in other diseases classified elsewhere, unspecified severity, with mood disturbance: Secondary | ICD-10-CM | POA: Diagnosis not present

## 2022-06-17 DIAGNOSIS — E440 Moderate protein-calorie malnutrition: Secondary | ICD-10-CM | POA: Diagnosis not present

## 2022-06-17 MED ORDER — MEMANTINE HCL 10 MG PO TABS: 10.0000 mg | ORAL_TABLET | Freq: Two times a day (BID) | ORAL | 12 refills | Status: AC

## 2022-06-17 MED ORDER — RASAGILINE MESYLATE 0.5 MG PO TABS: 0.5000 mg | ORAL_TABLET | Freq: Every day | ORAL | 5 refills | Status: DC

## 2022-06-17 NOTE — Telephone Encounter (Signed)
Contacted pt granddaughter Huntley Dec back, she stated patient has declined. She is more forgetful, she thinks she is in their old house in [place], repetitive with questions she has already answered, tremor in hands have worsen as well as gait difficulty. None of her symptoms have improved since prior visit 4/17. She is continuing PT/OT but still having gait impairment.  She is currently taking Carbidopa-Levo 25-100MG  6am, 10am, 2pm and 6 pm. Huntley Dec stated the medication can help but only for a moment, it does not help for longer than a hr or two and is very inconsistent. What do you suggest to help with patient memory and tremor?

## 2022-06-17 NOTE — Telephone Encounter (Signed)
Pt's granddaughter is asking for a call to discuss Parkinson's medication, she believes pt needs a stronger dose.

## 2022-06-17 NOTE — Addendum Note (Signed)
Addended by: Joycelyn Schmid R on: 06/17/2022 03:18 PM   Modules accepted: Orders

## 2022-06-17 NOTE — Telephone Encounter (Signed)
Add rasagiline 0.5mg  daily (to help extend carb / levo).   Add memantine 10mg  twice a day (for memory; this is a difficult symptom to manage, and will continue to progress over time).   Meds ordered this encounter  Medications   rasagiline (AZILECT) 0.5 MG TABS tablet    Sig: Take 1 tablet (0.5 mg total) by mouth daily.    Dispense:  30 tablet    Refill:  5   memantine (NAMENDA) 10 MG tablet    Sig: Take 1 tablet (10 mg total) by mouth 2 (two) times daily.    Dispense:  60 tablet    Refill:  12   Suanne Marker, MD 06/17/2022, 3:18 PM Certified in Neurology, Neurophysiology and Neuroimaging  [facility] [address], [address] [place], [place] [address] [phone]

## 2022-06-17 NOTE — Telephone Encounter (Signed)
Contacted pt granddaughter back, informed her of MD recommendations to Add rasagiline 0.5mg  daily (to help extend carb / levo). Add memantine 10mg  twice a day. Informed Memantine will only help slow progression of memory lost but unfortunately will not cure it. She verbally understood and was appreciative for cal back. Advised to call office back with questions or concerns as she had none at this time and was appreciative.

## 2022-06-18 DIAGNOSIS — Z9181 History of falling: Secondary | ICD-10-CM | POA: Diagnosis not present

## 2022-06-18 DIAGNOSIS — Z6829 Body mass index (BMI) 29.0-29.9, adult: Secondary | ICD-10-CM | POA: Diagnosis not present

## 2022-06-18 DIAGNOSIS — I152 Hypertension secondary to endocrine disorders: Secondary | ICD-10-CM | POA: Diagnosis not present

## 2022-06-18 DIAGNOSIS — F339 Major depressive disorder, recurrent, unspecified: Secondary | ICD-10-CM | POA: Diagnosis not present

## 2022-06-18 DIAGNOSIS — E440 Moderate protein-calorie malnutrition: Secondary | ICD-10-CM | POA: Diagnosis not present

## 2022-06-18 DIAGNOSIS — F0283 Dementia in other diseases classified elsewhere, unspecified severity, with mood disturbance: Secondary | ICD-10-CM | POA: Diagnosis not present

## 2022-06-18 DIAGNOSIS — Z87891 Personal history of nicotine dependence: Secondary | ICD-10-CM | POA: Diagnosis not present

## 2022-06-18 DIAGNOSIS — G20A1 Parkinson's disease without dyskinesia, without mention of fluctuations: Secondary | ICD-10-CM | POA: Diagnosis not present

## 2022-06-18 DIAGNOSIS — E1159 Type 2 diabetes mellitus with other circulatory complications: Secondary | ICD-10-CM | POA: Diagnosis not present

## 2022-06-18 DIAGNOSIS — F0284 Dementia in other diseases classified elsewhere, unspecified severity, with anxiety: Secondary | ICD-10-CM | POA: Diagnosis not present

## 2022-06-18 DIAGNOSIS — Z8673 Personal history of transient ischemic attack (TIA), and cerebral infarction without residual deficits: Secondary | ICD-10-CM | POA: Diagnosis not present

## 2022-06-18 DIAGNOSIS — M48061 Spinal stenosis, lumbar region without neurogenic claudication: Secondary | ICD-10-CM | POA: Diagnosis not present

## 2022-06-18 DIAGNOSIS — E669 Obesity, unspecified: Secondary | ICD-10-CM | POA: Diagnosis not present

## 2022-06-18 MED ORDER — AMANTADINE HCL 100 MG PO CAPS: 100.0000 mg | ORAL_CAPSULE | Freq: Two times a day (BID) | ORAL | 12 refills | Status: AC

## 2022-06-18 NOTE — Addendum Note (Signed)
Addended by: Joycelyn Schmid R on: 06/18/2022 05:15 PM   Modules accepted: Orders

## 2022-06-18 NOTE — Telephone Encounter (Signed)
At 12 noon Summer from [facility] left a vm re: the new Rx for pt's rasagiline (AZILECT) 0.5 MG TABS tablet.  Summer stated pt is getting DULoxetine (CYMBALTA) 30 MG capsule from another provider.  Summer is asking if Dr here told her to stop this medication or not so they can update her profile.  Summer can be reached at [phone]

## 2022-06-18 NOTE — Telephone Encounter (Signed)
Stop rasagiline. Can try amantadine 100mg  twice a day.  Meds ordered this encounter  Medications   DISCONTD: rasagiline (AZILECT) 0.5 MG TABS tablet    Sig: Take 1 tablet (0.5 mg total) by mouth daily.    Dispense:  30 tablet    Refill:  5   memantine (NAMENDA) 10 MG tablet    Sig: Take 1 tablet (10 mg total) by mouth 2 (two) times daily.    Dispense:  60 tablet    Refill:  12   amantadine (SYMMETREL) 100 MG capsule    Sig: Take 1 capsule (100 mg total) by mouth 2 (two) times daily.    Dispense:  60 capsule    Refill:  12   Suanne Marker, MD 06/18/2022, 5:14 PM Certified in Neurology, Neurophysiology and Neuroimaging  [facility] [address], [address] [place], [place] [address] [phone]

## 2022-06-18 NOTE — Telephone Encounter (Signed)
Cymbalta and Rasagiline are contraindicated. Do you want her to D/C one ?

## 2022-06-19 NOTE — Telephone Encounter (Signed)
Granddaughter notified of change.

## 2022-06-20 ENCOUNTER — Telehealth: Payer: Self-pay | Admitting: Diagnostic Neuroimaging

## 2022-06-20 NOTE — Telephone Encounter (Signed)
Was already addressed in TE 5/13, I spoke with granddaughter. Please advise them of change, MD discontinued Rasagiline and added amantadine.

## 2022-06-20 NOTE — Telephone Encounter (Signed)
Summer from [facility] is asking for a call to discuss rasagiline, and it interacting with DULoxetine (CYMBALTA) 30 MG capsule which is being prescribed by another Dr.  She is asking for a call to discuss.

## 2022-06-20 NOTE — Telephone Encounter (Signed)
Called French Ana at [facility]. Informed her of message Sheryle Hail, [place] sent. She stated I will get Rasagiline discharged and add amantadine.

## 2022-06-21 DIAGNOSIS — M48061 Spinal stenosis, lumbar region without neurogenic claudication: Secondary | ICD-10-CM | POA: Diagnosis not present

## 2022-06-21 DIAGNOSIS — G20A1 Parkinson's disease without dyskinesia, without mention of fluctuations: Secondary | ICD-10-CM | POA: Diagnosis not present

## 2022-06-21 DIAGNOSIS — F0284 Dementia in other diseases classified elsewhere, unspecified severity, with anxiety: Secondary | ICD-10-CM | POA: Diagnosis not present

## 2022-06-21 DIAGNOSIS — F339 Major depressive disorder, recurrent, unspecified: Secondary | ICD-10-CM | POA: Diagnosis not present

## 2022-06-21 DIAGNOSIS — E669 Obesity, unspecified: Secondary | ICD-10-CM | POA: Diagnosis not present

## 2022-06-21 DIAGNOSIS — Z6829 Body mass index (BMI) 29.0-29.9, adult: Secondary | ICD-10-CM | POA: Diagnosis not present

## 2022-06-21 DIAGNOSIS — Z8673 Personal history of transient ischemic attack (TIA), and cerebral infarction without residual deficits: Secondary | ICD-10-CM | POA: Diagnosis not present

## 2022-06-21 DIAGNOSIS — Z9181 History of falling: Secondary | ICD-10-CM | POA: Diagnosis not present

## 2022-06-21 DIAGNOSIS — F0283 Dementia in other diseases classified elsewhere, unspecified severity, with mood disturbance: Secondary | ICD-10-CM | POA: Diagnosis not present

## 2022-06-21 DIAGNOSIS — Z87891 Personal history of nicotine dependence: Secondary | ICD-10-CM | POA: Diagnosis not present

## 2022-06-21 DIAGNOSIS — I152 Hypertension secondary to endocrine disorders: Secondary | ICD-10-CM | POA: Diagnosis not present

## 2022-06-21 DIAGNOSIS — E1159 Type 2 diabetes mellitus with other circulatory complications: Secondary | ICD-10-CM | POA: Diagnosis not present

## 2022-06-21 DIAGNOSIS — E440 Moderate protein-calorie malnutrition: Secondary | ICD-10-CM | POA: Diagnosis not present

## 2022-06-24 DIAGNOSIS — F0284 Dementia in other diseases classified elsewhere, unspecified severity, with anxiety: Secondary | ICD-10-CM | POA: Diagnosis not present

## 2022-06-24 DIAGNOSIS — Z8673 Personal history of transient ischemic attack (TIA), and cerebral infarction without residual deficits: Secondary | ICD-10-CM | POA: Diagnosis not present

## 2022-06-24 DIAGNOSIS — G20A1 Parkinson's disease without dyskinesia, without mention of fluctuations: Secondary | ICD-10-CM | POA: Diagnosis not present

## 2022-06-24 DIAGNOSIS — M48061 Spinal stenosis, lumbar region without neurogenic claudication: Secondary | ICD-10-CM | POA: Diagnosis not present

## 2022-06-24 DIAGNOSIS — I152 Hypertension secondary to endocrine disorders: Secondary | ICD-10-CM | POA: Diagnosis not present

## 2022-06-24 DIAGNOSIS — F0283 Dementia in other diseases classified elsewhere, unspecified severity, with mood disturbance: Secondary | ICD-10-CM | POA: Diagnosis not present

## 2022-06-24 DIAGNOSIS — Z9181 History of falling: Secondary | ICD-10-CM | POA: Diagnosis not present

## 2022-06-24 DIAGNOSIS — E1159 Type 2 diabetes mellitus with other circulatory complications: Secondary | ICD-10-CM | POA: Diagnosis not present

## 2022-06-24 DIAGNOSIS — F339 Major depressive disorder, recurrent, unspecified: Secondary | ICD-10-CM | POA: Diagnosis not present

## 2022-06-24 DIAGNOSIS — Z6829 Body mass index (BMI) 29.0-29.9, adult: Secondary | ICD-10-CM | POA: Diagnosis not present

## 2022-06-24 DIAGNOSIS — E440 Moderate protein-calorie malnutrition: Secondary | ICD-10-CM | POA: Diagnosis not present

## 2022-06-24 DIAGNOSIS — E669 Obesity, unspecified: Secondary | ICD-10-CM | POA: Diagnosis not present

## 2022-06-24 DIAGNOSIS — Z87891 Personal history of nicotine dependence: Secondary | ICD-10-CM | POA: Diagnosis not present

## 2022-06-26 DIAGNOSIS — E440 Moderate protein-calorie malnutrition: Secondary | ICD-10-CM | POA: Diagnosis not present

## 2022-06-26 DIAGNOSIS — G20A1 Parkinson's disease without dyskinesia, without mention of fluctuations: Secondary | ICD-10-CM | POA: Diagnosis not present

## 2022-06-26 DIAGNOSIS — Z6829 Body mass index (BMI) 29.0-29.9, adult: Secondary | ICD-10-CM | POA: Diagnosis not present

## 2022-06-26 DIAGNOSIS — M48061 Spinal stenosis, lumbar region without neurogenic claudication: Secondary | ICD-10-CM | POA: Diagnosis not present

## 2022-06-26 DIAGNOSIS — E669 Obesity, unspecified: Secondary | ICD-10-CM | POA: Diagnosis not present

## 2022-06-26 DIAGNOSIS — F0283 Dementia in other diseases classified elsewhere, unspecified severity, with mood disturbance: Secondary | ICD-10-CM | POA: Diagnosis not present

## 2022-06-26 DIAGNOSIS — Z9181 History of falling: Secondary | ICD-10-CM | POA: Diagnosis not present

## 2022-06-26 DIAGNOSIS — F0284 Dementia in other diseases classified elsewhere, unspecified severity, with anxiety: Secondary | ICD-10-CM | POA: Diagnosis not present

## 2022-06-26 DIAGNOSIS — Z87891 Personal history of nicotine dependence: Secondary | ICD-10-CM | POA: Diagnosis not present

## 2022-06-26 DIAGNOSIS — Z8673 Personal history of transient ischemic attack (TIA), and cerebral infarction without residual deficits: Secondary | ICD-10-CM | POA: Diagnosis not present

## 2022-06-26 DIAGNOSIS — E1159 Type 2 diabetes mellitus with other circulatory complications: Secondary | ICD-10-CM | POA: Diagnosis not present

## 2022-06-26 DIAGNOSIS — F339 Major depressive disorder, recurrent, unspecified: Secondary | ICD-10-CM | POA: Diagnosis not present

## 2022-06-26 DIAGNOSIS — I152 Hypertension secondary to endocrine disorders: Secondary | ICD-10-CM | POA: Diagnosis not present

## 2022-06-27 DIAGNOSIS — M48061 Spinal stenosis, lumbar region without neurogenic claudication: Secondary | ICD-10-CM | POA: Diagnosis not present

## 2022-06-27 DIAGNOSIS — E1159 Type 2 diabetes mellitus with other circulatory complications: Secondary | ICD-10-CM | POA: Diagnosis not present

## 2022-06-27 DIAGNOSIS — F0284 Dementia in other diseases classified elsewhere, unspecified severity, with anxiety: Secondary | ICD-10-CM | POA: Diagnosis not present

## 2022-06-27 DIAGNOSIS — Z8673 Personal history of transient ischemic attack (TIA), and cerebral infarction without residual deficits: Secondary | ICD-10-CM | POA: Diagnosis not present

## 2022-06-27 DIAGNOSIS — Z87891 Personal history of nicotine dependence: Secondary | ICD-10-CM | POA: Diagnosis not present

## 2022-06-27 DIAGNOSIS — F0283 Dementia in other diseases classified elsewhere, unspecified severity, with mood disturbance: Secondary | ICD-10-CM | POA: Diagnosis not present

## 2022-06-27 DIAGNOSIS — I152 Hypertension secondary to endocrine disorders: Secondary | ICD-10-CM | POA: Diagnosis not present

## 2022-06-27 DIAGNOSIS — F339 Major depressive disorder, recurrent, unspecified: Secondary | ICD-10-CM | POA: Diagnosis not present

## 2022-06-27 DIAGNOSIS — Z6829 Body mass index (BMI) 29.0-29.9, adult: Secondary | ICD-10-CM | POA: Diagnosis not present

## 2022-06-27 DIAGNOSIS — Z9181 History of falling: Secondary | ICD-10-CM | POA: Diagnosis not present

## 2022-06-27 DIAGNOSIS — G20A1 Parkinson's disease without dyskinesia, without mention of fluctuations: Secondary | ICD-10-CM | POA: Diagnosis not present

## 2022-06-27 DIAGNOSIS — E440 Moderate protein-calorie malnutrition: Secondary | ICD-10-CM | POA: Diagnosis not present

## 2022-06-27 DIAGNOSIS — E669 Obesity, unspecified: Secondary | ICD-10-CM | POA: Diagnosis not present

## 2022-07-02 DIAGNOSIS — I152 Hypertension secondary to endocrine disorders: Secondary | ICD-10-CM | POA: Diagnosis not present

## 2022-07-02 DIAGNOSIS — G20A1 Parkinson's disease without dyskinesia, without mention of fluctuations: Secondary | ICD-10-CM | POA: Diagnosis not present

## 2022-07-02 DIAGNOSIS — E1159 Type 2 diabetes mellitus with other circulatory complications: Secondary | ICD-10-CM | POA: Diagnosis not present

## 2022-07-02 DIAGNOSIS — Z9181 History of falling: Secondary | ICD-10-CM | POA: Diagnosis not present

## 2022-07-02 DIAGNOSIS — Z6829 Body mass index (BMI) 29.0-29.9, adult: Secondary | ICD-10-CM | POA: Diagnosis not present

## 2022-07-02 DIAGNOSIS — E669 Obesity, unspecified: Secondary | ICD-10-CM | POA: Diagnosis not present

## 2022-07-02 DIAGNOSIS — F0283 Dementia in other diseases classified elsewhere, unspecified severity, with mood disturbance: Secondary | ICD-10-CM | POA: Diagnosis not present

## 2022-07-02 DIAGNOSIS — M48061 Spinal stenosis, lumbar region without neurogenic claudication: Secondary | ICD-10-CM | POA: Diagnosis not present

## 2022-07-02 DIAGNOSIS — Z8673 Personal history of transient ischemic attack (TIA), and cerebral infarction without residual deficits: Secondary | ICD-10-CM | POA: Diagnosis not present

## 2022-07-02 DIAGNOSIS — F339 Major depressive disorder, recurrent, unspecified: Secondary | ICD-10-CM | POA: Diagnosis not present

## 2022-07-02 DIAGNOSIS — E440 Moderate protein-calorie malnutrition: Secondary | ICD-10-CM | POA: Diagnosis not present

## 2022-07-02 DIAGNOSIS — F0284 Dementia in other diseases classified elsewhere, unspecified severity, with anxiety: Secondary | ICD-10-CM | POA: Diagnosis not present

## 2022-07-02 DIAGNOSIS — Z87891 Personal history of nicotine dependence: Secondary | ICD-10-CM | POA: Diagnosis not present

## 2022-07-04 DIAGNOSIS — E669 Obesity, unspecified: Secondary | ICD-10-CM | POA: Diagnosis not present

## 2022-07-04 DIAGNOSIS — F339 Major depressive disorder, recurrent, unspecified: Secondary | ICD-10-CM | POA: Diagnosis not present

## 2022-07-04 DIAGNOSIS — M48061 Spinal stenosis, lumbar region without neurogenic claudication: Secondary | ICD-10-CM | POA: Diagnosis not present

## 2022-07-04 DIAGNOSIS — I152 Hypertension secondary to endocrine disorders: Secondary | ICD-10-CM | POA: Diagnosis not present

## 2022-07-04 DIAGNOSIS — Z6829 Body mass index (BMI) 29.0-29.9, adult: Secondary | ICD-10-CM | POA: Diagnosis not present

## 2022-07-04 DIAGNOSIS — G20A1 Parkinson's disease without dyskinesia, without mention of fluctuations: Secondary | ICD-10-CM | POA: Diagnosis not present

## 2022-07-04 DIAGNOSIS — F0283 Dementia in other diseases classified elsewhere, unspecified severity, with mood disturbance: Secondary | ICD-10-CM | POA: Diagnosis not present

## 2022-07-04 DIAGNOSIS — E440 Moderate protein-calorie malnutrition: Secondary | ICD-10-CM | POA: Diagnosis not present

## 2022-07-04 DIAGNOSIS — F0284 Dementia in other diseases classified elsewhere, unspecified severity, with anxiety: Secondary | ICD-10-CM | POA: Diagnosis not present

## 2022-07-04 DIAGNOSIS — Z87891 Personal history of nicotine dependence: Secondary | ICD-10-CM | POA: Diagnosis not present

## 2022-07-04 DIAGNOSIS — E1159 Type 2 diabetes mellitus with other circulatory complications: Secondary | ICD-10-CM | POA: Diagnosis not present

## 2022-07-04 DIAGNOSIS — Z9181 History of falling: Secondary | ICD-10-CM | POA: Diagnosis not present

## 2022-07-04 DIAGNOSIS — Z8673 Personal history of transient ischemic attack (TIA), and cerebral infarction without residual deficits: Secondary | ICD-10-CM | POA: Diagnosis not present

## 2022-07-08 DIAGNOSIS — F339 Major depressive disorder, recurrent, unspecified: Secondary | ICD-10-CM | POA: Diagnosis not present

## 2022-07-08 DIAGNOSIS — E440 Moderate protein-calorie malnutrition: Secondary | ICD-10-CM | POA: Diagnosis not present

## 2022-07-08 DIAGNOSIS — Z8673 Personal history of transient ischemic attack (TIA), and cerebral infarction without residual deficits: Secondary | ICD-10-CM | POA: Diagnosis not present

## 2022-07-08 DIAGNOSIS — Z9181 History of falling: Secondary | ICD-10-CM | POA: Diagnosis not present

## 2022-07-08 DIAGNOSIS — Z6829 Body mass index (BMI) 29.0-29.9, adult: Secondary | ICD-10-CM | POA: Diagnosis not present

## 2022-07-08 DIAGNOSIS — G20A1 Parkinson's disease without dyskinesia, without mention of fluctuations: Secondary | ICD-10-CM | POA: Diagnosis not present

## 2022-07-08 DIAGNOSIS — E1159 Type 2 diabetes mellitus with other circulatory complications: Secondary | ICD-10-CM | POA: Diagnosis not present

## 2022-07-08 DIAGNOSIS — F0284 Dementia in other diseases classified elsewhere, unspecified severity, with anxiety: Secondary | ICD-10-CM | POA: Diagnosis not present

## 2022-07-08 DIAGNOSIS — M48061 Spinal stenosis, lumbar region without neurogenic claudication: Secondary | ICD-10-CM | POA: Diagnosis not present

## 2022-07-08 DIAGNOSIS — I152 Hypertension secondary to endocrine disorders: Secondary | ICD-10-CM | POA: Diagnosis not present

## 2022-07-08 DIAGNOSIS — Z87891 Personal history of nicotine dependence: Secondary | ICD-10-CM | POA: Diagnosis not present

## 2022-07-08 DIAGNOSIS — F0283 Dementia in other diseases classified elsewhere, unspecified severity, with mood disturbance: Secondary | ICD-10-CM | POA: Diagnosis not present

## 2022-07-08 DIAGNOSIS — E669 Obesity, unspecified: Secondary | ICD-10-CM | POA: Diagnosis not present

## 2022-07-11 DIAGNOSIS — F339 Major depressive disorder, recurrent, unspecified: Secondary | ICD-10-CM | POA: Diagnosis not present

## 2022-07-11 DIAGNOSIS — E440 Moderate protein-calorie malnutrition: Secondary | ICD-10-CM | POA: Diagnosis not present

## 2022-07-11 DIAGNOSIS — Z9181 History of falling: Secondary | ICD-10-CM | POA: Diagnosis not present

## 2022-07-11 DIAGNOSIS — I152 Hypertension secondary to endocrine disorders: Secondary | ICD-10-CM | POA: Diagnosis not present

## 2022-07-11 DIAGNOSIS — E1159 Type 2 diabetes mellitus with other circulatory complications: Secondary | ICD-10-CM | POA: Diagnosis not present

## 2022-07-11 DIAGNOSIS — Z87891 Personal history of nicotine dependence: Secondary | ICD-10-CM | POA: Diagnosis not present

## 2022-07-11 DIAGNOSIS — Z6829 Body mass index (BMI) 29.0-29.9, adult: Secondary | ICD-10-CM | POA: Diagnosis not present

## 2022-07-11 DIAGNOSIS — F0284 Dementia in other diseases classified elsewhere, unspecified severity, with anxiety: Secondary | ICD-10-CM | POA: Diagnosis not present

## 2022-07-11 DIAGNOSIS — G20A1 Parkinson's disease without dyskinesia, without mention of fluctuations: Secondary | ICD-10-CM | POA: Diagnosis not present

## 2022-07-11 DIAGNOSIS — M48061 Spinal stenosis, lumbar region without neurogenic claudication: Secondary | ICD-10-CM | POA: Diagnosis not present

## 2022-07-11 DIAGNOSIS — F0283 Dementia in other diseases classified elsewhere, unspecified severity, with mood disturbance: Secondary | ICD-10-CM | POA: Diagnosis not present

## 2022-07-11 DIAGNOSIS — Z8673 Personal history of transient ischemic attack (TIA), and cerebral infarction without residual deficits: Secondary | ICD-10-CM | POA: Diagnosis not present

## 2022-07-11 DIAGNOSIS — E669 Obesity, unspecified: Secondary | ICD-10-CM | POA: Diagnosis not present

## 2022-07-15 DIAGNOSIS — Z87891 Personal history of nicotine dependence: Secondary | ICD-10-CM | POA: Diagnosis not present

## 2022-07-15 DIAGNOSIS — E1159 Type 2 diabetes mellitus with other circulatory complications: Secondary | ICD-10-CM | POA: Diagnosis not present

## 2022-07-15 DIAGNOSIS — Z8673 Personal history of transient ischemic attack (TIA), and cerebral infarction without residual deficits: Secondary | ICD-10-CM | POA: Diagnosis not present

## 2022-07-15 DIAGNOSIS — F0283 Dementia in other diseases classified elsewhere, unspecified severity, with mood disturbance: Secondary | ICD-10-CM | POA: Diagnosis not present

## 2022-07-15 DIAGNOSIS — E440 Moderate protein-calorie malnutrition: Secondary | ICD-10-CM | POA: Diagnosis not present

## 2022-07-15 DIAGNOSIS — Z6829 Body mass index (BMI) 29.0-29.9, adult: Secondary | ICD-10-CM | POA: Diagnosis not present

## 2022-07-15 DIAGNOSIS — Z9181 History of falling: Secondary | ICD-10-CM | POA: Diagnosis not present

## 2022-07-15 DIAGNOSIS — M48061 Spinal stenosis, lumbar region without neurogenic claudication: Secondary | ICD-10-CM | POA: Diagnosis not present

## 2022-07-15 DIAGNOSIS — G20A1 Parkinson's disease without dyskinesia, without mention of fluctuations: Secondary | ICD-10-CM | POA: Diagnosis not present

## 2022-07-15 DIAGNOSIS — F0284 Dementia in other diseases classified elsewhere, unspecified severity, with anxiety: Secondary | ICD-10-CM | POA: Diagnosis not present

## 2022-07-15 DIAGNOSIS — F339 Major depressive disorder, recurrent, unspecified: Secondary | ICD-10-CM | POA: Diagnosis not present

## 2022-07-15 DIAGNOSIS — E669 Obesity, unspecified: Secondary | ICD-10-CM | POA: Diagnosis not present

## 2022-07-15 DIAGNOSIS — I152 Hypertension secondary to endocrine disorders: Secondary | ICD-10-CM | POA: Diagnosis not present

## 2022-07-18 DIAGNOSIS — Z9181 History of falling: Secondary | ICD-10-CM | POA: Diagnosis not present

## 2022-07-18 DIAGNOSIS — F339 Major depressive disorder, recurrent, unspecified: Secondary | ICD-10-CM | POA: Diagnosis not present

## 2022-07-18 DIAGNOSIS — E669 Obesity, unspecified: Secondary | ICD-10-CM | POA: Diagnosis not present

## 2022-07-18 DIAGNOSIS — Z6829 Body mass index (BMI) 29.0-29.9, adult: Secondary | ICD-10-CM | POA: Diagnosis not present

## 2022-07-18 DIAGNOSIS — Z87891 Personal history of nicotine dependence: Secondary | ICD-10-CM | POA: Diagnosis not present

## 2022-07-18 DIAGNOSIS — G20A1 Parkinson's disease without dyskinesia, without mention of fluctuations: Secondary | ICD-10-CM | POA: Diagnosis not present

## 2022-07-18 DIAGNOSIS — E440 Moderate protein-calorie malnutrition: Secondary | ICD-10-CM | POA: Diagnosis not present

## 2022-07-18 DIAGNOSIS — Z8673 Personal history of transient ischemic attack (TIA), and cerebral infarction without residual deficits: Secondary | ICD-10-CM | POA: Diagnosis not present

## 2022-07-18 DIAGNOSIS — M48061 Spinal stenosis, lumbar region without neurogenic claudication: Secondary | ICD-10-CM | POA: Diagnosis not present

## 2022-07-18 DIAGNOSIS — I152 Hypertension secondary to endocrine disorders: Secondary | ICD-10-CM | POA: Diagnosis not present

## 2022-07-18 DIAGNOSIS — F0284 Dementia in other diseases classified elsewhere, unspecified severity, with anxiety: Secondary | ICD-10-CM | POA: Diagnosis not present

## 2022-07-18 DIAGNOSIS — E1159 Type 2 diabetes mellitus with other circulatory complications: Secondary | ICD-10-CM | POA: Diagnosis not present

## 2022-07-18 DIAGNOSIS — F0283 Dementia in other diseases classified elsewhere, unspecified severity, with mood disturbance: Secondary | ICD-10-CM | POA: Diagnosis not present

## 2022-07-22 DIAGNOSIS — E440 Moderate protein-calorie malnutrition: Secondary | ICD-10-CM | POA: Diagnosis not present

## 2022-07-22 DIAGNOSIS — E1159 Type 2 diabetes mellitus with other circulatory complications: Secondary | ICD-10-CM | POA: Diagnosis not present

## 2022-07-22 DIAGNOSIS — F0284 Dementia in other diseases classified elsewhere, unspecified severity, with anxiety: Secondary | ICD-10-CM | POA: Diagnosis not present

## 2022-07-22 DIAGNOSIS — I152 Hypertension secondary to endocrine disorders: Secondary | ICD-10-CM | POA: Diagnosis not present

## 2022-07-22 DIAGNOSIS — F0283 Dementia in other diseases classified elsewhere, unspecified severity, with mood disturbance: Secondary | ICD-10-CM | POA: Diagnosis not present

## 2022-07-22 DIAGNOSIS — S81812D Laceration without foreign body, left lower leg, subsequent encounter: Secondary | ICD-10-CM | POA: Diagnosis not present

## 2022-07-22 DIAGNOSIS — F339 Major depressive disorder, recurrent, unspecified: Secondary | ICD-10-CM | POA: Diagnosis not present

## 2022-07-22 DIAGNOSIS — E669 Obesity, unspecified: Secondary | ICD-10-CM | POA: Diagnosis not present

## 2022-07-22 DIAGNOSIS — G20A1 Parkinson's disease without dyskinesia, without mention of fluctuations: Secondary | ICD-10-CM | POA: Diagnosis not present

## 2022-07-22 DIAGNOSIS — M48061 Spinal stenosis, lumbar region without neurogenic claudication: Secondary | ICD-10-CM | POA: Diagnosis not present

## 2022-07-30 DIAGNOSIS — E1159 Type 2 diabetes mellitus with other circulatory complications: Secondary | ICD-10-CM | POA: Diagnosis not present

## 2022-07-30 DIAGNOSIS — G20A1 Parkinson's disease without dyskinesia, without mention of fluctuations: Secondary | ICD-10-CM | POA: Diagnosis not present

## 2022-07-30 DIAGNOSIS — F0283 Dementia in other diseases classified elsewhere, unspecified severity, with mood disturbance: Secondary | ICD-10-CM | POA: Diagnosis not present

## 2022-07-30 DIAGNOSIS — F339 Major depressive disorder, recurrent, unspecified: Secondary | ICD-10-CM | POA: Diagnosis not present

## 2022-07-31 DIAGNOSIS — E669 Obesity, unspecified: Secondary | ICD-10-CM | POA: Diagnosis not present

## 2022-07-31 DIAGNOSIS — F339 Major depressive disorder, recurrent, unspecified: Secondary | ICD-10-CM | POA: Diagnosis not present

## 2022-07-31 DIAGNOSIS — M48061 Spinal stenosis, lumbar region without neurogenic claudication: Secondary | ICD-10-CM | POA: Diagnosis not present

## 2022-07-31 DIAGNOSIS — S81812D Laceration without foreign body, left lower leg, subsequent encounter: Secondary | ICD-10-CM | POA: Diagnosis not present

## 2022-07-31 DIAGNOSIS — F0284 Dementia in other diseases classified elsewhere, unspecified severity, with anxiety: Secondary | ICD-10-CM | POA: Diagnosis not present

## 2022-07-31 DIAGNOSIS — E440 Moderate protein-calorie malnutrition: Secondary | ICD-10-CM | POA: Diagnosis not present

## 2022-07-31 DIAGNOSIS — F0283 Dementia in other diseases classified elsewhere, unspecified severity, with mood disturbance: Secondary | ICD-10-CM | POA: Diagnosis not present

## 2022-07-31 DIAGNOSIS — G20A1 Parkinson's disease without dyskinesia, without mention of fluctuations: Secondary | ICD-10-CM | POA: Diagnosis not present

## 2022-07-31 DIAGNOSIS — I152 Hypertension secondary to endocrine disorders: Secondary | ICD-10-CM | POA: Diagnosis not present

## 2022-07-31 DIAGNOSIS — E1159 Type 2 diabetes mellitus with other circulatory complications: Secondary | ICD-10-CM | POA: Diagnosis not present

## 2022-08-06 DIAGNOSIS — M48061 Spinal stenosis, lumbar region without neurogenic claudication: Secondary | ICD-10-CM | POA: Diagnosis not present

## 2022-08-06 DIAGNOSIS — G20A1 Parkinson's disease without dyskinesia, without mention of fluctuations: Secondary | ICD-10-CM | POA: Diagnosis not present

## 2022-08-06 DIAGNOSIS — F0284 Dementia in other diseases classified elsewhere, unspecified severity, with anxiety: Secondary | ICD-10-CM | POA: Diagnosis not present

## 2022-08-06 DIAGNOSIS — E1159 Type 2 diabetes mellitus with other circulatory complications: Secondary | ICD-10-CM | POA: Diagnosis not present

## 2022-08-06 DIAGNOSIS — E669 Obesity, unspecified: Secondary | ICD-10-CM | POA: Diagnosis not present

## 2022-08-06 DIAGNOSIS — E440 Moderate protein-calorie malnutrition: Secondary | ICD-10-CM | POA: Diagnosis not present

## 2022-08-06 DIAGNOSIS — F339 Major depressive disorder, recurrent, unspecified: Secondary | ICD-10-CM | POA: Diagnosis not present

## 2022-08-06 DIAGNOSIS — F0283 Dementia in other diseases classified elsewhere, unspecified severity, with mood disturbance: Secondary | ICD-10-CM | POA: Diagnosis not present

## 2022-08-06 DIAGNOSIS — I152 Hypertension secondary to endocrine disorders: Secondary | ICD-10-CM | POA: Diagnosis not present

## 2022-08-06 DIAGNOSIS — S81812D Laceration without foreign body, left lower leg, subsequent encounter: Secondary | ICD-10-CM | POA: Diagnosis not present

## 2022-08-14 DIAGNOSIS — I152 Hypertension secondary to endocrine disorders: Secondary | ICD-10-CM | POA: Diagnosis not present

## 2022-08-14 DIAGNOSIS — F0283 Dementia in other diseases classified elsewhere, unspecified severity, with mood disturbance: Secondary | ICD-10-CM | POA: Diagnosis not present

## 2022-08-14 DIAGNOSIS — M48061 Spinal stenosis, lumbar region without neurogenic claudication: Secondary | ICD-10-CM | POA: Diagnosis not present

## 2022-08-14 DIAGNOSIS — E669 Obesity, unspecified: Secondary | ICD-10-CM | POA: Diagnosis not present

## 2022-08-14 DIAGNOSIS — F339 Major depressive disorder, recurrent, unspecified: Secondary | ICD-10-CM | POA: Diagnosis not present

## 2022-08-14 DIAGNOSIS — S81812D Laceration without foreign body, left lower leg, subsequent encounter: Secondary | ICD-10-CM | POA: Diagnosis not present

## 2022-08-14 DIAGNOSIS — E1159 Type 2 diabetes mellitus with other circulatory complications: Secondary | ICD-10-CM | POA: Diagnosis not present

## 2022-08-14 DIAGNOSIS — G20A1 Parkinson's disease without dyskinesia, without mention of fluctuations: Secondary | ICD-10-CM | POA: Diagnosis not present

## 2022-08-14 DIAGNOSIS — F0284 Dementia in other diseases classified elsewhere, unspecified severity, with anxiety: Secondary | ICD-10-CM | POA: Diagnosis not present

## 2022-08-14 DIAGNOSIS — E440 Moderate protein-calorie malnutrition: Secondary | ICD-10-CM | POA: Diagnosis not present

## 2022-08-21 DIAGNOSIS — E440 Moderate protein-calorie malnutrition: Secondary | ICD-10-CM | POA: Diagnosis not present

## 2022-08-21 DIAGNOSIS — M48061 Spinal stenosis, lumbar region without neurogenic claudication: Secondary | ICD-10-CM | POA: Diagnosis not present

## 2022-08-21 DIAGNOSIS — Z8673 Personal history of transient ischemic attack (TIA), and cerebral infarction without residual deficits: Secondary | ICD-10-CM | POA: Diagnosis not present

## 2022-08-21 DIAGNOSIS — Z6829 Body mass index (BMI) 29.0-29.9, adult: Secondary | ICD-10-CM | POA: Diagnosis not present

## 2022-08-21 DIAGNOSIS — F0283 Dementia in other diseases classified elsewhere, unspecified severity, with mood disturbance: Secondary | ICD-10-CM | POA: Diagnosis not present

## 2022-08-21 DIAGNOSIS — Z87891 Personal history of nicotine dependence: Secondary | ICD-10-CM | POA: Diagnosis not present

## 2022-08-21 DIAGNOSIS — G20A1 Parkinson's disease without dyskinesia, without mention of fluctuations: Secondary | ICD-10-CM | POA: Diagnosis not present

## 2022-08-21 DIAGNOSIS — I152 Hypertension secondary to endocrine disorders: Secondary | ICD-10-CM | POA: Diagnosis not present

## 2022-08-21 DIAGNOSIS — F0284 Dementia in other diseases classified elsewhere, unspecified severity, with anxiety: Secondary | ICD-10-CM | POA: Diagnosis not present

## 2022-08-21 DIAGNOSIS — E669 Obesity, unspecified: Secondary | ICD-10-CM | POA: Diagnosis not present

## 2022-08-21 DIAGNOSIS — S81812D Laceration without foreign body, left lower leg, subsequent encounter: Secondary | ICD-10-CM | POA: Diagnosis not present

## 2022-08-21 DIAGNOSIS — F339 Major depressive disorder, recurrent, unspecified: Secondary | ICD-10-CM | POA: Diagnosis not present

## 2022-08-21 DIAGNOSIS — Z9181 History of falling: Secondary | ICD-10-CM | POA: Diagnosis not present

## 2022-08-21 DIAGNOSIS — E1159 Type 2 diabetes mellitus with other circulatory complications: Secondary | ICD-10-CM | POA: Diagnosis not present

## 2022-08-28 DIAGNOSIS — F0283 Dementia in other diseases classified elsewhere, unspecified severity, with mood disturbance: Secondary | ICD-10-CM | POA: Diagnosis not present

## 2022-08-28 DIAGNOSIS — Z6829 Body mass index (BMI) 29.0-29.9, adult: Secondary | ICD-10-CM | POA: Diagnosis not present

## 2022-08-28 DIAGNOSIS — S81812D Laceration without foreign body, left lower leg, subsequent encounter: Secondary | ICD-10-CM | POA: Diagnosis not present

## 2022-08-28 DIAGNOSIS — I152 Hypertension secondary to endocrine disorders: Secondary | ICD-10-CM | POA: Diagnosis not present

## 2022-08-28 DIAGNOSIS — E1159 Type 2 diabetes mellitus with other circulatory complications: Secondary | ICD-10-CM | POA: Diagnosis not present

## 2022-08-28 DIAGNOSIS — Z8673 Personal history of transient ischemic attack (TIA), and cerebral infarction without residual deficits: Secondary | ICD-10-CM | POA: Diagnosis not present

## 2022-08-28 DIAGNOSIS — Z9181 History of falling: Secondary | ICD-10-CM | POA: Diagnosis not present

## 2022-08-28 DIAGNOSIS — E440 Moderate protein-calorie malnutrition: Secondary | ICD-10-CM | POA: Diagnosis not present

## 2022-08-28 DIAGNOSIS — Z87891 Personal history of nicotine dependence: Secondary | ICD-10-CM | POA: Diagnosis not present

## 2022-08-28 DIAGNOSIS — E669 Obesity, unspecified: Secondary | ICD-10-CM | POA: Diagnosis not present

## 2022-08-28 DIAGNOSIS — F0284 Dementia in other diseases classified elsewhere, unspecified severity, with anxiety: Secondary | ICD-10-CM | POA: Diagnosis not present

## 2022-08-28 DIAGNOSIS — F339 Major depressive disorder, recurrent, unspecified: Secondary | ICD-10-CM | POA: Diagnosis not present

## 2022-08-28 DIAGNOSIS — M48061 Spinal stenosis, lumbar region without neurogenic claudication: Secondary | ICD-10-CM | POA: Diagnosis not present

## 2022-08-28 DIAGNOSIS — G20A1 Parkinson's disease without dyskinesia, without mention of fluctuations: Secondary | ICD-10-CM | POA: Diagnosis not present

## 2022-09-04 DIAGNOSIS — Z9181 History of falling: Secondary | ICD-10-CM | POA: Diagnosis not present

## 2022-09-04 DIAGNOSIS — I152 Hypertension secondary to endocrine disorders: Secondary | ICD-10-CM | POA: Diagnosis not present

## 2022-09-04 DIAGNOSIS — G20A1 Parkinson's disease without dyskinesia, without mention of fluctuations: Secondary | ICD-10-CM | POA: Diagnosis not present

## 2022-09-04 DIAGNOSIS — F0283 Dementia in other diseases classified elsewhere, unspecified severity, with mood disturbance: Secondary | ICD-10-CM | POA: Diagnosis not present

## 2022-09-04 DIAGNOSIS — F339 Major depressive disorder, recurrent, unspecified: Secondary | ICD-10-CM | POA: Diagnosis not present

## 2022-09-04 DIAGNOSIS — E669 Obesity, unspecified: Secondary | ICD-10-CM | POA: Diagnosis not present

## 2022-09-04 DIAGNOSIS — S81812D Laceration without foreign body, left lower leg, subsequent encounter: Secondary | ICD-10-CM | POA: Diagnosis not present

## 2022-09-04 DIAGNOSIS — Z87891 Personal history of nicotine dependence: Secondary | ICD-10-CM | POA: Diagnosis not present

## 2022-09-04 DIAGNOSIS — Z6829 Body mass index (BMI) 29.0-29.9, adult: Secondary | ICD-10-CM | POA: Diagnosis not present

## 2022-09-04 DIAGNOSIS — E440 Moderate protein-calorie malnutrition: Secondary | ICD-10-CM | POA: Diagnosis not present

## 2022-09-04 DIAGNOSIS — E1159 Type 2 diabetes mellitus with other circulatory complications: Secondary | ICD-10-CM | POA: Diagnosis not present

## 2022-09-04 DIAGNOSIS — F0284 Dementia in other diseases classified elsewhere, unspecified severity, with anxiety: Secondary | ICD-10-CM | POA: Diagnosis not present

## 2022-09-04 DIAGNOSIS — Z8673 Personal history of transient ischemic attack (TIA), and cerebral infarction without residual deficits: Secondary | ICD-10-CM | POA: Diagnosis not present

## 2022-09-04 DIAGNOSIS — M48061 Spinal stenosis, lumbar region without neurogenic claudication: Secondary | ICD-10-CM | POA: Diagnosis not present

## 2022-09-15 DIAGNOSIS — I10 Essential (primary) hypertension: Secondary | ICD-10-CM | POA: Diagnosis not present

## 2022-09-15 DIAGNOSIS — G20A1 Parkinson's disease without dyskinesia, without mention of fluctuations: Secondary | ICD-10-CM | POA: Diagnosis not present

## 2022-09-15 DIAGNOSIS — M549 Dorsalgia, unspecified: Secondary | ICD-10-CM | POA: Diagnosis not present

## 2022-09-15 DIAGNOSIS — M25552 Pain in left hip: Secondary | ICD-10-CM | POA: Diagnosis not present

## 2022-09-15 DIAGNOSIS — Z743 Need for continuous supervision: Secondary | ICD-10-CM | POA: Diagnosis not present

## 2022-09-15 DIAGNOSIS — Z96642 Presence of left artificial hip joint: Secondary | ICD-10-CM | POA: Diagnosis not present

## 2022-09-15 DIAGNOSIS — W0110XA Fall on same level from slipping, tripping and stumbling with subsequent striking against unspecified object, initial encounter: Secondary | ICD-10-CM | POA: Diagnosis not present

## 2022-09-15 DIAGNOSIS — Z79899 Other long term (current) drug therapy: Secondary | ICD-10-CM | POA: Diagnosis not present

## 2022-09-15 DIAGNOSIS — E785 Hyperlipidemia, unspecified: Secondary | ICD-10-CM | POA: Diagnosis not present

## 2022-09-15 DIAGNOSIS — G8929 Other chronic pain: Secondary | ICD-10-CM | POA: Diagnosis not present

## 2022-09-15 DIAGNOSIS — M545 Low back pain, unspecified: Secondary | ICD-10-CM | POA: Diagnosis not present

## 2022-09-15 DIAGNOSIS — E119 Type 2 diabetes mellitus without complications: Secondary | ICD-10-CM | POA: Diagnosis not present

## 2022-09-15 DIAGNOSIS — Z8673 Personal history of transient ischemic attack (TIA), and cerebral infarction without residual deficits: Secondary | ICD-10-CM | POA: Diagnosis not present

## 2022-09-15 DIAGNOSIS — Z7984 Long term (current) use of oral hypoglycemic drugs: Secondary | ICD-10-CM | POA: Diagnosis not present

## 2022-09-19 DIAGNOSIS — I10 Essential (primary) hypertension: Secondary | ICD-10-CM | POA: Diagnosis not present

## 2022-09-19 DIAGNOSIS — R300 Dysuria: Secondary | ICD-10-CM | POA: Diagnosis not present

## 2022-09-19 DIAGNOSIS — G300 Alzheimer's disease with early onset: Secondary | ICD-10-CM | POA: Diagnosis not present

## 2022-09-19 DIAGNOSIS — Z299 Encounter for prophylactic measures, unspecified: Secondary | ICD-10-CM | POA: Diagnosis not present

## 2022-09-19 DIAGNOSIS — R296 Repeated falls: Secondary | ICD-10-CM | POA: Diagnosis not present

## 2022-09-19 DIAGNOSIS — D692 Other nonthrombocytopenic purpura: Secondary | ICD-10-CM | POA: Diagnosis not present

## 2022-09-22 ENCOUNTER — Emergency Department (HOSPITAL_COMMUNITY): Payer: Medicare HMO

## 2022-09-22 ENCOUNTER — Inpatient Hospital Stay (HOSPITAL_COMMUNITY)
Admission: EM | Admit: 2022-09-22 | Discharge: 2022-09-27 | DRG: 948 | Disposition: A | Discharge status: Skilled Nursing Facility | Payer: Medicare HMO | Attending: Family Medicine | Admitting: Family Medicine

## 2022-09-22 ENCOUNTER — Other Ambulatory Visit: Payer: Self-pay

## 2022-09-22 ENCOUNTER — Encounter (HOSPITAL_COMMUNITY): Payer: Self-pay | Admitting: Emergency Medicine

## 2022-09-22 DIAGNOSIS — I6782 Cerebral ischemia: Secondary | ICD-10-CM | POA: Diagnosis not present

## 2022-09-22 DIAGNOSIS — C7951 Secondary malignant neoplasm of bone: Secondary | ICD-10-CM | POA: Diagnosis not present

## 2022-09-22 DIAGNOSIS — M48061 Spinal stenosis, lumbar region without neurogenic claudication: Secondary | ICD-10-CM | POA: Diagnosis not present

## 2022-09-22 DIAGNOSIS — R531 Weakness: Secondary | ICD-10-CM | POA: Diagnosis not present

## 2022-09-22 DIAGNOSIS — M549 Dorsalgia, unspecified: Secondary | ICD-10-CM | POA: Diagnosis present

## 2022-09-22 DIAGNOSIS — Z7189 Other specified counseling: Secondary | ICD-10-CM

## 2022-09-22 DIAGNOSIS — R29810 Facial weakness: Secondary | ICD-10-CM | POA: Diagnosis not present

## 2022-09-22 DIAGNOSIS — R410 Disorientation, unspecified: Secondary | ICD-10-CM | POA: Diagnosis not present

## 2022-09-22 DIAGNOSIS — S8011XA Contusion of right lower leg, initial encounter: Secondary | ICD-10-CM | POA: Diagnosis present

## 2022-09-22 DIAGNOSIS — G20A1 Parkinson's disease without dyskinesia, without mention of fluctuations: Secondary | ICD-10-CM | POA: Diagnosis present

## 2022-09-22 DIAGNOSIS — R2681 Unsteadiness on feet: Secondary | ICD-10-CM | POA: Diagnosis present

## 2022-09-22 DIAGNOSIS — D630 Anemia in neoplastic disease: Secondary | ICD-10-CM | POA: Diagnosis present

## 2022-09-22 DIAGNOSIS — E119 Type 2 diabetes mellitus without complications: Secondary | ICD-10-CM | POA: Diagnosis present

## 2022-09-22 DIAGNOSIS — F0390 Unspecified dementia without behavioral disturbance: Secondary | ICD-10-CM | POA: Diagnosis present

## 2022-09-22 DIAGNOSIS — R82998 Other abnormal findings in urine: Secondary | ICD-10-CM | POA: Diagnosis not present

## 2022-09-22 DIAGNOSIS — W19XXXA Unspecified fall, initial encounter: Secondary | ICD-10-CM | POA: Diagnosis present

## 2022-09-22 DIAGNOSIS — Z79899 Other long term (current) drug therapy: Secondary | ICD-10-CM

## 2022-09-22 DIAGNOSIS — Z885 Allergy status to narcotic agent status: Secondary | ICD-10-CM

## 2022-09-22 DIAGNOSIS — E876 Hypokalemia: Secondary | ICD-10-CM | POA: Diagnosis present

## 2022-09-22 DIAGNOSIS — Z9049 Acquired absence of other specified parts of digestive tract: Secondary | ICD-10-CM

## 2022-09-22 DIAGNOSIS — Z1152 Encounter for screening for COVID-19: Secondary | ICD-10-CM

## 2022-09-22 DIAGNOSIS — R011 Cardiac murmur, unspecified: Secondary | ICD-10-CM | POA: Diagnosis present

## 2022-09-22 DIAGNOSIS — R590 Localized enlarged lymph nodes: Secondary | ICD-10-CM | POA: Diagnosis not present

## 2022-09-22 DIAGNOSIS — R4781 Slurred speech: Secondary | ICD-10-CM | POA: Diagnosis present

## 2022-09-22 DIAGNOSIS — R29898 Other symptoms and signs involving the musculoskeletal system: Secondary | ICD-10-CM

## 2022-09-22 DIAGNOSIS — Z87891 Personal history of nicotine dependence: Secondary | ICD-10-CM

## 2022-09-22 DIAGNOSIS — Z515 Encounter for palliative care: Secondary | ICD-10-CM

## 2022-09-22 DIAGNOSIS — I152 Hypertension secondary to endocrine disorders: Secondary | ICD-10-CM | POA: Diagnosis present

## 2022-09-22 DIAGNOSIS — R4182 Altered mental status, unspecified: Secondary | ICD-10-CM | POA: Diagnosis not present

## 2022-09-22 DIAGNOSIS — M21372 Foot drop, left foot: Secondary | ICD-10-CM | POA: Diagnosis present

## 2022-09-22 DIAGNOSIS — R918 Other nonspecific abnormal finding of lung field: Secondary | ICD-10-CM | POA: Diagnosis not present

## 2022-09-22 DIAGNOSIS — F0280 Dementia in other diseases classified elsewhere without behavioral disturbance: Secondary | ICD-10-CM | POA: Diagnosis present

## 2022-09-22 DIAGNOSIS — K5730 Diverticulosis of large intestine without perforation or abscess without bleeding: Secondary | ICD-10-CM | POA: Diagnosis not present

## 2022-09-22 DIAGNOSIS — M899 Disorder of bone, unspecified: Secondary | ICD-10-CM | POA: Diagnosis present

## 2022-09-22 DIAGNOSIS — R262 Difficulty in walking, not elsewhere classified: Secondary | ICD-10-CM

## 2022-09-22 DIAGNOSIS — Z853 Personal history of malignant neoplasm of breast: Secondary | ICD-10-CM

## 2022-09-22 DIAGNOSIS — Z743 Need for continuous supervision: Secondary | ICD-10-CM | POA: Diagnosis not present

## 2022-09-22 DIAGNOSIS — R748 Abnormal levels of other serum enzymes: Secondary | ICD-10-CM | POA: Diagnosis present

## 2022-09-22 DIAGNOSIS — Z66 Do not resuscitate: Secondary | ICD-10-CM | POA: Diagnosis present

## 2022-09-22 DIAGNOSIS — C799 Secondary malignant neoplasm of unspecified site: Principal | ICD-10-CM

## 2022-09-22 DIAGNOSIS — E785 Hyperlipidemia, unspecified: Secondary | ICD-10-CM | POA: Diagnosis present

## 2022-09-22 DIAGNOSIS — M47816 Spondylosis without myelopathy or radiculopathy, lumbar region: Secondary | ICD-10-CM | POA: Diagnosis not present

## 2022-09-22 DIAGNOSIS — K7689 Other specified diseases of liver: Secondary | ICD-10-CM | POA: Diagnosis not present

## 2022-09-22 DIAGNOSIS — I959 Hypotension, unspecified: Secondary | ICD-10-CM | POA: Diagnosis not present

## 2022-09-22 DIAGNOSIS — C801 Malignant (primary) neoplasm, unspecified: Secondary | ICD-10-CM | POA: Diagnosis not present

## 2022-09-22 DIAGNOSIS — E860 Dehydration: Secondary | ICD-10-CM | POA: Diagnosis present

## 2022-09-22 DIAGNOSIS — I10 Essential (primary) hypertension: Secondary | ICD-10-CM | POA: Diagnosis present

## 2022-09-22 LAB — COMPREHENSIVE METABOLIC PANEL
ALT: 10 U/L (ref 0–44)
AST: 31 U/L (ref 15–41)
Albumin: 3.3 g/dL — ABNORMAL LOW (ref 3.5–5.0)
Alkaline Phosphatase: 329 U/L — ABNORMAL HIGH (ref 38–126)
Anion gap: 12 (ref 5–15)
BUN: 23 mg/dL (ref 8–23)
CO2: 26 mmol/L (ref 22–32)
Calcium: 9 mg/dL (ref 8.9–10.3)
Chloride: 97 mmol/L — ABNORMAL LOW (ref 98–111)
Creatinine, Ser: 1 mg/dL (ref 0.44–1.00)
GFR, Estimated: 59 mL/min — ABNORMAL LOW (ref >60)
Glucose, Bld: 119 mg/dL — ABNORMAL HIGH (ref 70–99)
Potassium: 3 mmol/L — ABNORMAL LOW (ref 3.5–5.1)
Sodium: 135 mmol/L (ref 135–145)
Total Bilirubin: 0.9 mg/dL (ref 0.3–1.2)
Total Protein: 7.1 g/dL (ref 6.5–8.1)

## 2022-09-22 LAB — URINALYSIS, ROUTINE W REFLEX MICROSCOPIC
Bacteria, UA: NONE SEEN
Bilirubin Urine: NEGATIVE
Glucose, UA: NEGATIVE mg/dL
Hgb urine dipstick: NEGATIVE
Ketones, ur: 5 mg/dL — AB
Leukocytes,Ua: NEGATIVE
Nitrite: NEGATIVE
Protein, ur: 100 mg/dL — AB
Specific Gravity, Urine: 1.027 (ref 1.005–1.030)
pH: 5 (ref 5.0–8.0)

## 2022-09-22 LAB — CBC WITH DIFFERENTIAL/PLATELET
Abs Immature Granulocytes: 0.08 10*3/uL — ABNORMAL HIGH (ref 0.00–0.07)
Basophils Absolute: 0 10*3/uL (ref 0.0–0.1)
Basophils Relative: 1 %
Eosinophils Absolute: 0.1 10*3/uL (ref 0.0–0.5)
Eosinophils Relative: 1 %
HCT: 40.3 % (ref 36.0–46.0)
Hemoglobin: 12.8 g/dL (ref 12.0–15.0)
Immature Granulocytes: 1 %
Lymphocytes Relative: 9 %
Lymphs Abs: 0.8 10*3/uL (ref 0.7–4.0)
MCH: 25.7 pg — ABNORMAL LOW (ref 26.0–34.0)
MCHC: 31.8 g/dL (ref 30.0–36.0)
MCV: 80.9 fL (ref 80.0–100.0)
Monocytes Absolute: 0.6 10*3/uL (ref 0.1–1.0)
Monocytes Relative: 7 %
Neutro Abs: 6.4 10*3/uL (ref 1.7–7.7)
Neutrophils Relative %: 81 %
Platelets: 265 10*3/uL (ref 150–400)
RBC: 4.98 MIL/uL (ref 3.87–5.11)
RDW: 14.3 % (ref 11.5–15.5)
WBC: 8 10*3/uL (ref 4.0–10.5)
nRBC: 0 % (ref 0.0–0.2)

## 2022-09-22 LAB — BLOOD GAS, VENOUS
Acid-Base Excess: 8.8 mmol/L — ABNORMAL HIGH (ref 0.0–2.0)
Bicarbonate: 33.5 mmol/L — ABNORMAL HIGH (ref 20.0–28.0)
Drawn by: 27160
O2 Saturation: 38.9 %
Patient temperature: 36.7
pCO2, Ven: 44 mmHg (ref 44–60)
pH, Ven: 7.48 — ABNORMAL HIGH (ref 7.25–7.43)
pO2, Ven: <31 mmHg — CL (ref 32–45)

## 2022-09-22 LAB — CK: Total CK: 212 U/L (ref 38–234)

## 2022-09-22 LAB — MRSA NEXT GEN BY PCR, NASAL: MRSA by PCR Next Gen: NOT DETECTED

## 2022-09-22 LAB — AMMONIA: Ammonia: 12 umol/L (ref 9–35)

## 2022-09-22 LAB — TROPONIN I (HIGH SENSITIVITY)
Troponin I (High Sensitivity): 23 ng/L — ABNORMAL HIGH (ref <18)
Troponin I (High Sensitivity): 22 ng/L — ABNORMAL HIGH (ref <18)

## 2022-09-22 LAB — LIPASE, BLOOD: Lipase: 25 U/L (ref 11–51)

## 2022-09-22 LAB — SARS CORONAVIRUS 2 BY RT PCR: SARS Coronavirus 2 by RT PCR: NEGATIVE

## 2022-09-22 MED ORDER — ACETAMINOPHEN 325 MG PO TABS
650.0000 mg | ORAL_TABLET | Freq: Four times a day (QID) | ORAL | Status: DC | PRN
  Administered 2022-09-24 – 2022-09-25 (×4): 650 mg via ORAL
  Filled 2022-09-22 (×4): qty 2

## 2022-09-22 MED ORDER — POTASSIUM CHLORIDE CRYS ER 20 MEQ PO TBCR
40.0000 meq | EXTENDED_RELEASE_TABLET | Freq: Once | ORAL | Status: AC
  Administered 2022-09-22: 40 meq via ORAL
  Filled 2022-09-22: qty 2

## 2022-09-22 MED ORDER — MEMANTINE HCL 10 MG PO TABS
10.0000 mg | ORAL_TABLET | Freq: Two times a day (BID) | ORAL | Status: DC
  Administered 2022-09-22 – 2022-09-27 (×10): 10 mg via ORAL
  Filled 2022-09-22 (×10): qty 1

## 2022-09-22 MED ORDER — HEPARIN SODIUM (PORCINE) 5000 UNIT/ML IJ SOLN
5000.0000 [IU] | Freq: Three times a day (TID) | INTRAMUSCULAR | Status: DC
  Administered 2022-09-23 – 2022-09-27 (×13): 5000 [IU] via SUBCUTANEOUS
  Filled 2022-09-22 (×13): qty 1

## 2022-09-22 MED ORDER — SODIUM CHLORIDE 0.9 % IV BOLUS
1000.0000 mL | Freq: Once | INTRAVENOUS | Status: AC
  Administered 2022-09-22: 1000 mL via INTRAVENOUS

## 2022-09-22 MED ORDER — HYDRALAZINE HCL 20 MG/ML IJ SOLN
10.0000 mg | Freq: Once | INTRAMUSCULAR | Status: AC
  Administered 2022-09-22: 10 mg via INTRAVENOUS
  Filled 2022-09-22: qty 1

## 2022-09-22 MED ORDER — CARBIDOPA-LEVODOPA 25-100 MG PO TABS
1.0000 | ORAL_TABLET | Freq: Four times a day (QID) | ORAL | Status: DC
  Administered 2022-09-22 – 2022-09-27 (×19): 1 via ORAL
  Filled 2022-09-22 (×33): qty 1

## 2022-09-22 MED ORDER — AMANTADINE HCL 100 MG PO CAPS
100.0000 mg | ORAL_CAPSULE | Freq: Two times a day (BID) | ORAL | Status: DC
  Administered 2022-09-22 – 2022-09-27 (×10): 100 mg via ORAL
  Filled 2022-09-22 (×15): qty 1

## 2022-09-22 MED ORDER — ONDANSETRON HCL 4 MG/2ML IJ SOLN: 4.0000 mg | Freq: Four times a day (QID) | INTRAMUSCULAR | Status: DC | PRN

## 2022-09-22 MED ORDER — MORPHINE SULFATE (PF) 2 MG/ML IV SOLN
2.0000 mg | INTRAVENOUS | Status: DC | PRN
  Administered 2022-09-23 – 2022-09-26 (×2): 2 mg via INTRAVENOUS
  Filled 2022-09-22 (×2): qty 1

## 2022-09-22 MED ORDER — METOPROLOL TARTRATE 50 MG PO TABS
50.0000 mg | ORAL_TABLET | Freq: Once | ORAL | Status: AC
  Administered 2022-09-22: 50 mg via ORAL
  Filled 2022-09-22: qty 1

## 2022-09-22 MED ORDER — CHLORHEXIDINE GLUCONATE CLOTH 2 % EX PADS
6.0000 | MEDICATED_PAD | Freq: Every day | CUTANEOUS | Status: DC
  Administered 2022-09-22 – 2022-09-23 (×2): 6 via TOPICAL

## 2022-09-22 MED ORDER — ENSURE ENLIVE PO LIQD
237.0000 mL | Freq: Two times a day (BID) | ORAL | Status: DC
  Administered 2022-09-23 – 2022-09-26 (×4): 237 mL via ORAL

## 2022-09-22 MED ORDER — OXYCODONE HCL 5 MG PO TABS
5.0000 mg | ORAL_TABLET | ORAL | Status: DC | PRN
  Administered 2022-09-23 – 2022-09-26 (×3): 5 mg via ORAL
  Filled 2022-09-22 (×3): qty 1

## 2022-09-22 MED ORDER — ONDANSETRON HCL 4 MG PO TABS: 4.0000 mg | ORAL_TABLET | Freq: Four times a day (QID) | ORAL | Status: DC | PRN

## 2022-09-22 MED ORDER — IOHEXOL 300 MG/ML  SOLN
100.0000 mL | Freq: Once | INTRAMUSCULAR | Status: AC | PRN
  Administered 2022-09-22: 100 mL via INTRAVENOUS

## 2022-09-22 MED ORDER — ACETAMINOPHEN 650 MG RE SUPP: 650.0000 mg | Freq: Four times a day (QID) | RECTAL | Status: DC | PRN

## 2022-09-22 MED ORDER — SODIUM CHLORIDE 0.9 % IV SOLN: INTRAVENOUS | Status: DC

## 2022-09-22 MED ORDER — DULOXETINE HCL 30 MG PO CPEP
30.0000 mg | ORAL_CAPSULE | Freq: Two times a day (BID) | ORAL | Status: DC
  Administered 2022-09-22 – 2022-09-27 (×10): 30 mg via ORAL
  Filled 2022-09-22 (×10): qty 1

## 2022-09-22 MED ORDER — GADOBUTROL 1 MMOL/ML IV SOLN
8.0000 mL | Freq: Once | INTRAVENOUS | Status: AC | PRN
  Administered 2022-09-22: 8 mL via INTRAVENOUS

## 2022-09-22 MED ORDER — METOPROLOL TARTRATE 50 MG PO TABS
50.0000 mg | ORAL_TABLET | Freq: Every day | ORAL | Status: DC
  Administered 2022-09-23 – 2022-09-27 (×5): 50 mg via ORAL
  Filled 2022-09-22 (×5): qty 1

## 2022-09-22 NOTE — ED Triage Notes (Signed)
Pt reports her caregiver has not been to see her in over a week. Pt's granddaughter called EMS for weakness and foul smelling urine. Pt was unable to stand this morning. Pt uses a walker at baseline. EMS and patient both report hallucinations.

## 2022-09-22 NOTE — ED Notes (Signed)
MD made aware of BP cycling high the last couple of cycles.Last BP was 178/162. States she has not taken her BP medications today

## 2022-09-22 NOTE — ED Notes (Signed)
Pt states that she normally uses either a walker or wheelchair to get around at home, but has not been able to walk since Friday. Hx of parkinson's

## 2022-09-22 NOTE — ED Provider Notes (Signed)
[place] EMERGENCY DEPARTMENT AT [facility] Provider Note  CSN: [id number] Arrival date & time: 09/22/22 6387  Chief Complaint(s) Weakness  HPI Vanessa Stephens is a 72 y.o. female with past medical history as below, significant for DM, HTN, HLD, parkinsons disease who presents to the ED with complaint of weakness  Last known normal was approximately 1 week ago, caretaker return from out of town last night but the patient was weak, not acting normally.  She elected to sleep in the chair/recliner.  Woke up around 5 AM reporting she had to go to the bathroom, unable to get back of her chair.  Unable to stand up.  Felt very weak overall.  Daughter felt the patient was more shaky than normal, thought her speech was abnormal, somewhat slurred.  The symptoms(shaking and speech alteration) have resolved.  Patient herself reports that she feels weak overall she has some back pain but that is chronic and unchanged secondary to lying in bed.  Normally she has to sleep or sit in a recliner  Past Medical History Past Medical History:  Diagnosis Date   Cancer (HCC)    Diabetes mellitus without complication (HCC)    Hypertension    Patient Active Problem List   Diagnosis Date Noted   Hypertension associated with type 2 diabetes mellitus (HCC) 10/02/2020   Hyperlipidemia associated with type 2 diabetes mellitus (HCC) 10/02/2020   Hypokalemia 10/02/2020   Chronic constipation 10/02/2020   Urinary incontinence with continuous leakage 10/02/2020   Spinal stenosis of lumbosacral region/Severe canal stenosis at L2-L3 and moderate-to-severe canal stenosis at L3-L4/ 09/28/2020   Generalized weakness 09/24/2020   Heart murmur 09/24/2020   Hyperlipidemia 09/24/2020   Diabetes mellitus type 2 in obese 09/24/2020   Acute lower UTI due to E Coli 09/24/2020   Home Medication(s) Prior to Admission medications   Medication Sig Start Date End Date Taking? Authorizing Provider  amantadine  (SYMMETREL) 100 MG capsule Take 1 capsule (100 mg total) by mouth 2 (two) times daily. 06/18/22   Penumalli, Glenford Bayley, MD  carbidopa-levodopa (SINEMET IR) 25-100 MG tablet Take 1 tablet by mouth 4 (four) times daily. 05/22/22   Penumalli, Glenford Bayley, MD  cyanocobalamin (VITAMIN B12) 1000 MCG tablet Take 1,000 mcg by mouth daily.    [provider]  DULoxetine (CYMBALTA) 30 MG capsule Take 30 mg by mouth 2 (two) times daily. 05/20/22   [provider]  furosemide (LASIX) 40 MG tablet Take 40 mg by mouth daily as needed.    [provider]  memantine (NAMENDA) 10 MG tablet Take 1 tablet (10 mg total) by mouth 2 (two) times daily. 06/17/22   Penumalli, Glenford Bayley, MD  metoprolol tartrate (LOPRESSOR) 50 MG tablet Take 1 tablet by mouth daily.    [provider]  oxyCODONE-acetaminophen (PERCOCET/ROXICET) 5-325 MG tablet Take 1 tablet twice a day by oral route as needed. 11/29/20   [provider]  Potassium 99 MG TABS Take by mouth daily.    [provider]  Past Surgical History Past Surgical History:  Procedure Laterality Date   BACK SURGERY     BREAST LUMPECTOMY     CHOLECYSTECTOMY     Family History No family history on file.  Social History Social History   Tobacco Use   Smoking status: Former    Types: Cigarettes   Smokeless tobacco: Never  Vaping Use   Vaping status: Never Used  Substance Use Topics   Alcohol use: Not Currently   Drug use: Never   Allergies Codeine  Review of Systems Review of Systems  Constitutional:  Positive for fatigue. Negative for chills and fever.  HENT:  Negative for facial swelling and trouble swallowing.   Eyes:  Negative for photophobia and visual disturbance.  Respiratory:  Negative for cough and shortness of breath.   Cardiovascular:  Negative for chest pain and  palpitations.  Gastrointestinal:  Negative for abdominal pain, nausea and vomiting.  Endocrine: Negative for polydipsia and polyuria.  Genitourinary:  Positive for difficulty urinating. Negative for hematuria.  Musculoskeletal:  Negative for gait problem and joint swelling.  Skin:  Negative for pallor and rash.  Neurological:  Positive for weakness. Negative for syncope and headaches.  Psychiatric/Behavioral:  Negative for agitation and confusion.     Physical Exam Vital Signs  I have reviewed the triage vital signs BP (!) 155/83 (BP Location: Left Arm)   Pulse 79   Temp 98.1 F (36.7 C) (Oral)   Resp 18   SpO2 93%  Physical Exam Vitals and nursing note reviewed.  Constitutional:      General: She is not in acute distress.    Appearance: Normal appearance.  HENT:     Head: Normocephalic and atraumatic. No raccoon eyes, Battle's sign, right periorbital erythema or left periorbital erythema.     Right Ear: External ear normal.     Left Ear: External ear normal.     Nose: Nose normal.     Mouth/Throat:     Mouth: Mucous membranes are moist.  Eyes:     General: No scleral icterus.       Right eye: No discharge.        Left eye: No discharge.     Extraocular Movements: Extraocular movements intact.     Pupils: Pupils are equal, round, and reactive to light.  Cardiovascular:     Rate and Rhythm: Normal rate and regular rhythm.     Pulses: Normal pulses.     Heart sounds: Normal heart sounds.  Pulmonary:     Effort: Pulmonary effort is normal. No respiratory distress.     Breath sounds: Normal breath sounds. No stridor.  Abdominal:     General: Abdomen is flat. There is no distension.     Palpations: Abdomen is soft.     Tenderness: There is no abdominal tenderness.  Musculoskeletal:     Cervical back: No rigidity.     Right lower leg: No edema.     Left lower leg: No edema.  Skin:    General: Skin is warm and dry.     Capillary Refill: Capillary refill takes less than  2 seconds.  Neurological:     Mental Status: She is alert and oriented to person, place, and time.     GCS: GCS eye subscore is 4. GCS verbal subscore is 5. GCS motor subscore is 6.     Cranial Nerves: Cranial nerves 2-12 are intact.     Sensory: Sensation is intact.     Motor: Motor function  is intact. No pronator drift.     Coordination: Coordination is intact.     Comments: Gait testing deferred secondary to patient safety.   Psychiatric:        Mood and Affect: Mood normal.        Behavior: Behavior normal. Behavior is cooperative.     ED Results and Treatments Labs (all labs ordered are listed, but only abnormal results are displayed) Labs Reviewed - No data to display                                                                                                                        Radiology No results found.  Pertinent labs & imaging results that were available during my care of the patient were reviewed by me and considered in my medical decision making (see MDM for details).  Medications Ordered in ED Medications - No data to display                                                                                                                                   Procedures Procedures  (including critical care time)  Medical Decision Making / ED Course    Medical Decision Making:    SHERITHA DEUTSCH is a 72 y.o. female with past medical history as below, significant for DM, HTN, HLD who presents to the ED with complaint of weakness. The complaint involves an extensive differential diagnosis and also carries with it a high risk of complications and morbidity.  Serious etiology was considered. Ddx includes but is not limited to: Differential diagnoses for altered mental status includes but is not exclusive to alcohol, illicit or prescription medications, intracranial pathology such as stroke, intracerebral hemorrhage, fever or infectious causes including sepsis,  hypoxemia, uremia, trauma, endocrine related disorders such as diabetes, hypoglycemia, thyroid-related diseases, etc.   Complete initial physical exam performed, notably the patient  was no acute distress, sitting upright, HDS.    Reviewed and confirmed nursing documentation for past medical history, family history, social history.  Vital signs reviewed.    Narrative: 72 year old female history of dementia diabetes, other history as above here secondary to complaint of weakness Last known normal approximately-1 week ago per caretaker/granddaughter Reports difficulty urinating and weakness to her legs, unable to stand/walk starting this morning.  Additional history obtained: -Additional history obtained from family -External records from outside source obtained and reviewed including: Chart review including previous notes, labs, imaging, consultation notes including  Echocardiogram 8/22 with LVEF 60 to 65%, G1 DD Home medications, prior labs and imaging   Lab Tests: -I ordered, reviewed, and interpreted labs.   The pertinent results include:   Labs Reviewed - No data to display  Notable for ***  EKG   EKG Interpretation Date/Time:    Ventricular Rate:    PR Interval:    QRS Duration:    QT Interval:    QTC Calculation:   R Axis:      Text Interpretation:           Imaging Studies ordered: I ordered imaging studies including *** I independently visualized the following imaging with scope of interpretation limited to determining acute life threatening conditions related to emergency care; findings noted above, significant for *** I independently visualized and interpreted imaging. I agree with the radiologist interpretation   Medicines ordered and prescription drug management: No orders of the defined types were placed in this encounter.   -I have reviewed the patients home medicines and have made adjustments as  needed   Consultations Obtained: I requested consultation with the ***,  and discussed lab and imaging findings as well as pertinent plan - they recommend: ***   Cardiac Monitoring: The patient was maintained on a cardiac monitor.  I personally viewed and interpreted the cardiac monitored which showed an underlying rhythm of: ***  Social Determinants of Health:  Diagnosis or treatment significantly limited by social determinants of health: {wssoc:28071}   Reevaluation: After the interventions noted above, I reevaluated the patient and found that they have {resolved/improved/worsened:23923::"improved"}  Co morbidities that complicate the patient evaluation  Past Medical History:  Diagnosis Date   Cancer (HCC)    Diabetes mellitus without complication (HCC)    Hypertension       Dispostion: Disposition decision including need for hospitalization was considered, and patient {wsdispo:28070::"discharged from emergency department."}    Final Clinical Impression(s) / ED Diagnoses Final diagnoses:  None

## 2022-09-22 NOTE — ED Provider Notes (Signed)
9:12 PM Assumed care of patient from off-going team. For more details, please see note from same day.  In brief, this is a 72 y.o. female who p/w generalized weakness, h/o parkinson's, granddaughter is her caretaker and pt has been alone for a week. Not acting normally, wouldn't get out of recliner, couldn't walk/stand. LKN one week ago. No focal neuro deficits on exam. Scans show lytic lesions everywhere, possibly new diagnosis of multiple myeloma. Mild hypokalemia.   Plan/Dispo at time of sign-out & ED Course since sign-out: [ ]  MRI lumbar spine d/t being unable to walk, then admit  BP (!) 177/76   Pulse 77   Temp 98 F (36.7 C)   Resp 18   Ht 5\' 4"  (1.626 m)   Wt 82.6 kg   SpO2 92%   BMI 31.24 kg/m    ED Course:   Clinical Course as of 09/22/22 2112  Sun Sep 22, 2022  0901 CTH: "IMPRESSION: 1. Multiple lytic lesions in the calvarium, not present on brain MRI from last year, concerning for metastatic disease or myeloma. 2. No acute intracranial finding." [SG]  1602 CTA  concerning for diffuse metastatic disease [SG]  2041 MR Lumbar Spine W Wo Contrast 1. Diffuse bony metastatic disease to the lower thoracic, lumbar spine and sacrum. No evidence of epidural or paraspinal tumor. 2. Unchanged multilevel lumbar spondylosis, worst at L2-3 and L3-4, where there is severe spinal canal stenosis at L2-L3 and L3-L4. 3. Unchanged moderate spinal canal stenosis at L1-L2.   [HN]  2111 Patient without emergent surgical abnormalities to lumbar spine. Does have diffuse metastatic disease. Consulted and admitted to hospitalist.  [HN]    Clinical Course User Index [HN] Loetta Rough, MD [SG] Sloan Leiter, DO   ------------------------------- Vivi Barrack, MD Emergency Medicine  This note was created using dictation software, which may contain spelling or grammatical errors.   Loetta Rough, MD 09/22/22 2112

## 2022-09-22 NOTE — ED Notes (Signed)
Informed hospitalist of continued hypertension. Awaiting orders.

## 2022-09-22 NOTE — ED Notes (Signed)
Patient transported to MRI 

## 2022-09-23 DIAGNOSIS — G20A1 Parkinson's disease without dyskinesia, without mention of fluctuations: Secondary | ICD-10-CM | POA: Diagnosis not present

## 2022-09-23 DIAGNOSIS — Z7189 Other specified counseling: Secondary | ICD-10-CM | POA: Diagnosis not present

## 2022-09-23 DIAGNOSIS — Z66 Do not resuscitate: Secondary | ICD-10-CM | POA: Diagnosis not present

## 2022-09-23 DIAGNOSIS — E876 Hypokalemia: Secondary | ICD-10-CM | POA: Diagnosis not present

## 2022-09-23 DIAGNOSIS — Z79899 Other long term (current) drug therapy: Secondary | ICD-10-CM | POA: Diagnosis not present

## 2022-09-23 DIAGNOSIS — M21372 Foot drop, left foot: Secondary | ICD-10-CM | POA: Diagnosis present

## 2022-09-23 DIAGNOSIS — Z885 Allergy status to narcotic agent status: Secondary | ICD-10-CM | POA: Diagnosis not present

## 2022-09-23 DIAGNOSIS — R2681 Unsteadiness on feet: Secondary | ICD-10-CM | POA: Diagnosis present

## 2022-09-23 DIAGNOSIS — I152 Hypertension secondary to endocrine disorders: Secondary | ICD-10-CM | POA: Diagnosis present

## 2022-09-23 DIAGNOSIS — F0390 Unspecified dementia without behavioral disturbance: Secondary | ICD-10-CM | POA: Diagnosis present

## 2022-09-23 DIAGNOSIS — F0280 Dementia in other diseases classified elsewhere without behavioral disturbance: Secondary | ICD-10-CM

## 2022-09-23 DIAGNOSIS — R531 Weakness: Secondary | ICD-10-CM

## 2022-09-23 DIAGNOSIS — M899 Disorder of bone, unspecified: Secondary | ICD-10-CM | POA: Diagnosis not present

## 2022-09-23 DIAGNOSIS — C7951 Secondary malignant neoplasm of bone: Secondary | ICD-10-CM | POA: Diagnosis not present

## 2022-09-23 DIAGNOSIS — E785 Hyperlipidemia, unspecified: Secondary | ICD-10-CM

## 2022-09-23 DIAGNOSIS — R4781 Slurred speech: Secondary | ICD-10-CM | POA: Diagnosis present

## 2022-09-23 DIAGNOSIS — S8011XA Contusion of right lower leg, initial encounter: Secondary | ICD-10-CM | POA: Diagnosis present

## 2022-09-23 DIAGNOSIS — W19XXXA Unspecified fall, initial encounter: Secondary | ICD-10-CM | POA: Diagnosis not present

## 2022-09-23 DIAGNOSIS — E860 Dehydration: Secondary | ICD-10-CM | POA: Diagnosis not present

## 2022-09-23 DIAGNOSIS — Z87891 Personal history of nicotine dependence: Secondary | ICD-10-CM | POA: Diagnosis not present

## 2022-09-23 DIAGNOSIS — Z515 Encounter for palliative care: Secondary | ICD-10-CM | POA: Diagnosis not present

## 2022-09-23 DIAGNOSIS — M549 Dorsalgia, unspecified: Secondary | ICD-10-CM | POA: Diagnosis present

## 2022-09-23 DIAGNOSIS — Z1152 Encounter for screening for COVID-19: Secondary | ICD-10-CM | POA: Diagnosis not present

## 2022-09-23 DIAGNOSIS — Z853 Personal history of malignant neoplasm of breast: Secondary | ICD-10-CM | POA: Diagnosis not present

## 2022-09-23 DIAGNOSIS — R748 Abnormal levels of other serum enzymes: Secondary | ICD-10-CM | POA: Diagnosis present

## 2022-09-23 DIAGNOSIS — D630 Anemia in neoplastic disease: Secondary | ICD-10-CM | POA: Diagnosis present

## 2022-09-23 DIAGNOSIS — I10 Essential (primary) hypertension: Secondary | ICD-10-CM | POA: Diagnosis present

## 2022-09-23 DIAGNOSIS — E119 Type 2 diabetes mellitus without complications: Secondary | ICD-10-CM | POA: Diagnosis present

## 2022-09-23 LAB — COMPREHENSIVE METABOLIC PANEL
ALT: 8 U/L (ref 0–44)
AST: 32 U/L (ref 15–41)
Albumin: 2.9 g/dL — ABNORMAL LOW (ref 3.5–5.0)
Alkaline Phosphatase: 371 U/L — ABNORMAL HIGH (ref 38–126)
Anion gap: 11 (ref 5–15)
BUN: 15 mg/dL (ref 8–23)
CO2: 23 mmol/L (ref 22–32)
Calcium: 8.5 mg/dL — ABNORMAL LOW (ref 8.9–10.3)
Chloride: 102 mmol/L (ref 98–111)
Creatinine, Ser: 0.65 mg/dL (ref 0.44–1.00)
GFR, Estimated: >60 mL/min (ref >60)
Glucose, Bld: 97 mg/dL (ref 70–99)
Potassium: 3.1 mmol/L — ABNORMAL LOW (ref 3.5–5.1)
Sodium: 136 mmol/L (ref 135–145)
Total Bilirubin: 0.8 mg/dL (ref 0.3–1.2)
Total Protein: 6.6 g/dL (ref 6.5–8.1)

## 2022-09-23 LAB — CBC WITH DIFFERENTIAL/PLATELET
Abs Immature Granulocytes: 0.08 10*3/uL — ABNORMAL HIGH (ref 0.00–0.07)
Basophils Absolute: 0.1 10*3/uL (ref 0.0–0.1)
Basophils Relative: 1 %
Eosinophils Absolute: 0.1 10*3/uL (ref 0.0–0.5)
Eosinophils Relative: 1 %
HCT: 37.9 % (ref 36.0–46.0)
Hemoglobin: 12 g/dL (ref 12.0–15.0)
Immature Granulocytes: 1 %
Lymphocytes Relative: 11 %
Lymphs Abs: 0.9 10*3/uL (ref 0.7–4.0)
MCH: 25.4 pg — ABNORMAL LOW (ref 26.0–34.0)
MCHC: 31.7 g/dL (ref 30.0–36.0)
MCV: 80.3 fL (ref 80.0–100.0)
Monocytes Absolute: 0.8 10*3/uL (ref 0.1–1.0)
Monocytes Relative: 10 %
Neutro Abs: 6.2 10*3/uL (ref 1.7–7.7)
Neutrophils Relative %: 76 %
Platelets: 259 10*3/uL (ref 150–400)
RBC: 4.72 MIL/uL (ref 3.87–5.11)
RDW: 14.1 % (ref 11.5–15.5)
WBC: 8.2 10*3/uL (ref 4.0–10.5)
nRBC: 0 % (ref 0.0–0.2)

## 2022-09-23 LAB — MAGNESIUM: Magnesium: 1.7 mg/dL (ref 1.7–2.4)

## 2022-09-23 LAB — MRSA NEXT GEN BY PCR, NASAL: MRSA by PCR Next Gen: NOT DETECTED

## 2022-09-23 MED ORDER — POTASSIUM CHLORIDE CRYS ER 20 MEQ PO TBCR
40.0000 meq | EXTENDED_RELEASE_TABLET | Freq: Once | ORAL | Status: AC
  Administered 2022-09-23: 40 meq via ORAL
  Filled 2022-09-23: qty 2

## 2022-09-23 MED ORDER — HYDRALAZINE HCL 20 MG/ML IJ SOLN
10.0000 mg | Freq: Once | INTRAMUSCULAR | Status: AC
  Administered 2022-09-23: 10 mg via INTRAVENOUS
  Filled 2022-09-23: qty 1

## 2022-09-23 MED ORDER — HALOPERIDOL LACTATE 5 MG/ML IJ SOLN
2.0000 mg | Freq: Four times a day (QID) | INTRAMUSCULAR | Status: DC | PRN
  Administered 2022-09-23: 5 mg via INTRAVENOUS
  Filled 2022-09-23: qty 1

## 2022-09-23 MED ORDER — ADULT MULTIVITAMIN W/MINERALS CH
1.0000 | ORAL_TABLET | Freq: Every day | ORAL | Status: DC
  Administered 2022-09-23 – 2022-09-27 (×5): 1 via ORAL
  Filled 2022-09-23 (×5): qty 1

## 2022-09-23 NOTE — Assessment & Plan Note (Addendum)
Patient is normally able to ambulate with a walker On admission, she could not even stand up Dehydration contributing with dry mucous membranes, 23:1.00 BUN/creatinine ratio COVID negative CT head shows metastatic disease with lytic lesions in the calvarium MRI lumbar spine also shows diffuse bony metastatic disease to the lower thoracic, lumbar, and sacrum Will consult oncology, PT/OT

## 2022-09-23 NOTE — H&P (Signed)
History and Physical    Patient: Vanessa Stephens GNF:[id number] DOB: 1950-10-06 DOA: 09/22/2022 DOS: the patient was seen and examined on 09/23/2022 PCP: Vanessa Specking, MD  Patient coming from: Home  Chief Complaint:  Chief Complaint  Patient presents with   Weakness   HPI: Vanessa Stephens is a 72 y.o. female with medical history significant of cancer, diabetes mellitus, Parkinson's dementia, hypertension, and more presents the ED with a chief complaint of generalized weakness.  Patient reports that she fell down a couple of days ago and hurt her right leg.  She reports it has been healing well.  And she thinks that is why she is in the hospital.  When reminded that she was weak today, she reports that she remembers not being able to stand up.  She reports she has not been eating or drinking at home.  She says it is because she has not had any appetite.  She also reports that her caretaker had left for a week.  She said just friends and people were helping her during the week that her caretaker was gone, but she is very vague about it.  Patient denies any chest pain, nausea, vomiting, diarrhea, dysuria, fever, dyspnea.  She is normally able to ambulate with a walker and today could not even stand up.  Patient has no other complaints at this time.  DNR as per ACP documents. Review of Systems: As mentioned in the history of present illness. All other systems reviewed and are negative. Past Medical History:  Diagnosis Date   Cancer (HCC)    Diabetes mellitus without complication (HCC)    Hypertension    Past Surgical History:  Procedure Laterality Date   BACK SURGERY     BREAST LUMPECTOMY     CHOLECYSTECTOMY     Social History:  reports that she has quit smoking. Her smoking use included cigarettes. She has never used smokeless tobacco. She reports that she does not currently use alcohol. She reports that she does not use drugs.  Allergies  Allergen Reactions   Codeine Nausea And  Vomiting    Patient stated she was not allergic to codeine anymore     History reviewed. No pertinent family history.  Prior to Admission medications   Medication Sig Start Date End Date Taking? Authorizing Provider  amantadine (SYMMETREL) 100 MG capsule Take 1 capsule (100 mg total) by mouth 2 (two) times daily. 06/18/22   Penumalli, Glenford Bayley, MD  carbidopa-levodopa (SINEMET IR) 25-100 MG tablet Take 1 tablet by mouth 4 (four) times daily. 05/22/22   Penumalli, Glenford Bayley, MD  cyanocobalamin (VITAMIN B12) 1000 MCG tablet Take 1,000 mcg by mouth daily.    [provider]  DULoxetine (CYMBALTA) 30 MG capsule Take 30 mg by mouth 2 (two) times daily. 05/20/22   [provider]  furosemide (LASIX) 40 MG tablet Take 40 mg by mouth daily as needed.    [provider]  memantine (NAMENDA) 10 MG tablet Take 1 tablet (10 mg total) by mouth 2 (two) times daily. 06/17/22   Penumalli, Glenford Bayley, MD  metoprolol tartrate (LOPRESSOR) 50 MG tablet Take 1 tablet by mouth daily.    [provider]  oxyCODONE-acetaminophen (PERCOCET/ROXICET) 5-325 MG tablet Take 1 tablet twice a day by oral route as needed. 11/29/20   [provider]  Potassium 99 MG TABS Take by mouth daily.    [provider]    Physical Exam: Vitals:   09/22/22 1954 09/22/22 2215  09/22/22 2245 09/22/22 2300  BP:  (!) 181/86 (!) 213/65 (!) 193/110  Pulse: 77 76 77 76  Resp:  18 (!) 21 18  Temp:   98.5 F (36.9 C)   TempSrc:   Oral   SpO2: 92% 92% 100% 97%  Weight:   72.9 kg   Height:   5\' 4"  (1.626 m)    1.  General: Patient lying supine in bed,  no acute distress   2. Psychiatric: Alert and oriented x person and place, mood and behavior normal for situation, pleasant and cooperative with exam   3. Neurologic: Speech and language are normal, face is symmetric, moves all 4 extremities voluntarily, tremors, at baseline without acute deficits on limited exam   4. HEENMT:  Head is  atraumatic, normocephalic, pupils reactive to light, neck is supple, trachea is midline, mucous membranes are moist   5. Respiratory : Lungs are clear to auscultation bilaterally without wheezing, rhonchi, rales, no cyanosis, no increase in work of breathing or accessory muscle use   6. Cardiovascular : Heart rate normal, rhythm is regular, systolic murmur, rubs or gallops, no peripheral edema, peripheral pulses palpated   7. Gastrointestinal:  Abdomen is soft, nondistended, nontender to palpation bowel sounds active, no masses or organomegaly palpated   8. Skin:  Bruising and scabbing to right shin   9.Musculoskeletal:  No acute deformities or trauma, no asymmetry in tone, no peripheral edema, peripheral pulses palpated, no tenderness to palpation in the extremities  Data Reviewed: In the ED Temp 98, heart rate 77-87, respiratory rate 13-22, blood pressure 97/70 2-199/1 100s pH 7.48 No leukocytosis with a white blood cell count 8.0, hemoglobin 12.8 Hypokalemia at 3.0 Alk phos elevated at 329 expected with lytic bone lesions Trope 23 and then 22 CPK 212 COVID-negative CT head shows metastatic disease with lytic lesions in the calvarium, there are also lytic lesions in the lumbar spine, lower thoracic, sacrum EKG shows heart rate 76, QTc 437, sinus rhythm  UA is not indicative of UTI Admission requested for generalized weakness  Assessment and Plan: * Generalized weakness - Patient is normally able to ambulate with a walker - Today she could not even stand up - Dehydration contributing with dry mucous membranes, 23:1.00 BUN/creatinine ratio - COVID-negative - CT head shows metastatic disease with lytic lesions in the calvarium - MRI lumbar spine also shows diffuse bony metastatic disease to the lower thoracic, lumbar, and sacrum -  Lytic lesion of bone on x-ray - Lytic lesions of calvarium, lower thoracic, lumbar, sacrum - Possible new diagnosis of multiple myeloma -  Consult oncology  Dementia (HCC) - Likely related to Parkinson's - Continue Namenda, Symmetrel - Continue to reorient as needed  Parkinson's disease - Continue Sinemet - Continue Symmetrel  Hypokalemia - 40 mEq potassium given in the ED - Trend in the a.m.  Hyperlipidemia - Continue heart healthy diet       Advance Care Planning:   Code Status: DNR  Consults: Oncology and PT  Family Communication: No family at bedside  Severity of Illness: The appropriate patient status for this patient is OBSERVATION. Observation status is judged to be reasonable and necessary in order to provide the required intensity of service to ensure the patient's safety. The patient's presenting symptoms, physical exam findings, and initial radiographic and laboratory data in the context of their medical condition is felt to place them at decreased risk for further clinical deterioration. Furthermore, it is anticipated that the patient will be medically stable  for discharge from the hospital within 2 midnights of admission.   Author: Lilyan Gilford, DO 09/23/2022 3:12 AM  For on call review [email or web address].

## 2022-09-23 NOTE — Progress Notes (Signed)
Progress Note   Patient: Vanessa Stephens [id number] DOB: [birth year] DOA: 09/22/2022     0 DOS: the patient was seen and examined on 09/23/2022   Brief hospital course: 71yo with h/o DM, Parkinson's with dementia, and HTN who presented on 8/18 with generalized weakness.  +dehydration.  Head CT with lytic lesions in calvarium, MRI L-spine with diffuse bony metastatic disease - possible new multiple myeloma.    Assessment and Plan: * Generalized weakness Patient is normally able to ambulate with a walker On admission, she could not even stand up Dehydration contributing with dry mucous membranes, 23:1.00 BUN/creatinine ratio COVID negative CT head shows metastatic disease with lytic lesions in the calvarium MRI lumbar spine also shows diffuse bony metastatic disease to the lower thoracic, lumbar, and sacrum Will consult oncology, PT/OT  DNR (do not resuscitate) DNR order in ACP documents, reviewed on admission  Lytic lesion of bone on x-ray Imaging with lytic lesions of calvarium, lower thoracic, lumbar, sacrum Possible new diagnosis of multiple myeloma Consult oncology  Dementia [facility]) Likely related to Parkinson's Continue Namenda, Symmetrel Continue to reorient as needed Delirium precautions  Parkinson's disease Continue Sinemet Continue Symmetrel  Hypokalemia 40 mEq potassium given in the ED Still low, continuing to replete Recheck BMP in AM  Hyperlipidemia She does not appear to be taking medications for this issue at this time      Consultants: Oncology [facility] team PT/OT  Procedures: None  Antibiotics: None      Subjective: Reports that she was at home for 4 days with an alternate caregiver, usually her granddaughter but she went on a camping trip.  Has noticed weakness, dizziness, gait instability. +LLE weakness specifically.  She is aware that imaging showed concern for malignancy.   Objective: Vitals:   09/23/22 1400 09/23/22 1450  BP: (!)  171/103 137/66  Pulse: 69 72  Resp:  18  Temp:  97.8 F (36.6 C)  SpO2: 96% 97%    Intake/Output Summary (Last 24 hours) at 09/23/2022 1649 Last data filed at 09/23/2022 0425 Gross per 24 hour  Intake 357.66 ml  Output --  Net 357.66 ml   Filed Weights   09/22/22 0723 09/22/22 2245 09/23/22 1450  Weight: 82.6 kg 72.9 kg 72.2 kg    Exam:  General:  Appears calm and comfortable and is in NAD Eyes:  EOMI, normal lids, iris ENT:  grossly normal hearing, lips & tongue, mmm Neck:  no LAD, masses or thyromegaly Cardiovascular:  RRR, no m/r/g. No LE edema.  Respiratory:   CTA bilaterally with no wheezes/rales/rhonchi.  Normal respiratory effort. Abdomen:  soft, NT, ND Skin:  no rash or induration seen on limited exam; chronic LLE plaque associated with a remote fall, reports unchanged from baseline Musculoskeletal:  L foot weakness, although more proximally strength appears to be intact Psychiatric:  grossly normal mood and affect, speech fluent and appropriate, AOx2-3 Neurologic:  CN 2-12 grossly intact, moves all extremities in coordinated fashion with diminished L pedal strength   Data Reviewed: I have reviewed the patient's lab results since admission.  Pertinent labs for today include:  K+ 3.1 AP 371 Albumin 2.9 Normal CBC     Family Communication: None present - I spoke with her granddaughter by telephone  Disposition: Status is: Inpatient Admit - It is my clinical opinion that admission to INPATIENT is reasonable and necessary because of the expectation that this patient will require hospital care that crosses at least 2 midnights to treat this condition based on  the medical complexity of the problems presented.  Given the aforementioned information, the predictability of an adverse outcome is felt to be significant.   Planned Discharge Destination:  TBD    Time spent: 50 minutes  Author: Jonah Blue, MD 09/23/2022 4:49 PM  For on call review  [email or web address].

## 2022-09-23 NOTE — Assessment & Plan Note (Addendum)
Likely related to Parkinson's Continue Namenda, Symmetrel Continue to reorient as needed Delirium precautions

## 2022-09-23 NOTE — Assessment & Plan Note (Addendum)
Finally normalized this AM Recheck BMP tomorrow AM

## 2022-09-23 NOTE — Plan of Care (Signed)

## 2022-09-23 NOTE — Hospital Course (Signed)
71yo with h/o DM, Parkinson's with dementia, and HTN who presented on 8/18 with generalized weakness.  +dehydration.  Head CT with lytic lesions in calvarium, MRI L-spine with diffuse bony metastatic disease.  She has a prior h/o uterine and breast cancer.  Palliative care has met with patient/family and they are in agreement that she would not want to proceed with further evaluation and treatment of malignancy.  She has declined oncology consultation and will proceed with SNF rehab with the hope of returning home.  She is hospice appropriate but does not appear to need residential hospice at this time.

## 2022-09-23 NOTE — Assessment & Plan Note (Addendum)
Imaging with lytic lesions of calvarium, lower thoracic, lumbar, sacrum Possible new diagnosis of multiple myeloma Consult oncology

## 2022-09-23 NOTE — Progress Notes (Signed)
Initial Nutrition Assessment  DOCUMENTATION CODES:   Not applicable  INTERVENTION:   -Continue Ensure Enlive po BID, each supplement provides 350 kcal and 20 grams of protein -Magic cup BID with meals, each supplement provides 290 kcal and 9 grams of protein  -MVI with minerals daily -Feeding assistance with meals -Liberalize diet to regular for wider variety of meal selections  NUTRITION DIAGNOSIS:   Predicted suboptimal nutrient intake related to chronic illness (dementia) as evidenced by estimated needs.  GOAL:   Patient will meet greater than or equal to 90% of their needs  MONITOR:   PO intake, Supplement acceptance  REASON FOR ASSESSMENT:   Malnutrition Screening Tool    ASSESSMENT:   Pt with medical history significant of cancer, diabetes mellitus, Parkinson's dementia, hypertension, and more presents with a chief complaint of generalized weakness.  Pt admitted with generalized weakness.   Reviewed I/O's: +358 ml x 24 hours  Pt unavailable at time of visit. Attempted to speak with pt via call to hospital room phone, however, unable to reach. RD unable to obtain further nutrition-related history or complete nutrition-focused physical exam at this time.     Per H&P, pt is able to ambulate with a walker at baseline, but was unable to stand upon admission. Pt also with lytic lesions of calvarium, lower thoracic, lumbar, sacrum; concerning for multiple myeloma.   Pt currently on a heart healthy diet. No meal completion data available to assess at this time.   Reviewed wt hx; pt has experienced a 9.7% wt loss over the past 4 months, which is significant for time frame. Pt also with bilateral lower extremity edema, which may be masking true weight loss as well as fat and muscle depletions. Pt is at high risk for malnutrition, however, unable to identify at this time. Pt would greatly benefit from addition of oral nutrition supplements.   Medications reviewed and include  sinemet and 0.9% sodium chloride infusion @ 75 ml/hr.   TOC has been consulted for ethics secondary to care concerns.   Lab Results  Component Value Date   HGBA1C 6.1 (H) 09/25/2020   PTA DM medications are none.   Labs reviewed: K: 3.1, CBGS: 206 (inpatient orders for glycemic control are none).    Diet Order:   Diet Order             Diet Heart Room service appropriate? Yes; Fluid consistency: Thin  Diet effective now                   EDUCATION NEEDS:   No education needs have been identified at this time  Skin:  Skin Assessment: Reviewed RN Assessment  Last BM:  09/22/22  Height:   Ht Readings from Last 1 Encounters:  09/22/22 5\' 4"  (1.626 m)    Weight:   Wt Readings from Last 1 Encounters:  09/22/22 72.9 kg    Ideal Body Weight:  54.5 kg  BMI:  Body mass index is 27.59 kg/m.  Estimated Nutritional Needs:   Kcal:  1650-1850  Protein:  80-95 grams  Fluid:  > 1.6 L    Levada Schilling, RD, LDN, CDCES Registered Dietitian II Certified Diabetes Care and Education Specialist Please refer to [facility] for RD and/or RD on-call/weekend/after hours pager

## 2022-09-23 NOTE — Assessment & Plan Note (Signed)
DNR order in ACP documents, reviewed on admission

## 2022-09-23 NOTE — Assessment & Plan Note (Addendum)
Continue Sinemet Continue Symmetrel

## 2022-09-23 NOTE — Progress Notes (Signed)
   09/23/22 1147  TOC Brief Assessment  Insurance and Status Reviewed  Patient has primary care physician Yes  Home environment has been reviewed Home with family  Prior level of function: Needs assistance  Prior/Current Home Services No current home services  Social Determinants of Health Reivew SDOH reviewed no interventions necessary  Readmission risk has been reviewed Yes  Transition of care needs no transition of care needs at this time   In OBS, for generalized weakness.TOC following, PT eval pending.FL2 started.

## 2022-09-23 NOTE — Assessment & Plan Note (Addendum)
-  She does not appear to be taking medications for this issue at this time

## 2022-09-24 DIAGNOSIS — R531 Weakness: Secondary | ICD-10-CM | POA: Diagnosis not present

## 2022-09-24 DIAGNOSIS — Z7189 Other specified counseling: Secondary | ICD-10-CM

## 2022-09-24 LAB — CBC WITH DIFFERENTIAL/PLATELET
Abs Immature Granulocytes: 0.06 10*3/uL (ref 0.00–0.07)
Basophils Absolute: 0 10*3/uL (ref 0.0–0.1)
Basophils Relative: 1 %
Eosinophils Absolute: 0.1 10*3/uL (ref 0.0–0.5)
Eosinophils Relative: 2 %
HCT: 31.6 % — ABNORMAL LOW (ref 36.0–46.0)
Hemoglobin: 9.5 g/dL — ABNORMAL LOW (ref 12.0–15.0)
Immature Granulocytes: 1 %
Lymphocytes Relative: 13 %
Lymphs Abs: 0.7 10*3/uL (ref 0.7–4.0)
MCH: 24.9 pg — ABNORMAL LOW (ref 26.0–34.0)
MCHC: 30.1 g/dL (ref 30.0–36.0)
MCV: 82.9 fL (ref 80.0–100.0)
Monocytes Absolute: 0.5 10*3/uL (ref 0.1–1.0)
Monocytes Relative: 10 %
Neutro Abs: 3.9 10*3/uL (ref 1.7–7.7)
Neutrophils Relative %: 73 %
Platelets: 178 10*3/uL (ref 150–400)
RBC: 3.81 MIL/uL — ABNORMAL LOW (ref 3.87–5.11)
RDW: 14.5 % (ref 11.5–15.5)
WBC: 5.3 10*3/uL (ref 4.0–10.5)
nRBC: 0 % (ref 0.0–0.2)

## 2022-09-24 LAB — BASIC METABOLIC PANEL
Anion gap: 7 (ref 5–15)
BUN: 15 mg/dL (ref 8–23)
CO2: 19 mmol/L — ABNORMAL LOW (ref 22–32)
Calcium: 7 mg/dL — ABNORMAL LOW (ref 8.9–10.3)
Chloride: 114 mmol/L — ABNORMAL HIGH (ref 98–111)
Creatinine, Ser: 0.5 mg/dL (ref 0.44–1.00)
GFR, Estimated: >60 mL/min (ref >60)
Glucose, Bld: 83 mg/dL (ref 70–99)
Potassium: 2.7 mmol/L — CL (ref 3.5–5.1)
Sodium: 140 mmol/L (ref 135–145)

## 2022-09-24 MED ORDER — POTASSIUM CHLORIDE 10 MEQ/100ML IV SOLN
10.0000 meq | INTRAVENOUS | Status: AC
  Administered 2022-09-24 (×4): 10 meq via INTRAVENOUS
  Filled 2022-09-24 (×4): qty 100

## 2022-09-24 MED ORDER — POTASSIUM CHLORIDE CRYS ER 20 MEQ PO TBCR
40.0000 meq | EXTENDED_RELEASE_TABLET | Freq: Once | ORAL | Status: AC
  Administered 2022-09-24: 40 meq via ORAL
  Filled 2022-09-24: qty 2

## 2022-09-24 NOTE — TOC Initial Note (Signed)
Transition of Care [facility]) - Initial/Assessment Note    Patient Details  Name: Vanessa Stephens MRN: [id number] Date of Birth: [birth year]  [facility] CM/SW Contact:    Leitha Bleak, RN Phone Number: 09/24/2022, 3:41 PM  Clinical Narrative:    Patient admitted with generalized weakness. Lives a home with family assistance. Caregiver was gone this past week. Per daughter she usually manages in the home with cane or walker. Now she is not ambulatory due to pain. PT is recommending SNF. They are agreeable a requested [facility]. FL2 completed and sent out.           Expected Discharge Plan: Skilled Nursing Facility Barriers to Discharge: Continued Medical Work up   Patient Goals and CMS Choice Patient states their goals for this hospitalization and ongoing recovery are:: agreeable to SNF CMS Medicare.gov Compare Post Acute Care list provided to:: Patient Represenative (must comment) Choice offered to / list presented to : Adult Children     Expected Discharge Plan and Services    Prior Living Arrangements/Services     Patient language and need for interpreter reviewed:: Yes        Need for Family Participation in Patient Care: Yes (Comment) Care giver support system in place?: Yes (comment)   Criminal Activity/Legal Involvement Pertinent to Current Situation/Hospitalization: No - Comment as needed  Activities of Daily Living Home Assistive Devices/Equipment: Shower chair with back, Walker (specify type) ADL Screening (condition at time of admission) Patient's cognitive ability adequate to safely complete daily activities?: Yes Is the patient deaf or have difficulty hearing?: No Does the patient have difficulty seeing, even when wearing glasses/contacts?: No Does the patient have difficulty concentrating, remembering, or making decisions?: No Patient able to express need for assistance with ADLs?: Yes Does the patient have difficulty dressing or bathing?:  No Independently performs ADLs?: No Communication: Independent Dressing (OT): Independent Grooming: Independent Feeding: Independent Bathing: Independent with device (comment) Toileting: Independent with device (comment) In/Out Bed: Independent with device (comment) Walks in Home: Independent with device (comment) Does the patient have difficulty walking or climbing stairs?: Yes Weakness of Legs: Both Weakness of Arms/Hands: None  Permission Sought/Granted      Permission granted to share info w Relationship: Daughter    Emotional Assessment     Affect (typically observed): Accepting Orientation: : Oriented to Self, Oriented to  Time, Oriented to Place Alcohol / Substance Use: Not Applicable Psych Involvement: No (comment)  Admission diagnosis:  Generalized weakness [R53.1] Gait instability [R26.81] Patient Active Problem List   Diagnosis Date Noted   Goals of care, counseling/discussion 09/24/2022   Parkinson's disease 09/23/2022   Dementia (HCC) 09/23/2022   Lytic lesion of bone on x-ray 09/23/2022   Gait instability 09/23/2022   DNR (do not resuscitate) 09/23/2022   Hypertension associated with type 2 diabetes mellitus (HCC) 10/02/2020   Hyperlipidemia associated with type 2 diabetes mellitus (HCC) 10/02/2020   Hypokalemia 10/02/2020   Chronic constipation 10/02/2020   Urinary incontinence with continuous leakage 10/02/2020   Spinal stenosis of lumbosacral region/Severe canal stenosis at L2-L3 and moderate-to-severe canal stenosis at L3-L4/ 09/28/2020   Generalized weakness 09/24/2020   Hyperlipidemia 09/24/2020   Diabetes mellitus type 2 in obese 09/24/2020   Acute lower UTI due to E Coli 09/24/2020   PCP:  Ignatius Specking, MD Pharmacy:   [facility] - [place], [place] - [address] [place] [address] Phone: [phone] Fax: [phone]     Social Determinants  of Health (SDOH) Social History: SDOH Screenings   Food Insecurity:  No Food Insecurity (09/22/2022)  Housing: Patient Declined (09/22/2022)  Transportation Needs: No Transportation Needs (09/22/2022)  Utilities: Not At Risk (09/22/2022)  Financial Resource Strain: Low Risk  (10/02/2021)   [facility] from [facility]  Tobacco Use: Medium Risk (09/22/2022)   SDOH Interventions:   Readmission Risk Interventions    09/24/2022    3:37 PM  Readmission Risk Prevention Plan  Transportation Screening Complete  PCP or Specialist Appt within 5-7 Days Not Complete  Home Care Screening Complete  Medication Review (RN CM) Complete

## 2022-09-24 NOTE — Assessment & Plan Note (Signed)
She is DNR, ACP documents present Based on advancing dementia, GOC conversation was initiated with her granddaughter Granddaughter thinks that the patient would not want further evaluation and treatment and would not want to know about underlying malignancy Held pulm/IR/oncology consults  GOC conversation held with palliative care team Patient/family decline further evaluation and treatment Outpatient palliative care vs. Transition to hospice (outpatient)

## 2022-09-24 NOTE — Progress Notes (Signed)
Progress Note   Patient: Vanessa Stephens ZOX:[id number] DOB: [birth year] DOA: 09/22/2022     1 DOS: the patient was seen and examined on 09/24/2022   Brief hospital course: 71yo with h/o DM, Parkinson's with dementia, and HTN who presented on 8/18 with generalized weakness.  +dehydration.  Head CT with lytic lesions in calvarium, MRI L-spine with diffuse bony metastatic disease - possible new multiple myeloma.    Assessment and Plan: * Generalized weakness Patient is normally able to ambulate with a walker On admission, she could not even stand up Dehydration contributing with dry mucous membranes, 23:1.00 BUN/creatinine ratio COVID negative CT head shows metastatic disease with lytic lesions in the calvarium MRI lumbar spine also shows diffuse bony metastatic disease to the lower thoracic, lumbar, and sacrum Will consult PT/OT  Goals of care, counseling/discussion She is DNR, ACP documents present Based on advancing dementia, GOC conversation was initiated with her granddaughter Granddaughter thinks that the patient would not want further evaluation and treatment and would not want to know about underlying malignancy As such, will hold pulm/IR/oncology consults for now and await GOC conversation with palliative care team  DNR (do not resuscitate) DNR order in ACP documents, reviewed on admission  Lytic lesion of bone on x-ray Imaging with lytic lesions of calvarium, lower thoracic, lumbar, sacrum Possible new diagnosis of multiple myeloma but metastatic disease appears more likely currently Discussed with oncology and she would need a tissue biopsy as the next step - either bronch with tissue diagnosis or bone biopsy with IR Either of these options would require evaluation in [place], inpatient vs. Outpatient Patient was d/w Drs. Smith and Icard from pulm, who are willing to do virtual evaluation and/or inpatient/outpatient evaluation as needed Discussed GOC with family (see  below) - will hold further evaluation pending palliative care consult   Dementia (HCC) Likely related to Parkinson's Continue Namenda, Symmetrel Continue to reorient as needed Delirium precautions  Parkinson's disease Continue Sinemet Continue Symmetrel  Hypokalemia 40 mEq potassium given in the ED Still low, continuing to replete Recheck BMP in AM  Hyperlipidemia She does not appear to be taking medications for this issue at this time           Consultants: Oncology [facility] team PT/OT   Procedures: None   Antibiotics: None  30 Day Unplanned Readmission Risk Score    Flowsheet Row ED to Hosp-Admission (Current) from 09/22/2022 in [facility]  30 Day Unplanned Readmission Risk Score (%) 15.74 Filed at 09/24/2022 0401       This score is the patient's risk of an unplanned readmission within 30 days of being discharged (0 -100%). The score is based on dignosis, age, lab data, medications, orders, and past utilization.   Low:  0-14.9   Medium: 15-21.9   High: 22-29.9   Extreme: 30 and above           Subjective: The patient has little to say today, reports being somnolent.  She does not currently remember why she is at the hospital.  She acknowledges chronic L foot drop.    I spoke with her granddaughter.  She previously had breast cancer and lymph nodes removed previously, maybe twice.  Last week, she was able to go to the bathroom by herself, brought her in because her legs stopped moving.  She has a chronic left foot drop.  She does not think she would want treatment.     Objective: Vitals:   09/24/22 8119 09/24/22 1478  BP: (!) 185/73 (!) 157/103  Pulse: 76 76  Resp: 18 18  Temp: 98.4 F (36.9 C) 99.6 F (37.6 C)  SpO2: 99% 95%   No intake or output data in the 24 hours ending 09/24/22 1244 Filed Weights   09/22/22 0723 09/22/22 2245 09/23/22 1450  Weight: 82.6 kg 72.9 kg 72.2 kg    Exam:  General:  Appears calm and  comfortable and is in NAD, somnolent today Eyes:  EOMI, normal lids, iris ENT:  grossly normal hearing, lips & tongue, mmm Neck:  no LAD, masses or thyromegaly Cardiovascular:  RRR, no m/r/g. No LE edema.  Respiratory:   CTA bilaterally with no wheezes/rales/rhonchi.  Normal respiratory effort. Abdomen:  soft, NT, ND Skin:  no rash or induration seen on limited exam; chronic LLE plaque associated with a remote fall, unchanged from yesterday Musculoskeletal:  L foot weakness with foot drop, reports this is chronic Psychiatric:  blunted and confused mood and affect, speech sparse but appropriate, AOx1 Neurologic:  CN 2-12 grossly intact, moves all extremities in coordinated fashion with diminished L pedal strength   Data Reviewed: I have reviewed the patient's lab results since admission.  Pertinent labs for today include:  K+ 2.7 CO2 19 Calcium 7 WBC 5.3 Hgb 9.5, 12 on 8/19     Family Communication: I spoke with her granddaughter by telephone  Disposition: Status is: Inpatient Remains inpatient appropriate because: determining goals of care  Planned Discharge Destination:  TBD    Time spent: 50 minutes  Author: Jonah Blue, MD 09/24/2022 12:44 PM  For on call review [email or web address].

## 2022-09-24 NOTE — Evaluation (Signed)
Physical Therapy Evaluation Patient Details Name: Vanessa Stephens MRN: [id number] DOB: [birth year] Today's Date: 09/24/2022  History of Present Illness  per MD:" Vanessa Stephens is a 72 y.o. female with medical history significant of cancer, diabetes mellitus, Parkinson's dementia, hypertension, and more presents the ED with a chief complaint of generalized weakness.  Patient reports that she fell down a couple of days ago and hurt her right leg.  She reports it has been healing well.  And she thinks that is why she is in the hospital.  When reminded that she was weak today, she reports that she remembers not being able to stand up.  She reports she has not been eating or drinking at home.  She says it is because she has not had any appetite.  She also reports that her caretaker had left for a week.  She said just friends and people were helping her during the week that her caretaker was gone, but she is very vague about it.  Patient denies any chest pain, nausea, vomiting, diarrhea, dysuria, fever, dyspnea.  She is normally able to ambulate with a walker and today could not even stand up.  Patient has no other complaints at this time."  Clinical Impression  PT frustrated that she can not get her body to do what she wants it to.  PT has fair - sitting balance.  Explained to pt that if she can not sit without losing her balance she can not stand; therefore evaluation focused on sitting balance.       If plan is discharge home, recommend the following: A lot of help with walking and/or transfers;A little help with bathing/dressing/bathroom;Assistance with cooking/housework;Direct supervision/assist for financial management;Assist for transportation   Can travel by private vehicle   No    Equipment Recommendations None recommended by PT  Recommendations for Other Services       Functional Status Assessment Patient has had a recent decline in their functional status and/or demonstrates limited  ability to make significant improvements in function in a reasonable and predictable amount of time     Precautions / Restrictions Precautions Precautions: Fall Restrictions Weight Bearing Restrictions: No      Mobility  Bed Mobility Overal bed mobility: Needs Assistance Bed Mobility: Sidelying to Sit   Sidelying to sit: Min assist            Transfers Overall transfer level: Needs assistance   Transfers: Sit to/from Stand Sit to Stand: Max assist                Ambulation/Gait   Gait Distance (Feet): 0 Feet              Balance Overall balance assessment: Needs assistance Sitting-balance support: Bilateral upper extremity supported Sitting balance-Leahy Scale: Poor Sitting balance - Comments: PT continuously falling backward.   Standing balance support: Single extremity supported   Standing balance comment: not tested                             Pertinent Vitals/Pain Pain Assessment Pain Assessment: No/denies pain    Home Living Family/patient expects to be discharged to:: Private residence Living Arrangements: Children Available Help at Discharge: Family;Available 24 hours/day Type of Home: House Home Access: Stairs to enter Entrance Stairs-Rails: Doctor, general practice of Steps: 10   Home Layout: One level Home Equipment: Agricultural consultant (2 wheels);Rollator (4 wheels)      Prior Function Prior Level  of Function : Needs assist       Physical Assist : Mobility (physical) Mobility (physical): Bed mobility;Gait;Stairs           Extremity/Trunk Assessment        Lower Extremity Assessment Lower Extremity Assessment: Generalized weakness       Communication   Communication Communication: No apparent difficulties Cueing Techniques: Verbal cues  Cognition Arousal: Alert Behavior During Therapy: WFL for tasks assessed/performed                                            General  Comments      Exercises General Exercises - Lower Extremity Ankle Circles/Pumps: Both, 10 reps Long Arc Quad: Both, 5 reps Heel Slides: Both, 10 reps   Assessment/Plan    PT Assessment Patient needs continued PT services  PT Problem List Decreased strength;Decreased activity tolerance;Decreased balance;Decreased mobility       PT Treatment Interventions Balance training;Gait training;Therapeutic exercise    PT Goals (Current goals can be found in the Care Plan section)       Frequency Min 2X/week        AM-PAC PT "6 Clicks" Mobility  Outcome Measure Help needed turning from your back to your side while in a flat bed without using bedrails?: A Little Help needed moving from lying on your back to sitting on the side of a flat bed without using bedrails?: A Little Help needed moving to and from a bed to a chair (including a wheelchair)?: A Lot Help needed standing up from a chair using your arms (e.g., wheelchair or bedside chair)?: A Lot Help needed to walk in hospital room?: A Lot Help needed climbing 3-5 steps with a railing? : A Lot 6 Click Score: 14    End of Session Equipment Utilized During Treatment: Gait belt Activity Tolerance: Patient tolerated treatment well Patient left: in bed Nurse Communication: Mobility status PT Visit Diagnosis: Unsteadiness on feet (R26.81);Muscle weakness (generalized) (M62.81)    Time: 4259-5638 PT Time Calculation (min) (ACUTE ONLY): 32 min   Charges:   PT Evaluation $PT Eval Low Complexity: 1 Low   PT General Charges $$ ACUTE PT VISIT: 1 Visit    Virgina Organ, PT CLT [phone]  09/24/2022, 2:17 PM

## 2022-09-24 NOTE — [place] FL2 (Signed)
[place] MEDICAID FL2 LEVEL OF CARE FORM     IDENTIFICATION  Patient Name: Vanessa Stephens Birthdate: 03-20-1950 Sex: female Admission Date (Current Location): 09/22/2022  [facility] and [place] Number:  [employer] and Address:  [facility],  [address], [place] [address]      Provider Number: [id number]  Attending Physician Name and Address:  Jonah Blue, MD  Relative Name and Phone Number:  Danae Orleans (Daughter)  [phone]    Current Level of Care: Hospital Recommended Level of Care: Skilled Nursing Facility Prior Approval Number:    Date Approved/Denied:   PASRR Number: [id number]  Discharge Plan: SNF    Current Diagnoses: Patient Active Problem List   Diagnosis Date Noted   Goals of care, counseling/discussion 09/24/2022   Parkinson's disease 09/23/2022   Dementia (HCC) 09/23/2022   Lytic lesion of bone on x-ray 09/23/2022   Gait instability 09/23/2022   DNR (do not resuscitate) 09/23/2022   Hypertension associated with type 2 diabetes mellitus (HCC) 10/02/2020   Hyperlipidemia associated with type 2 diabetes mellitus (HCC) 10/02/2020   Hypokalemia 10/02/2020   Chronic constipation 10/02/2020   Urinary incontinence with continuous leakage 10/02/2020   Spinal stenosis of lumbosacral region/Severe canal stenosis at L2-L3 and moderate-to-severe canal stenosis at L3-L4/ 09/28/2020   Generalized weakness 09/24/2020   Hyperlipidemia 09/24/2020   Diabetes mellitus type 2 in obese 09/24/2020   Acute lower UTI due to E Coli 09/24/2020    Orientation RESPIRATION BLADDER Height & Weight     Self, Time, Situation, Place  Normal Continent Weight: 72.2 kg Height:  5\' 4"  (162.6 cm)  BEHAVIORAL SYMPTOMS/MOOD NEUROLOGICAL BOWEL NUTRITION STATUS      Continent Diet (See DC summary)  AMBULATORY STATUS COMMUNICATION OF NEEDS Skin   Extensive Assist Verbally Bruising, Skin abrasions                       Personal Care  Assistance Level of Assistance  Bathing, Feeding, Dressing Bathing Assistance: Maximum assistance Feeding assistance: Limited assistance Dressing Assistance: Maximum assistance     Functional Limitations Info  Sight, Hearing, Speech Sight Info: Adequate Hearing Info: Adequate Speech Info: Adequate    SPECIAL CARE FACTORS FREQUENCY  PT (By licensed PT)     PT Frequency: 5 times a week              Contractures Contractures Info: Not present    Additional Factors Info  Code Status, Allergies Code Status Info: DNR Allergies Info: Codeine           Current Medications (09/24/2022):  This is the current hospital active medication list Current Facility-Administered Medications  Medication Dose Route Frequency Provider Last Rate Last Admin   0.9 %  sodium chloride infusion   Intravenous Continuous Zierle-Ghosh, Asia B, DO 75 mL/hr at 09/23/22 2253 New Bag at 09/23/22 2253   acetaminophen (TYLENOL) tablet 650 mg  650 mg Oral Q6H PRN Zierle-Ghosh, Asia B, DO   650 mg at 09/24/22 1049   Or   acetaminophen (TYLENOL) suppository 650 mg  650 mg Rectal Q6H PRN Zierle-Ghosh, Asia B, DO       amantadine (SYMMETREL) capsule 100 mg  100 mg Oral BID Zierle-Ghosh, Asia B, DO   100 mg at 09/24/22 1037   carbidopa-levodopa (SINEMET IR) 25-100 MG per tablet immediate release 1 tablet  1 tablet Oral QID Zierle-Ghosh, Asia B, DO   1 tablet at 09/24/22 1449   Chlorhexidine Gluconate Cloth 2 %  PADS 6 each  6 each Topical Daily Zierle-Ghosh, Asia B, DO   6 each at 09/23/22 0952   DULoxetine (CYMBALTA) DR capsule 30 mg  30 mg Oral BID Zierle-Ghosh, Asia B, DO   30 mg at 09/24/22 1037   feeding supplement (ENSURE ENLIVE / ENSURE PLUS) liquid 237 mL  237 mL Oral BID BM Zierle-Ghosh, Asia B, DO   237 mL at 09/23/22 1419   haloperidol lactate (HALDOL) injection 2-5 mg  2-5 mg Intravenous Q6H PRN Jonah Blue, MD   5 mg at 09/23/22 1848   heparin injection 5,000 Units  5,000 Units Subcutaneous Q8H  Zierle-Ghosh, Asia B, DO   5,000 Units at 09/24/22 1450   memantine (NAMENDA) tablet 10 mg  10 mg Oral BID Zierle-Ghosh, Asia B, DO   10 mg at 09/24/22 1037   metoprolol tartrate (LOPRESSOR) tablet 50 mg  50 mg Oral Daily Zierle-Ghosh, Asia B, DO   50 mg at 09/24/22 1037   morphine (PF) 2 MG/ML injection 2 mg  2 mg Intravenous Q2H PRN Zierle-Ghosh, Asia B, DO   2 mg at 09/23/22 2236   multivitamin with minerals tablet 1 tablet  1 tablet Oral Daily Jonah Blue, MD   1 tablet at 09/24/22 1037   ondansetron (ZOFRAN) tablet 4 mg  4 mg Oral Q6H PRN Zierle-Ghosh, Asia B, DO       Or   ondansetron (ZOFRAN) injection 4 mg  4 mg Intravenous Q6H PRN Zierle-Ghosh, Asia B, DO       oxyCODONE (Oxy IR/ROXICODONE) immediate release tablet 5 mg  5 mg Oral Q4H PRN Zierle-Ghosh, Asia B, DO   5 mg at 09/23/22 2952     Discharge Medications: Please see discharge summary for a list of discharge medications.  Relevant Imaging Results:  Relevant Lab Results:   Additional Information SS# [id number]  Leitha Bleak, RN

## 2022-09-25 ENCOUNTER — Encounter (HOSPITAL_COMMUNITY): Payer: Self-pay | Admitting: Internal Medicine

## 2022-09-25 DIAGNOSIS — Z515 Encounter for palliative care: Secondary | ICD-10-CM

## 2022-09-25 DIAGNOSIS — R531 Weakness: Secondary | ICD-10-CM | POA: Diagnosis not present

## 2022-09-25 DIAGNOSIS — Z7189 Other specified counseling: Secondary | ICD-10-CM | POA: Diagnosis not present

## 2022-09-25 DIAGNOSIS — G20A1 Parkinson's disease without dyskinesia, without mention of fluctuations: Secondary | ICD-10-CM | POA: Diagnosis not present

## 2022-09-25 LAB — CBC WITH DIFFERENTIAL/PLATELET
Abs Immature Granulocytes: 0.11 10*3/uL — ABNORMAL HIGH (ref 0.00–0.07)
Basophils Absolute: 0.1 10*3/uL (ref 0.0–0.1)
Basophils Relative: 1 %
Eosinophils Absolute: 0.2 10*3/uL (ref 0.0–0.5)
Eosinophils Relative: 4 %
HCT: 36.3 % (ref 36.0–46.0)
Hemoglobin: 10.8 g/dL — ABNORMAL LOW (ref 12.0–15.0)
Immature Granulocytes: 2 %
Lymphocytes Relative: 18 %
Lymphs Abs: 1.1 10*3/uL (ref 0.7–4.0)
MCH: 24.8 pg — ABNORMAL LOW (ref 26.0–34.0)
MCHC: 29.8 g/dL — ABNORMAL LOW (ref 30.0–36.0)
MCV: 83.4 fL (ref 80.0–100.0)
Monocytes Absolute: 0.6 10*3/uL (ref 0.1–1.0)
Monocytes Relative: 9 %
Neutro Abs: 4.3 10*3/uL (ref 1.7–7.7)
Neutrophils Relative %: 66 %
Platelets: 212 10*3/uL (ref 150–400)
RBC: 4.35 MIL/uL (ref 3.87–5.11)
RDW: 14.3 % (ref 11.5–15.5)
WBC: 6.4 10*3/uL (ref 4.0–10.5)
nRBC: 0 % (ref 0.0–0.2)

## 2022-09-25 LAB — BASIC METABOLIC PANEL
Anion gap: 9 (ref 5–15)
BUN: 13 mg/dL (ref 8–23)
CO2: 21 mmol/L — ABNORMAL LOW (ref 22–32)
Calcium: 8.5 mg/dL — ABNORMAL LOW (ref 8.9–10.3)
Chloride: 107 mmol/L (ref 98–111)
Creatinine, Ser: 0.64 mg/dL (ref 0.44–1.00)
GFR, Estimated: >60 mL/min (ref >60)
Glucose, Bld: 85 mg/dL (ref 70–99)
Potassium: 3.9 mmol/L (ref 3.5–5.1)
Sodium: 137 mmol/L (ref 135–145)

## 2022-09-25 MED ORDER — HYDRALAZINE HCL 20 MG/ML IJ SOLN
5.0000 mg | INTRAMUSCULAR | Status: DC | PRN
  Administered 2022-09-25 – 2022-09-26 (×2): 5 mg via INTRAVENOUS
  Filled 2022-09-25 (×2): qty 1

## 2022-09-25 NOTE — TOC Progression Note (Signed)
Transition of Care [facility]) - Progression Note    Patient Details  Name: Vanessa Stephens MRN: [id number] Date of Birth: 1950/10/03  Transition of Care [facility]) CM/SW Contact  Leitha Bleak, RN Phone Number: 09/25/2022, 1:49 PM  Clinical Narrative:   Discussed bed offer with grand daughter. They accepted [facility] offer. TOC starting INS AUTH.  Palliative consulted and recommending Palliative services. Family wants [facility]. Referral sent, Destiny at [facility] updated.    Expected Discharge Plan: Skilled Nursing Facility Barriers to Discharge: Continued Medical Work up  Expected Discharge Plan and Services      Social Determinants of Health (SDOH) Interventions SDOH Screenings   Food Insecurity: No Food Insecurity (09/22/2022)  Housing: Patient Declined (09/22/2022)  Transportation Needs: No Transportation Needs (09/22/2022)  Utilities: Not At Risk (09/22/2022)  Financial Resource Strain: Low Risk  (10/02/2021)   Received from [facility]  Tobacco Use: Medium Risk (09/25/2022)    Readmission Risk Interventions    09/24/2022    3:37 PM  Readmission Risk Prevention Plan  Transportation Screening Complete  PCP or Specialist Appt within 5-7 Days Not Complete  Home Care Screening Complete  Medication Review (RN CM) Complete

## 2022-09-25 NOTE — Consult Note (Signed)
Consultation Note Date: 09/25/2022   Patient Name: Vanessa Stephens  DOB: January 31, 1951  MRN: [id number]  Age / Sex: 72 y.o., female  PCP: Ignatius Specking, MD Referring Physician: Jonah Blue, MD  Reason for Consultation: Establishing goals of care  HPI/Patient Profile: 72 y.o. female  with past medical history of Parkinson's with dementia, DM, HTN,, history of lumpectomy of the breast, cholecystectomy, back surgery admitted on 09/22/2022 with generalized weakness.   Clinical Assessment and Goals of Care: I have reviewed medical records including EPIC notes, labs and imaging, received report from RN, assessed the patient.  Mrs. Prevett is sitting up in the [place] chair in her room.  She appears older than stated age, chronically ill.  She is in no apparent distress.  She greets me, making and mostly keeping eye contact.  Although she has Parkinson's dementia, she is oriented x 3.  I believe that she is able to make her needs known.  Her granddaughter/second HCPOA, Maralyn Sago, is present at bedside along with serous 60 year old son, Aiden.  We meet at the bedside to discuss diagnosis prognosis, GOC, EOL wishes, disposition and options.  I introduced Palliative Medicine as specialized medical care for people living with serious illness. It focuses on providing relief from the symptoms and stress of a serious illness. The goal is to improve quality of life for both the patient and the family.  We discussed a brief life review of the patient.  Mrs. Byrdsong is a widow.  She has 1 child, daughter Lelon Mast.  She shares that she worked for [employer].  We then focused on their current illness.  Mrs. Battistoni shares her decline in functional status over the last few months in particular.  She shares that she would not wish Parkinson's on anyone.  We talk about the goal to short-term rehab and her ultimate goal of  returning home.  I ask if she would want further workup for suspected bone lesions.  She tells me, "I just want to be able to walk".  The natural disease trajectory and expectations at EOL were discussed.  Call to daughter, first [place], Danae Orleans.  She shares that Mrs. Schluter has felt cancer twice, uterine and breast.  She states that her mother does not want to do any workup for cancer, she does not want to fight cancer again.  She states that she is on the way to ask her mother what she wants to do with her remaining time, visit family, see the ocean.  Lelon Mast is a also agreeable to outpatient palliative services transitioning to hospice when appropriate.  They continue to be agreeable to short-term rehab with the ultimate goal of returning home.  Advanced directives, concepts specific to code status, artifical feeding and hydration, and rehospitalization were not discussed today as Mrs. Faudree is known DNR with DNR scanned into the ACP tab of epic chart.  Hospice and Palliative Care services outpatient were explained and offered.  We talk about the benefits of outpatient palliative services for support and  care.  We also talk about the benefits of transitioning to hospice care when appropriate.  Provider choice offered.  They choose [facility].  Discussed the importance of continued conversation with family and the medical providers regarding overall plan of care and treatment options, ensuring decisions are within the context of the patient's values and GOCs.  Questions and concerns were addressed.  The patient and family were encouraged to call with questions or concerns.  PMT will continue to support holistically.  Conference with attending, oncology, bedside nursing staff, transition of care team related to patient condition, needs, goals of care, disposition.   HCPOA HCPOA -daughter Danae Orleans and granddaughter Collier Bullock.    SUMMARY OF  RECOMMENDATIONS   At this point continue to treat the treatable but no CPR or intubation NO FURTHER CANCER WORK UP/TREATMENTS Short-term rehab with ultimate goal of returning home Outpatient palliative services, provider choice offered, [facility] Transitioning to hospice care when appropriate, after rehab   Code Status/Advance Care Planning: DNR -goldenrod form in ACP tab of epic chart.  Symptom Management:  Per hospitalist, no additional needs at this time.  Palliative Prophylaxis:  Frequent Pain Assessment and Oral Care  Additional Recommendations (Limitations, Scope, Preferences): Continue to treat but no CPR or intubation.  Psycho-social/Spiritual:  Desire for further Chaplaincy support:no Additional Recommendations: Caregiving  Support/Resources and Education on Hospice  Prognosis:  Unable to determine, based on outcomes.  6 months or less would not be surprising based on chronic illness burden, decreasing functional status, metastatic bony cancers.  Discharge Planning: Anticipate short-term rehab with ultimate goal of returning home.  Outpatient palliative services with [facility]      Primary Diagnoses: Present on Admission:  Parkinson's disease  Dementia (HCC)  Hypokalemia  (Resolved) Heart murmur  Hyperlipidemia  Lytic lesion of bone on x-ray  DNR (do not resuscitate)   I have reviewed the medical record, interviewed the patient and family, and examined the patient. The following aspects are pertinent.  Past Medical History:  Diagnosis Date   Cancer (HCC)    Diabetes mellitus without complication (HCC)    Hypertension    Social History   Socioeconomic History   Marital status: Married    Spouse name: Not on file   Number of children: Not on file   Years of education: Not on file   Highest education level: Not on file  Occupational History   Not on file  Tobacco Use   Smoking status: Former    Types: Cigarettes   Smokeless tobacco: Never  Vaping Use    Vaping status: Never Used  Substance and Sexual Activity   Alcohol use: Not Currently   Drug use: Never   Sexual activity: Not on file  Other Topics Concern   Not on file  Social History Narrative   Not on file   Social Determinants of Health   Financial Resource Strain: Low Risk  (10/02/2021)   [facility]   Overall Financial Resource Strain (CARDIA)    Difficulty of Paying Living Expenses: Not hard at all  Food Insecurity: No Food Insecurity (09/22/2022)   Hunger Vital Sign    Worried About Running Out of Food in the Last Year: Never true    Ran Out of Food in the Last Year: Never true  Transportation Needs: No Transportation Needs (09/22/2022)   PRAPARE - Administrator, Civil Service (Medical): No    Lack of Transportation (Non-Medical): No  Physical  Activity: Not on file  Stress: Not on file  Social Connections: Not on file   History reviewed. No pertinent family history. Scheduled Meds:  amantadine  100 mg Oral BID   carbidopa-levodopa  1 tablet Oral QID   DULoxetine  30 mg Oral BID   feeding supplement  237 mL Oral BID BM   heparin  5,000 Units Subcutaneous Q8H   memantine  10 mg Oral BID   metoprolol tartrate  50 mg Oral Daily   multivitamin with minerals  1 tablet Oral Daily   Continuous Infusions:  sodium chloride 75 mL/hr at 09/25/22 1011   PRN Meds:.acetaminophen **OR** acetaminophen, haloperidol lactate, morphine injection, ondansetron **OR** ondansetron (ZOFRAN) IV, oxyCODONE Medications Prior to Admission:  Prior to Admission medications   Medication Sig Start Date End Date Taking? Authorizing Provider  amantadine (SYMMETREL) 100 MG capsule Take 1 capsule (100 mg total) by mouth 2 (two) times daily. 06/18/22  Yes Penumalli, Glenford Bayley, MD  carbidopa-levodopa (SINEMET IR) 25-100 MG tablet Take 1 tablet by mouth 4 (four) times daily. 05/22/22  Yes Penumalli, Glenford Bayley, MD  cefUROXime (CEFTIN) 500 MG tablet Take 500 mg  by mouth 2 (two) times daily. 09/19/22  Yes [provider]  cyanocobalamin (VITAMIN B12) 1000 MCG tablet Take 1,000 mcg by mouth daily.   Yes [provider]  DULoxetine (CYMBALTA) 30 MG capsule Take 60 mg by mouth daily. 05/20/22  Yes [provider]  memantine (NAMENDA) 10 MG tablet Take 1 tablet (10 mg total) by mouth 2 (two) times daily. 06/17/22  Yes Penumalli, Glenford Bayley, MD  metoprolol tartrate (LOPRESSOR) 50 MG tablet Take 1 tablet by mouth daily.   Yes [provider]  Multiple Vitamin (MULTIVITAMIN) tablet Take 1 tablet by mouth daily.   Yes [provider]   Allergies  Allergen Reactions   Codeine Nausea And Vomiting    Patient stated she was not allergic to codeine anymore    Review of Systems  Unable to perform ROS: Dementia    Physical Exam Vitals and nursing note reviewed.  Constitutional:      General: She is not in acute distress.    Appearance: She is ill-appearing.  HENT:     Mouth/Throat:     Mouth: Mucous membranes are moist.  Cardiovascular:     Rate and Rhythm: Normal rate.  Pulmonary:     Effort: Pulmonary effort is normal. No respiratory distress.  Neurological:     Mental Status: She is alert and oriented to person, place, and time.     Comments: Oriented x 3, but with known memory loss  Psychiatric:        Mood and Affect: Mood normal.        Behavior: Behavior normal.     Vital Signs: BP (!) 157/70 (BP Location: Left Arm)   Pulse 80   Temp 98.4 F (36.9 C)   Resp 18   Ht 5\' 4"  (1.626 m)   Wt 72.2 kg   SpO2 99%   BMI 27.32 kg/m  Pain Scale: 0-10   Pain Score: Asleep   SpO2: SpO2: 99 % O2 Device:SpO2: 99 % O2 Flow Rate: .   IO: Intake/output summary:  Intake/Output Summary (Last 24 hours) at 09/25/2022 1249 Last data filed at 09/25/2022 1011 Gross per 24 hour  Intake 3452.46 ml  Output 1500 ml  Net 1952.46 ml    LBM: Last BM Date : 09/24/22 Baseline Weight: Weight: 82.6 kg Most recent  weight: Weight: 72.2  kg     Palliative Assessment/Data:     Time In: 0930 Time Out: 1045 Time Total: 75 minutes  Greater than 50%  of this time was spent counseling and coordinating care related to the above assessment and plan.  Signed by: Katheran Awe, NP   Please contact Palliative Medicine Team phone at [phone] for questions and concerns.  For individual provider: See Loretha Stapler

## 2022-09-25 NOTE — Progress Notes (Signed)
Progress Note   Patient: Vanessa Stephens WGN:[id number] DOB: 1950-07-04 DOA: 09/22/2022     2 DOS: the patient was seen and examined on 09/25/2022   Brief hospital course: 72yo with h/o DM, Parkinson's with dementia, and HTN who presented on 8/18 with generalized weakness.  +dehydration.  Head CT with lytic lesions in calvarium, MRI L-spine with diffuse bony metastatic disease.  She has a prior h/o uterine and breast cancer.  Palliative care has met with patient/family and they are in agreement that she would not want to proceed with further evaluation and treatment of malignancy.  She has declined oncology consultation and will proceed with SNF rehab with the hope of returning home.  She is hospice appropriate but does not appear to need residential hospice at this time.  Assessment and Plan: * Generalized weakness Patient is normally able to ambulate with a walker On admission, she could not even stand up COVID negative CT head shows metastatic disease with lytic lesions in the calvarium MRI lumbar spine also shows diffuse bony metastatic disease to the lower thoracic, lumbar, and sacrum PT/OT consulted and recommend SNF rehab She has been accepted to [facility] rehab and is awaiting insurance approval and then will dc  Goals of care, counseling/discussion She is DNR, ACP documents present Based on advancing dementia, GOC conversation was initiated with her granddaughter Granddaughter thinks that the patient would not want further evaluation and treatment and would not want to know about underlying malignancy Held pulm/IR/oncology consults  GOC conversation held with palliative care team Patient/family decline further evaluation and treatment Outpatient palliative care vs. Transition to hospice (outpatient)  DNR (do not resuscitate) DNR order in ACP documents, reviewed on admission  Lytic lesion of bone on x-ray Imaging with lytic lesions of calvarium, lower thoracic, lumbar,  sacrum Possible new diagnosis of multiple myeloma but metastatic disease appears more likely  Discussed with oncology and she would need a tissue biopsy as the next step - either bronch with tissue diagnosis or bone biopsy with IR Either of these options would require evaluation in [place], inpatient vs. Outpatient Patient was d/w Drs. Smith and Icard from pulm, who are willing to do virtual evaluation and/or inpatient/outpatient evaluation as needed Discussed GOC with family  Family does not want to pursue further testing at this time, declined oncology evaluation    Dementia (HCC) Likely related to Parkinson's Continue Namenda, Symmetrel Continue to reorient as needed Delirium precautions  Parkinson's disease Continue Sinemet Continue Symmetrel  Hypokalemia Finally normalized this AM Recheck BMP tomorrow AM  Hyperlipidemia She does not appear to be taking medications for this issue at this time         Consultants: Oncology [facility] team PT/OT   Procedures: None   Antibiotics: None   30 Day Unplanned Readmission Risk Score    Flowsheet Row ED to Hosp-Admission (Current) from 09/22/2022 in [facility]ICAL [facility]GICAL UNIT  30 Day Unplanned Readmission Risk Score (%) 74.49 Filed at 09/25/2022 0400       This score is the patient's risk of an unplanned readmission within 30 days of being discharged (0 -100%). The score is based on dignosis, age, lab data, medications, orders, and past utilization.   Low:  0-14.9   Medium: 15-21.9   High: 22-29.9   Extreme: 30 and above           Subjective: Very bright and cheery this AM, significantly improved sensorium.  No complaints other than wanting to walk.   Objective: Vitals:  09/24/22 2347 09/25/22 0517  BP: (!) 152/60 (!) 157/70  Pulse: 79 80  Resp:    Temp:  98.4 F (36.9 C)  SpO2: 96% 99%    Intake/Output Summary (Last 24 hours) at 09/25/2022 1412 Last data filed at 09/25/2022 1011 Gross per 24 hour   Intake 3212.46 ml  Output 1500 ml  Net 1712.46 ml   Filed Weights   09/22/22 0723 09/22/22 2245 09/23/22 1450  Weight: 82.6 kg 72.9 kg 72.2 kg    Exam:  General:  Appears calm and comfortable and is in NAD Eyes:  PERRL, EOMI, normal lids, iris ENT:  grossly normal hearing, lips & tongue, mmm Neck:  no LAD, masses or thyromegaly Cardiovascular:  RRR, no m/r/g. No LE edema.  Respiratory:   CTA bilaterally with no wheezes/rales/rhonchi.  Normal respiratory effort. Abdomen:  soft, NT, ND Skin:  no rash or induration seen on limited exam Musculoskeletal:  L foot drop, L pedal weakness, no bony abnormality Psychiatric:  pleasant and upbeat mood and affect, speech fluent and appropriate, AOx3 Neurologic:  CN 2-12 grossly intact, moves all extremities in coordinated fashion with diminished L pedal strength and L foot drop (chronic)  Data Reviewed: I have reviewed the patient's lab results since admission.  Pertinent labs for today include:  CO2 21 Unremarkable CBC     Family Communication: None present; palliative care meeting today with family  Disposition: Status is: Inpatient Remains inpatient appropriate because: unsafe disposition  Planned Discharge Destination: Skilled nursing facility    Time spent: 35 minutes  Author: Jonah Blue, MD 09/25/2022 2:12 PM  For on call review [email or web address].

## 2022-09-26 DIAGNOSIS — R531 Weakness: Secondary | ICD-10-CM | POA: Diagnosis not present

## 2022-09-26 DIAGNOSIS — E785 Hyperlipidemia, unspecified: Secondary | ICD-10-CM | POA: Diagnosis not present

## 2022-09-26 DIAGNOSIS — M899 Disorder of bone, unspecified: Secondary | ICD-10-CM | POA: Diagnosis not present

## 2022-09-26 DIAGNOSIS — G20A1 Parkinson's disease without dyskinesia, without mention of fluctuations: Secondary | ICD-10-CM | POA: Diagnosis not present

## 2022-09-26 LAB — CBC WITH DIFFERENTIAL/PLATELET
Abs Immature Granulocytes: 0.14 10*3/uL — ABNORMAL HIGH (ref 0.00–0.07)
Basophils Absolute: 0.1 10*3/uL (ref 0.0–0.1)
Basophils Relative: 1 %
Eosinophils Absolute: 0.2 10*3/uL (ref 0.0–0.5)
Eosinophils Relative: 2 %
HCT: 37.1 % (ref 36.0–46.0)
Hemoglobin: 11.8 g/dL — ABNORMAL LOW (ref 12.0–15.0)
Immature Granulocytes: 2 %
Lymphocytes Relative: 11 %
Lymphs Abs: 0.9 10*3/uL (ref 0.7–4.0)
MCH: 25.4 pg — ABNORMAL LOW (ref 26.0–34.0)
MCHC: 31.8 g/dL (ref 30.0–36.0)
MCV: 80 fL (ref 80.0–100.0)
Monocytes Absolute: 0.6 10*3/uL (ref 0.1–1.0)
Monocytes Relative: 8 %
Neutro Abs: 6 10*3/uL (ref 1.7–7.7)
Neutrophils Relative %: 76 %
Platelets: 264 10*3/uL (ref 150–400)
RBC: 4.64 MIL/uL (ref 3.87–5.11)
RDW: 14.3 % (ref 11.5–15.5)
WBC: 7.9 10*3/uL (ref 4.0–10.5)
nRBC: 0 % (ref 0.0–0.2)

## 2022-09-26 LAB — BASIC METABOLIC PANEL
Anion gap: 9 (ref 5–15)
BUN: 11 mg/dL (ref 8–23)
CO2: 22 mmol/L (ref 22–32)
Calcium: 8.5 mg/dL — ABNORMAL LOW (ref 8.9–10.3)
Chloride: 103 mmol/L (ref 98–111)
Creatinine, Ser: 0.63 mg/dL (ref 0.44–1.00)
GFR, Estimated: >60 mL/min (ref >60)
Glucose, Bld: 151 mg/dL — ABNORMAL HIGH (ref 70–99)
Potassium: 3.4 mmol/L — ABNORMAL LOW (ref 3.5–5.1)
Sodium: 134 mmol/L — ABNORMAL LOW (ref 135–145)

## 2022-09-26 LAB — CALCIUM, IONIZED: Calcium, Ionized, Serum: 3.6 mg/dL — ABNORMAL LOW (ref 4.5–5.6)

## 2022-09-26 MED ORDER — POTASSIUM CHLORIDE CRYS ER 20 MEQ PO TBCR
40.0000 meq | EXTENDED_RELEASE_TABLET | ORAL | Status: AC
  Administered 2022-09-26: 40 meq via ORAL
  Filled 2022-09-26: qty 2

## 2022-09-26 NOTE — TOC Progression Note (Signed)
Transition of Care [facility]) - Progression Note    Patient Details  Name: Vanessa Stephens MRN: [id number] Date of Birth: 1950-11-18  Transition of Care [facility] CM/SW Contact  Villa Herb, [place] Phone Number: 09/26/2022, 11:34 AM  Clinical Narrative:    CSW updated that pts insurance Berkley Harvey has been approved for SNF. MD completed D/C as pt is medically stable. CSW spoke to Destiny with [facility] SNF who states they do not have a bed to take pt today but will have one tomorrow. CSW updated RN and MD of this. Avoidable day added. TOC to follow.   Expected Discharge Plan: Skilled Nursing Facility Barriers to Discharge: Continued Medical Work up  Expected Discharge Plan and Services         Expected Discharge Date: 09/26/22                                     Social Determinants of Health (SDOH) Interventions SDOH Screenings   Food Insecurity: No Food Insecurity (09/22/2022)  Housing: Patient Declined (09/22/2022)  Transportation Needs: No Transportation Needs (09/22/2022)  Utilities: Not At Risk (09/22/2022)  Financial Resource Strain: Low Risk  (10/02/2021)   [facility] from [facility]  Tobacco Use: Medium Risk (09/25/2022)    Readmission Risk Interventions    09/24/2022    3:37 PM  Readmission Risk Prevention Plan  Transportation Screening Complete  PCP or Specialist Appt within 5-7 Days Not Complete  Home Care Screening Complete  Medication Review (RN CM) Complete

## 2022-09-26 NOTE — Progress Notes (Signed)
Physician Discharge Summary   Patient: Vanessa Stephens MRN: [id number] DOB: 12-26-1950  Admit date:     09/22/2022  Discharge date: 09/26/22  Discharge Physician: Vonzella Nipple   PCP: Ignatius Specking, MD   Recommendations at discharge:  Patient is discharging to the facility with palliative care following her there.  Patient blister discharge today. Per Case management no beds available today therefore she will discharge tomorrow.   Discharge Diagnoses: Principal Problem:   Generalized weakness Active Problems:   Hyperlipidemia   Hypokalemia   Parkinson's disease   Dementia (HCC)   Lytic lesion of bone on x-ray   DNR (do not resuscitate)   Goals of care, counseling/discussion  Resolved Problems:   Heart murmur  Hospital Course: Patient is a 72 yo with h/o DM, Parkinson's with dementia, and HTN who presented on 8/18 with generalized weakness, dehydration. Head CT with lytic lesions in calvarium, MRI L-spine with diffuse bony metastatic disease. She has a prior h/o uterine and breast cancer. Palliative care has met with patient/family and they are in agreement that she would not want to proceed with further evaluation and treatment of malignancy. She has declined oncology consultation and will proceed with SNF rehab with the hope of returning home. She is hospice appropriate but does not appear to need residential hospice at this time.   Assessment and Plan: * Generalized weakness Patient is normally able to ambulate with a walker On admission, she could not even stand up COVID negative CT head shows metastatic disease with lytic lesions in the calvarium MRI lumbar spine also shows diffuse bony metastatic disease to the lower thoracic, lumbar, and sacrum PT/OT consulted and recommend SNF rehab She has been accepted to [facility] rehab and is awaiting insurance approval and then will dc  Goals of care, counseling/discussion She is DNR, ACP documents present Based on advancing  dementia, GOC conversation was initiated with her granddaughter Granddaughter thinks that the patient would not want further evaluation and treatment and would not want to know about underlying malignancy Held pulm/IR/oncology consults  GOC conversation held with palliative care team Patient/family decline further evaluation and treatment Outpatient palliative care vs. Transition to hospice (outpatient)  DNR (do not resuscitate) DNR order in ACP documents, reviewed on admission  Lytic lesion of bone on x-ray Imaging with lytic lesions of calvarium, lower thoracic, lumbar, sacrum Possible new diagnosis of multiple myeloma but metastatic disease appears more likely  Discussed with oncology and she would need a tissue biopsy as the next step - either bronch with tissue diagnosis or bone biopsy with IR Either of these options would require evaluation in [place], inpatient vs. Outpatient Patient was d/w Drs. Smith and Icard from pulm, who are willing to do virtual evaluation and/or inpatient/outpatient evaluation as needed Discussed GOC with family  Family does not want to pursue further testing at this time, declined oncology evaluation    Dementia (HCC) Likely related to Parkinson's Continue Namenda, Symmetrel Continue to reorient as needed Delirium precautions  Parkinson's disease Continue Sinemet Continue Symmetrel  Hypokalemia Potassium replaced.  Monitor at the facility and replace as needed.  Hyperlipidemia She does not appear to be taking medications for this issue at this time    Discharge plan of care discussed with the patient/family at the bedside.  Answered their questions appropriately to the best of my knowledge.  They verbalized understanding and she is being discharged to the facility in a stable condition.  However, due to her complex medical problems she is  high risk for rehospitalization.   Nutrition Documentation    Flowsheet Row ED to Hosp-Admission  (Current) from 09/22/2022 in [facility] SURGICAL UNIT  Nutrition Problem Predicted suboptimal nutrient intake  Etiology chronic illness  [dementia]  Nutrition Goal Patient will meet greater than or equal to 90% of their needs  Interventions Ensure Enlive (each supplement provides 350kcal and 20 grams of protein), Magic cup, MVI      Pain control - [place] Controlled Substance Reporting System database was reviewed. and patient was instructed, not to drive, operate heavy machinery, perform activities at heights, swimming or participation in water activities or provide baby-sitting services while on Pain, Sleep and Anxiety Medications; until their outpatient Physician has advised to do so again. Also recommended to not to take more than prescribed Pain, Sleep and Anxiety Medications.  Consultants: Palliative care Disposition: Skilled nursing facility Diet recommendation:  Regular diet DISCHARGE MEDICATION: Allergies as of 09/26/2022       Reactions   Codeine Nausea And Vomiting   Patient stated she was not allergic to codeine anymore         Medication List     STOP taking these medications    cefUROXime 500 MG tablet Commonly known as: CEFTIN       TAKE these medications    amantadine 100 MG capsule Commonly known as: SYMMETREL Take 1 capsule (100 mg total) by mouth 2 (two) times daily.   carbidopa-levodopa 25-100 MG tablet Commonly known as: SINEMET IR Take 1 tablet by mouth 4 (four) times daily.   cyanocobalamin 1000 MCG tablet Commonly known as: VITAMIN B12 Take 1,000 mcg by mouth daily.   DULoxetine 30 MG capsule Commonly known as: CYMBALTA Take 60 mg by mouth daily.   memantine 10 MG tablet Commonly known as: NAMENDA Take 1 tablet (10 mg total) by mouth 2 (two) times daily.   metoprolol tartrate 50 MG tablet Commonly known as: LOPRESSOR Take 1 tablet by mouth daily.   multivitamin tablet Take 1 tablet by mouth daily.        Contact  information for after-discharge care     Destination     [facility] Preferred SNF .   Service: Skilled Nursing Contact information: [address] [place] [address] [phone]                    Discharge Exam: Ceasar Mons Weights   09/22/22 0723 09/22/22 2245 09/23/22 1450  Weight: 82.6 kg 72.9 kg 72.2 kg   General: Elderly female, appears calm and comfortable and is in NAD Eyes:  PERRL, EOMI, normal lids, iris ENT:  grossly normal hearing, lips & tongue, mmm Neck:  no LAD, masses or thyromegaly Cardiovascular:  RRR, no m/r/g. No LE edema.  Respiratory:   CTA bilaterally with no wheezes/rales/rhonchi.  Normal respiratory effort. Abdomen:  soft, NT, ND Skin:  no rash or induration seen on limited exam Musculoskeletal:  L foot drop, L pedal weakness, no bony abnormality Psychiatric:  pleasant and upbeat mood and affect, speech fluent and appropriate, AOx3 Neurologic:  diminished L pedal strength and L foot drop (chronic)   Condition at discharge: fair  The results of significant diagnostics from this hospitalization (including imaging, microbiology, ancillary and laboratory) are listed below for reference.   Imaging Studies: MR Lumbar Spine W Wo Contrast  Result Date: 09/22/2022 CLINICAL DATA:  Metastatic disease evaluation diffuse mets on CT, unable to walk x2 days. EXAM: MRI LUMBAR SPINE WITHOUT AND  WITH CONTRAST TECHNIQUE: Multiplanar and multiecho pulse sequences of the lumbar spine were obtained without and with intravenous contrast. CONTRAST:  8mL GADAVIST GADOBUTROL 1 MMOL/ML IV SOLN COMPARISON:  MRI lumbar spine 09/25/2020. FINDINGS: Segmentation: Conventional numbering is assumed with 5 non-rib-bearing, lumbar type vertebral bodies. Alignment:  Normal. Vertebrae: Diffuse bony metastatic disease to the lower thoracic, lumbar spine and sacrum. No evidence of epidural or paraspinal tumor. Prior L4-S1 interbody and posterior spinal  fusion. Conus medullaris and cauda equina: Conus extends to the T12-L1 level. Conus and cauda equina appear normal. No abnormal enhancement. Paraspinal and other soft tissues: No acute findings. Disc levels: T12-L1: Small disc bulge and mild bilateral facet arthropathy. No spinal canal stenosis or neural foraminal narrowing. L1-L2: Disc bulge and facet arthropathy results in moderate spinal canal stenosis and moderate right neural foraminal narrowing, unchanged. L2-L3: Disc bulge and facet arthropathy results in severe spinal canal stenosis, severe left and moderate right neural foraminal narrowing, unchanged. L3-L4: Disc bulge and facet arthropathy results in severe spinal canal stenosis and moderate left neural foraminal narrowing, unchanged. L4-L5: Prior interbody and posterior spinal fusion. No spinal canal stenosis or neural foraminal narrowing. L5-S1: Prior interbody and posterior spinal fusion. No spinal canal stenosis or neural foraminal narrowing. IMPRESSION: 1. Diffuse bony metastatic disease to the lower thoracic, lumbar spine and sacrum. No evidence of epidural or paraspinal tumor. 2. Unchanged multilevel lumbar spondylosis, worst at L2-3 and L3-4, where there is severe spinal canal stenosis at L2-L3 and L3-L4. 3. Unchanged moderate spinal canal stenosis at L1-L2. Electronically Signed   By: Orvan Falconer M.D.   On: 09/22/2022 20:04   CT CHEST ABDOMEN PELVIS W CONTRAST  Result Date: 09/22/2022 CLINICAL DATA:  Osseous metastases suspected. Unknown primary tumor. EXAM: CT CHEST, ABDOMEN, AND PELVIS WITH CONTRAST TECHNIQUE: Multidetector CT imaging of the chest, abdomen and pelvis was performed following the standard protocol during bolus administration of intravenous contrast. RADIATION DOSE REDUCTION: This exam was performed according to the departmental dose-optimization program which includes automated exposure control, adjustment of the mA and/or kV according to patient size and/or use of  iterative reconstruction technique. CONTRAST:  OMNIPAQUE IOHEXOL 300 MG/ML  SOLN COMPARISON:  Pelvic CT 08/25/2020 FINDINGS: CT CHEST FINDINGS Cardiovascular: Heart is normal in size. Thoracic aorta is normal caliber. Calcified plaque throughout the thoracic aorta. Pulmonary arterial system is well opacified. Remaining vascular structures are unremarkable. Mediastinum/Nodes: Moderate mediastinal and bilateral hilar adenopathy is present. 2.6 cm subcarinal lymph node, 1.7 cm AP window lymph node, 1.7 cm right hilar lymph node and 1 cm left hilar lymph node. Remaining mediastinal structures are unremarkable. No axillary adenopathy. Lungs/Pleura: Lungs are adequately inflated without focal airspace consolidation or effusion. There are a few peripheral and subpleural nodular densities bilaterally as some of these are along the fissures. Largest nodule within the right lung is over the lower lobe measuring 7 mm (image 103). Largest nodule within the right lung over the lingula measuring 5-6 mm (image 100). No effusion. Airways are normal. Musculoskeletal: Patchy lytic lesions over the thoracic spine and a few posterior ribs suggesting metastatic disease. CT ABDOMEN PELVIS FINDINGS Hepatobiliary: Nodular contour of the liver suggesting cirrhosis. Previous cholecystectomy. Biliary tree is normal. Pancreas: Normal. Spleen: Several hypodense masses within the spleen with the largest over the left anterolateral aspect measuring 2.6 cm suggesting metastatic disease. Adrenals/Urinary Tract: Adrenal glands are normal and symmetric. Kidneys are normal in size without hydronephrosis. No focal mass. Ureters and bladder are normal. Stomach/Bowel: Stomach is under distended.  Small bowel is within normal. Appendix is normal. Diverticulosis of the colon. Vascular/Lymphatic: Minimal calcified plaque over the abdominal aorta which is normal in caliber. Remaining vascular structures are unremarkable. No adenopathy in the  abdomen/pelvis. Reproductive: Uterus and bilateral adnexa are unremarkable. Other: No free fluid or focal inflammatory change. Nonspecific rim calcified structure over the anterior left lower quadrant measuring 2.4 cm which is mobile as this was present on the previous pelvic CT over the left lower quadrant. Musculoskeletal: Posterior fusion hardware over the lumbosacral spine. Patchy lytic lesions throughout the thoracolumbar spine with several small lytic lesions over the pelvic bones suggesting metastatic disease. IMPRESSION: 1. Moderate mediastinal and bilateral hilar adenopathy as described. 2. Several peripheral and subpleural nodular densities bilaterally some along the fissures. Largest nodule within the right lung over the lower lobe measuring 7 mm. Largest nodule within the right lung over the lingula measuring 5-6 mm. Findings are nonspecific as metastatic disease is possible as recommend attention on follow-up. 3. Several hypodense masses within the spleen with the largest over the left anterolateral aspect measuring 2.6 cm suggesting metastatic disease. 4. Patchy lytic lesions throughout the thoracolumbar spine with several small lytic lesions over the pelvic bones suggesting metastatic disease. 5. Morphologic liver changes suggesting cirrhosis. 6. Diverticulosis of the colon. 7. Aortic atherosclerosis. Aortic Atherosclerosis (ICD10-I70.0). Electronically Signed   By: Elberta Fortis M.D.   On: 09/22/2022 11:21   CT Head Wo Contrast  Result Date: 09/22/2022 CLINICAL DATA:  Delirium. EXAM: CT HEAD WITHOUT CONTRAST TECHNIQUE: Contiguous axial images were obtained from the base of the skull through the vertex without intravenous contrast. RADIATION DOSE REDUCTION: This exam was performed according to the departmental dose-optimization program which includes automated exposure control, adjustment of the mA and/or kV according to patient size and/or use of iterative reconstruction technique. COMPARISON:   05/30/2021 brain MRI FINDINGS: Brain: No evidence of acute infarction, hemorrhage, hydrocephalus, extra-axial collection or mass lesion/mass effect. Chronic small vessel ischemia in the cerebral white matter. Mild cerebral volume loss Vascular: No hyperdense vessel or unexpected calcification. Skull: Multiple lytic lesions in the calvarium not seen on prior brain MRI, 1 of the largest along the right aspect of the coronal suture measuring 16 mm on 2:25. additional notable lesion at the upper right greater sphenoid wing measuring 2.1 cm, with inner table erosion. Sinuses/Orbits: Negative IMPRESSION: 1. Multiple lytic lesions in the calvarium, not present on brain MRI from last year, concerning for metastatic disease or myeloma. 2. No acute intracranial finding. Electronically Signed   By: Tiburcio Pea M.D.   On: 09/22/2022 08:50   DG Chest Portable 1 View  Result Date: 09/22/2022 CLINICAL DATA:  Altered mental status with weakness and foul-smelling urine. EXAM: PORTABLE CHEST 1 VIEW COMPARISON:  07/16/2021, mammogram 01/26/2015 FINDINGS: Lungs are adequately inflated without focal airspace consolidation or effusion. Cardiomediastinal silhouette is normal. Extensive benign dystrophic calcification over the left breast soft tissues. Remainder the exam is unchanged. IMPRESSION: 1. No acute cardiopulmonary disease. 2. Extensive calcification over the left breast soft tissues. Electronically Signed   By: Elberta Fortis M.D.   On: 09/22/2022 07:53    Microbiology: Results for orders placed or performed during the hospital encounter of 09/22/22  SARS Coronavirus 2 by RT PCR (hospital order, performed in [facility] hospital lab) *cepheid single result test* Anterior Nasal Swab     Status: None   Collection Time: 09/22/22  8:16 AM   Specimen: Anterior Nasal Swab  Result Value Ref Range Status   SARS  Coronavirus 2 by RT PCR NEGATIVE NEGATIVE Final    Comment: (NOTE) SARS-CoV-2 target nucleic acids are NOT  DETECTED.  The SARS-CoV-2 RNA is generally detectable in upper and lower respiratory specimens during the acute phase of infection. The lowest concentration of SARS-CoV-2 viral copies this assay can detect is 250 copies / mL. A negative result does not preclude SARS-CoV-2 infection and should not be used as the sole basis for treatment or other patient management decisions.  A negative result may occur with improper specimen collection / handling, submission of specimen other than nasopharyngeal swab, presence of viral mutation(s) within the areas targeted by this assay, and inadequate number of viral copies (<250 copies / mL). A negative result must be combined with clinical observations, patient history, and epidemiological information.  Fact Sheet for Patients:   RoadLapTop.co.za  Fact Sheet for Healthcare Providers: http://kim-miller.com/  This test is not yet approved or  cleared by the [place] FDA and has been authorized for detection and/or diagnosis of SARS-CoV-2 by FDA under an Emergency Use Authorization (EUA).  This EUA will remain in effect (meaning this test can be used) for the duration of the COVID-19 declaration under Section 564(b)(1) of the Act, 21 U.S.C. section 360bbb-3(b)(1), unless the authorization is terminated or revoked sooner.  Performed at [facility], [address] [place], [place] [address]   MRSA Next Gen by PCR, Nasal     Status: None   Collection Time: 09/22/22 10:15 PM   Specimen: Nasal Mucosa; Nasal Swab  Result Value Ref Range Status   MRSA by PCR Next Gen NOT DETECTED NOT DETECTED Final    Comment: (NOTE) The GeneXpert MRSA Assay (FDA approved for NASAL specimens only), is one component of a comprehensive MRSA colonization surveillance program. It is not intended to diagnose MRSA infection nor to guide or monitor treatment for MRSA infections. Test performance is not FDA approved in  patients less than 70 years old. Performed at [facility], [address], [place], [place] [address]   MRSA Next Gen by PCR, Nasal     Status: None   Collection Time: 09/22/22 10:50 PM   Specimen: Nasal Mucosa; Nasal Swab  Result Value Ref Range Status   MRSA by PCR Next Gen NOT DETECTED NOT DETECTED Final    Comment: (NOTE) The GeneXpert MRSA Assay (FDA approved for NASAL specimens only), is one component of a comprehensive MRSA colonization surveillance program. It is not intended to diagnose MRSA infection nor to guide or monitor treatment for MRSA infections. Test performance is not FDA approved in patients less than 74 years old. Performed at [facility], [address] [place], [place] [address]     Labs: CBC: Recent Labs  Lab 09/22/22 0803 09/23/22 0427 09/24/22 0421 09/25/22 0424 09/26/22 0421  WBC 8.0 8.2 5.3 6.4 7.9  NEUTROABS 6.4 6.2 3.9 4.3 6.0  HGB 12.8 12.0 9.5* 10.8* 11.8*  HCT 40.3 37.9 31.6* 36.3 37.1  MCV 80.9 80.3 82.9 83.4 80.0  PLT 265 259 178 212 264   Basic Metabolic Panel: Recent Labs  Lab 09/22/22 0803 09/23/22 0427 09/24/22 0421 09/25/22 0424 09/26/22 0421  NA 135 136 140 137 134*  K 3.0* 3.1* 2.7* 3.9 3.4*  CL 97* 102 114* 107 103  CO2 26 23 19* 21* 22  GLUCOSE 119* 97 83 85 151*  BUN 23 15 15 13 11   CREATININE 1.00 0.65 0.50 0.64 0.63  CALCIUM 9.0 8.5* 7.0* 8.5* 8.5*  MG  --  1.7  --   --   --  Liver Function Tests: Recent Labs  Lab 09/22/22 0803 09/23/22 0427  AST 31 32  ALT 10 8  ALKPHOS 329* 371*  BILITOT 0.9 0.8  PROT 7.1 6.6  ALBUMIN 3.3* 2.9*   CBG: No results for input(s): "GLUCAP" in the last 168 hours.  Time spent 50 min  Signed: Vonzella Nipple, MD [facility] 09/26/2022

## 2022-09-26 NOTE — Progress Notes (Signed)
Palliative: Chart review completed.  Mrs. Loayza is to discharge to short-term rehab to [facility] for physical therapy related to generalized weakness.  CT head showed metastatic disease with lytic lesions at calvarium and diffuse bony mets metastatic disease lower thoracic, lumbar and sacrum.  Patient has "battled" cancer in the past and does not want any further workup or treatment.  Conference with attending, bedside nursing staff, transition of care team related to patient condition, needs, goals of care, disposition.  Plan: At this point continue to treat the treatable but no CPR or intubation.  Short-term rehab at [facility], anticipate discharge 8/23.  Ultimate goal of returning home.  Agreeable to outpatient palliative services with [facility].  DNR/goldenrod form completed and placed on chart.  No charge Lillia Carmel, NP Palliative medicine team Team phone [phone] Greater than 50% of this time was spent counseling and coordinating care related to the above assessment and plan.

## 2022-09-26 NOTE — Evaluation (Signed)
Occupational Therapy Evaluation Patient Details Name: Vanessa Stephens MRN: [id number] DOB: [birth year] Today's Date: 09/26/2022   History of Present Illness Vanessa Stephens is a 72 y.o. female with medical history significant of cancer, diabetes mellitus, Parkinson's dementia, hypertension, and more presents the ED with a chief complaint of generalized weakness.  Patient reports that she fell down a couple of days ago and hurt her right leg.  She reports it has been healing well.  And she thinks that is why she is in the hospital.  When reminded that she was weak today, she reports that she remembers not being able to stand up.  She reports she has not been eating or drinking at home.  She says it is because she has not had any appetite.  She also reports that her caretaker had left for a week.  She said just friends and people were helping her during the week that her caretaker was gone, but she is very vague about it.  Patient denies any chest pain, nausea, vomiting, diarrhea, dysuria, fever, dyspnea.  She is normally able to ambulate with a walker and today could not even stand up.  Patient has no other complaints at this time. (per MD)   Clinical Impression   Pt agreeable to OT evaluation. Pt presents very weak with much assist for bed mobility to sit at EOB. Mod to max A needed with labored effort. Mod A for sit to stand from EOB with RW, but pt was unable to step laterally towards chair. RW removed with pt needing more mod to max A to pivot to the chair. Pt unable to adjust B socks seated in recliner. Pt will benefit from continued OT in the hospital and recommended venue below to increase strength, balance, and endurance for safe ADL's.          If plan is discharge home, recommend the following: A lot of help with walking and/or transfers;A lot of help with bathing/dressing/bathroom;Assistance with cooking/housework;Assist for transportation;Help with stairs or ramp for entrance     Functional Status Assessment  Patient has had a recent decline in their functional status and demonstrates the ability to make significant improvements in function in a reasonable and predictable amount of time.  Equipment Recommendations  None recommended by OT    Recommendations for Other Services       Precautions / Restrictions Precautions Precautions: Fall Restrictions Weight Bearing Restrictions: No      Mobility Bed Mobility Overal bed mobility: Needs Assistance Bed Mobility: Supine to Sit     Supine to sit: Mod assist, Max assist     General bed mobility comments: labored movement; weak with assist needed to pull to sit.    Transfers Overall transfer level: Needs assistance Equipment used: Rolling walker (2 wheels) Transfers: Sit to/from Stand, Bed to chair/wheelchair/BSC Sit to Stand: Mod assist Stand pivot transfers: Max assist, Mod assist         General transfer comment: Able to stand from EOB ~3 reps with mod A. L lateral lean noted. RW removed in order to assist pt to pivot to chair due to miniaml functional steps during attempts to step to the chair.      Balance Overall balance assessment: Needs assistance Sitting-balance support: Bilateral upper extremity supported, Feet supported Sitting balance-Leahy Scale: Poor Sitting balance - Comments: poor to fair seated at EOB Postural control: Left lateral lean Standing balance support: Bilateral upper extremity supported, During functional activity, Reliant on assistive device for balance Standing  balance-Leahy Scale: Poor Standing balance comment: L lateral lean with RW                           ADL either performed or assessed with clinical judgement   ADL Overall ADL's : Needs assistance/impaired         Upper Body Bathing: Sitting;Contact guard assist;Minimal assistance   Lower Body Bathing: Maximal assistance;Sitting/lateral leans   Upper Body Dressing : Minimal  assistance;Sitting   Lower Body Dressing: Maximal assistance;Sitting/lateral leans   Toilet Transfer: Moderate assistance;Maximal assistance;Stand-pivot;Rolling walker (2 wheels) Toilet Transfer Details (indicate cue type and reason): Simulated via EOB to chair transfer. Toileting- Clothing Manipulation and Hygiene: Maximal assistance;Bed level;Total assistance               Vision Baseline Vision/History: 1 Wears glasses Ability to See in Adequate Light: 1 Impaired Patient Visual Report: No change from baseline Vision Assessment?: No apparent visual deficits     Perception Perception: Not tested       Praxis Praxis: Not tested       Pertinent Vitals/Pain Pain Assessment Pain Assessment: Faces Faces Pain Scale: Hurts a little bit Pain Location: headache behind ear Pain Descriptors / Indicators: Headache Pain Intervention(s): Limited activity within patient's tolerance, Monitored during session, Repositioned     Extremity/Trunk Assessment Upper Extremity Assessment Upper Extremity Assessment: Generalized weakness (3-/5 B UE shoulder flexion. WFL PROM for shoulder flexion.)   Lower Extremity Assessment Lower Extremity Assessment: Defer to PT evaluation   Cervical / Trunk Assessment Cervical / Trunk Assessment: Kyphotic   Communication Communication Communication: No apparent difficulties   Cognition Arousal: Alert Behavior During Therapy: WFL for tasks assessed/performed Overall Cognitive Status: History of cognitive impairments - at baseline                                                        Home Living Family/patient expects to be discharged to:: Private residence Living Arrangements: Children Available Help at Discharge: Family;Available 24 hours/day Type of Home: House Home Access: Stairs to enter [employer] of Steps: 10 Entrance Stairs-Rails: Right;Left Home Layout: One level     Bathroom Shower/Tub: Tub  only;Walk-in shower   Bathroom Toilet: Handicapped height Bathroom Accessibility: Yes   Home Equipment: Agricultural consultant (2 wheels);Rollator (4 wheels)   Additional Comments: per PT note      Prior Functioning/Environment Prior Level of Function : Needs assist       Physical Assist : ADLs (physical)   ADLs (physical): IADLs Mobility Comments: Pt reports mostly independent ambulation with rollator prior to admission. ADLs Comments: Assisted for IADL's. Pt reports independence for ADL's ; pt reports sponge bathing once a week.        OT Problem List: Decreased strength;Decreased range of motion;Decreased activity tolerance;Impaired balance (sitting and/or standing)      OT Treatment/Interventions: Self-care/ADL training;Therapeutic exercise;Therapeutic activities;Patient/family education;Balance training;DME and/or AE instruction    OT Goals(Current goals can be found in the care plan section) Acute Rehab OT Goals Patient Stated Goal: improve function OT Goal Formulation: With patient Time For Goal Achievement: 10/10/22 Potential to Achieve Goals: Good  OT Frequency: Min 2X/week  End of Session    Activity Tolerance:   Patient left:    OT Visit Diagnosis: Unsteadiness on feet (R26.81);Other abnormalities of gait and mobility (R26.89);Muscle weakness (generalized) (M62.81)                Time: 1610-9604 OT Time Calculation (min): 18 min Charges:  OT General Charges $OT Visit: 1 Visit OT Evaluation $OT Eval Low Complexity: 1 Low  [place] OT, MOT  Danie Chandler 09/26/2022, 11:36 AM

## 2022-09-26 NOTE — Plan of Care (Signed)
Pt alert and oriented x 2-3. Pt forgetful of time and situation at times. Pt med compliant but needs encouragement and reinforcement to take. Forgetful if times for meds. Vitals stable.  Problem: Education: Goal: Knowledge of General Education information will improve Description: Including pain rating scale, medication(s)/side effects and non-pharmacologic comfort measures Outcome: Progressing   Problem: Health Behavior/Discharge Planning: Goal: Ability to manage health-related needs will improve Outcome: Progressing   Problem: Clinical Measurements: Goal: Ability to maintain clinical measurements within normal limits will improve Outcome: Progressing Goal: Will remain free from infection Outcome: Progressing Goal: Diagnostic test results will improve Outcome: Progressing Goal: Respiratory complications will improve Outcome: Progressing Goal: Cardiovascular complication will be avoided Outcome: Progressing   Problem: Activity: Goal: Risk for activity intolerance will decrease Outcome: Progressing   Problem: Nutrition: Goal: Adequate nutrition will be maintained Outcome: Progressing   Problem: Coping: Goal: Level of anxiety will decrease Outcome: Progressing   Problem: Elimination: Goal: Will not experience complications related to bowel motility Outcome: Progressing Goal: Will not experience complications related to urinary retention Outcome: Progressing   Problem: Pain Managment: Goal: General experience of comfort will improve Outcome: Progressing   Problem: Safety: Goal: Ability to remain free from injury will improve Outcome: Progressing   Problem: Skin Integrity: Goal: Risk for impaired skin integrity will decrease Outcome: Progressing

## 2022-09-26 NOTE — Care Management Important Message (Signed)
Important Message  Patient Details  Name: Vanessa Stephens MRN: [id number] Date of Birth: 11-21-1950   Medicare Important Message Given:  N/A - LOS <3 / Initial given by admissions     Corey Harold 09/26/2022, 3:10 PM

## 2022-09-26 NOTE — Progress Notes (Signed)
Physical Therapy Treatment Patient Details Name: Vanessa Stephens MRN: [id number] DOB: [birth year] Today's Date: 09/26/2022   History of Present Illness per MD:" Vanessa Stephens is a 72 y.o. female with medical history significant of cancer, diabetes mellitus, Parkinson's dementia, hypertension, and more presents the ED with a chief complaint of generalized weakness.  Patient reports that she fell down a couple of days ago and hurt her right leg.  She reports it has been healing well.  And she thinks that is why she is in the hospital.  When reminded that she was weak today, she reports that she remembers not being able to stand up.  She reports she has not been eating or drinking at home.  She says it is because she has not had any appetite.  She also reports that her caretaker had left for a week.  She said just friends and people were helping her during the week that her caretaker was gone, but she is very vague about it.  Patient denies any chest pain, nausea, vomiting, diarrhea, dysuria, fever, dyspnea.  She is normally able to ambulate with a walker and today could not even stand up.  Patient has no other complaints at this time."    PT Comments  Pt lying in bed agreeable to therapy participation.  Pt required increased time to complete all mobility due to "inching" movements of bil LE, mainly Lt which she was unable to move at all at times requiring more assist from therapist.  Pt with difficulty weight shifting to Rt, extreme lean to Lt, fwd flexed at side of bed increasing fall risk.  Even with cues, difficulty to correct and unable to sit erect in alignment.  Pt able to stand with min-mod assist and cues to push self rather than pull.  Once standing, pt was "stuck" to floor, unable to advance Lt without total assist from therapist and only slight movement achieved with Rt X 2 different attempts with RW.  Pt was then transferred edge of bed to chair with total assist.  Pt up in chair with visitor  present.     If plan is discharge home, recommend the following: A lot of help with walking and/or transfers;A little help with bathing/dressing/bathroom;Assistance with cooking/housework;Direct supervision/assist for financial management;Assist for transportation   Can travel by private vehicle     No  Equipment Recommendations  None recommended by PT    Recommendations for Other Services       Precautions / Restrictions Precautions Precautions: Fall Restrictions Weight Bearing Restrictions: No     Mobility  Bed Mobility Overal bed mobility: Needs Assistance Bed Mobility: Supine to Sit     Supine to sit: Mod assist, Max assist     General bed mobility comments: labored movement, especially with Lt LE.  Tends to "pull" rather than push.  Difficulty maintaining seated balance.  Weak, leaning to Lt upon sitting    Transfers Overall transfer level: Needs assistance Equipment used: Rolling walker (2 wheels) Transfers: Sit to/from Stand, Bed to chair/wheelchair/BSC Sit to Stand: Mod assist Stand pivot transfers: Max assist, Mod assist         General transfer comment: Able to stand from EOB with min-mod assist, however unable to begin and continue movement. Attempted X2 with RW then therapist max transfered to chair.n noted.    Ambulation/Gait Ambulation/Gait assistance: Total assist (unable to advance LE's without max assist from therapist)  Cognition Arousal: Alert Behavior During Therapy: WFL for tasks assessed/performed Overall Cognitive Status: History of cognitive impairments - at baseline                                                 Pertinent Vitals/Pain Pain Assessment Pain Assessment: No/denies pain              Frequency    Min 2X/week      PT Plan  Progress towards goals       AM-PAC PT "6 Clicks" Mobility   Outcome Measure  Help needed turning from your back to your side  while in a flat bed without using bedrails?: A Little Help needed moving from lying on your back to sitting on the side of a flat bed without using bedrails?: A Little Help needed moving to and from a bed to a chair (including a wheelchair)?: A Lot Help needed standing up from a chair using your arms (e.g., wheelchair or bedside chair)?: A Lot Help needed to walk in hospital room?: A Lot Help needed climbing 3-5 steps with a railing? : A Lot 6 Click Score: 14    End of Session Equipment Utilized During Treatment: Gait belt Activity Tolerance: Patient limited by fatigue;Other (comment) Patient left: in chair;with family/visitor present Nurse Communication: Mobility status PT Visit Diagnosis: Unsteadiness on feet (R26.81);Muscle weakness (generalized) (M62.81)     Time: 1610-9604 PT Time Calculation (min) (ACUTE ONLY): 30 min  Charges:    $Therapeutic Activity: 23-37 mins PT General Charges $$ ACUTE PT VISIT: 1 Visit                     Lurena Nida, PTA/CLT [facility] Ph: [phone]    Lurena Nida 09/26/2022, 4:30 PM

## 2022-09-26 NOTE — Discharge Summary (Addendum)
Physician Discharge Summary   Patient: Vanessa Stephens MRN: [id number] DOB: [birth year]  Admit date:     09/22/2022  Discharge date: 09/26/22  Discharge Physician: Vonzella Nipple   PCP: Ignatius Specking, MD   Recommendations at discharge:  Patient is discharging to the facility with palliative care following her there.   Discharge Diagnoses: Principal Problem:   Generalized weakness Active Problems:   Hyperlipidemia   Hypokalemia   Parkinson's disease   Dementia (HCC)   Lytic lesion of bone on x-ray   DNR (do not resuscitate)   Goals of care, counseling/discussion  Resolved Problems:   Heart murmur  Hospital Course: Patient is a 72 yo with h/o DM, Parkinson's with dementia, and HTN who presented on 8/18 with generalized weakness, dehydration. Head CT with lytic lesions in calvarium, MRI L-spine with diffuse bony metastatic disease. She has a prior h/o uterine and breast cancer. Palliative care has met with patient/family and they are in agreement that she would not want to proceed with further evaluation and treatment of malignancy. She has declined oncology consultation and will proceed with SNF rehab with the hope of returning home. She is hospice appropriate but does not appear to need residential hospice at this time.   Assessment and Plan: * Generalized weakness Patient is normally able to ambulate with a walker On admission, she could not even stand up COVID negative CT head shows metastatic disease with lytic lesions in the calvarium MRI lumbar spine also shows diffuse bony metastatic disease to the lower thoracic, lumbar, and sacrum PT/OT consulted and recommend SNF rehab She has been accepted to [facility] rehab and is awaiting insurance approval and then will dc  Goals of care, counseling/discussion She is DNR, ACP documents present Based on advancing dementia, GOC conversation was initiated with her granddaughter Granddaughter thinks that the patient would not want  further evaluation and treatment and would not want to know about underlying malignancy Held pulm/IR/oncology consults  GOC conversation held with palliative care team Patient/family decline further evaluation and treatment Outpatient palliative care vs. Transition to hospice (outpatient)  DNR (do not resuscitate) DNR order in ACP documents, reviewed on admission  Lytic lesion of bone on x-ray Imaging with lytic lesions of calvarium, lower thoracic, lumbar, sacrum Possible new diagnosis of multiple myeloma but metastatic disease appears more likely  Discussed with oncology and she would need a tissue biopsy as the next step - either bronch with tissue diagnosis or bone biopsy with IR Either of these options would require evaluation in [place], inpatient vs. Outpatient Patient was d/w Drs. Smith and Icard from pulm, who are willing to do virtual evaluation and/or inpatient/outpatient evaluation as needed Discussed GOC with family  Family does not want to pursue further testing at this time, declined oncology evaluation    Dementia (HCC) Likely related to Parkinson's Continue Namenda, Symmetrel Continue to reorient as needed Delirium precautions  Parkinson's disease Continue Sinemet Continue Symmetrel  Hypokalemia Potassium replaced.  Monitor at the facility and replace as needed.  Hyperlipidemia She does not appear to be taking medications for this issue at this time    Discharge plan of care discussed with the patient/family at the bedside.  Answered their questions appropriately to the best of my knowledge.  They verbalized understanding and she is being discharged to the facility in a stable condition.  However, due to her complex medical problems she is high risk for rehospitalization.   Nutrition Documentation    Flowsheet Row ED to Hosp-Admission (Current)  from 09/22/2022 in [facility]  Nutrition Problem Predicted suboptimal nutrient intake   Etiology chronic illness  [dementia]  Nutrition Goal Patient will meet greater than or equal to 90% of their needs  Interventions Ensure Enlive (each supplement provides 350kcal and 20 grams of protein), Magic cup, MVI      Pain control - [place] Controlled Substance Reporting System database was reviewed. and patient was instructed, not to drive, operate heavy machinery, perform activities at heights, swimming or participation in water activities or provide baby-sitting services while on Pain, Sleep and Anxiety Medications; until their outpatient Physician has advised to do so again. Also recommended to not to take more than prescribed Pain, Sleep and Anxiety Medications.  Consultants: Palliative care Disposition: Skilled nursing facility Diet recommendation:  Regular diet DISCHARGE MEDICATION: Allergies as of 09/26/2022       Reactions   Codeine Nausea And Vomiting   Patient stated she was not allergic to codeine anymore         Medication List     STOP taking these medications    cefUROXime 500 MG tablet Commonly known as: CEFTIN       TAKE these medications    amantadine 100 MG capsule Commonly known as: SYMMETREL Take 1 capsule (100 mg total) by mouth 2 (two) times daily.   carbidopa-levodopa 25-100 MG tablet Commonly known as: SINEMET IR Take 1 tablet by mouth 4 (four) times daily.   cyanocobalamin 1000 MCG tablet Commonly known as: VITAMIN B12 Take 1,000 mcg by mouth daily.   DULoxetine 30 MG capsule Commonly known as: CYMBALTA Take 60 mg by mouth daily.   memantine 10 MG tablet Commonly known as: NAMENDA Take 1 tablet (10 mg total) by mouth 2 (two) times daily.   metoprolol tartrate 50 MG tablet Commonly known as: LOPRESSOR Take 1 tablet by mouth daily.   multivitamin tablet Take 1 tablet by mouth daily.        Contact information for after-discharge care     Destination     [facility] Preferred SNF .    Service: Skilled Nursing Contact information: [address] [place] [address] [phone]                    Discharge Exam: Ceasar Mons Weights   09/22/22 0723 09/22/22 2245 09/23/22 1450  Weight: 82.6 kg 72.9 kg 72.2 kg   General: Elderly female, appears calm and comfortable and is in NAD Eyes:  PERRL, EOMI, normal lids, iris ENT:  grossly normal hearing, lips & tongue, mmm Neck:  no LAD, masses or thyromegaly Cardiovascular:  RRR, no m/r/g. No LE edema.  Respiratory:   CTA bilaterally with no wheezes/rales/rhonchi.  Normal respiratory effort. Abdomen:  soft, NT, ND Skin:  no rash or induration seen on limited exam Musculoskeletal:  L foot drop, L pedal weakness, no bony abnormality Psychiatric:  pleasant and upbeat mood and affect, speech fluent and appropriate, AOx3 Neurologic:  diminished L pedal strength and L foot drop (chronic)   Condition at discharge: fair  The results of significant diagnostics from this hospitalization (including imaging, microbiology, ancillary and laboratory) are listed below for reference.   Imaging Studies: MR Lumbar Spine W Wo Contrast  Result Date: 09/22/2022 CLINICAL DATA:  Metastatic disease evaluation diffuse mets on CT, unable to walk x2 days. EXAM: MRI LUMBAR SPINE WITHOUT AND WITH CONTRAST TECHNIQUE: Multiplanar and multiecho pulse sequences of the lumbar spine were obtained without and with  intravenous contrast. CONTRAST:  8mL GADAVIST GADOBUTROL 1 MMOL/ML IV SOLN COMPARISON:  MRI lumbar spine 09/25/2020. FINDINGS: Segmentation: Conventional numbering is assumed with 5 non-rib-bearing, lumbar type vertebral bodies. Alignment:  Normal. Vertebrae: Diffuse bony metastatic disease to the lower thoracic, lumbar spine and sacrum. No evidence of epidural or paraspinal tumor. Prior L4-S1 interbody and posterior spinal fusion. Conus medullaris and cauda equina: Conus extends to the T12-L1 level. Conus and cauda equina appear  normal. No abnormal enhancement. Paraspinal and other soft tissues: No acute findings. Disc levels: T12-L1: Small disc bulge and mild bilateral facet arthropathy. No spinal canal stenosis or neural foraminal narrowing. L1-L2: Disc bulge and facet arthropathy results in moderate spinal canal stenosis and moderate right neural foraminal narrowing, unchanged. L2-L3: Disc bulge and facet arthropathy results in severe spinal canal stenosis, severe left and moderate right neural foraminal narrowing, unchanged. L3-L4: Disc bulge and facet arthropathy results in severe spinal canal stenosis and moderate left neural foraminal narrowing, unchanged. L4-L5: Prior interbody and posterior spinal fusion. No spinal canal stenosis or neural foraminal narrowing. L5-S1: Prior interbody and posterior spinal fusion. No spinal canal stenosis or neural foraminal narrowing. IMPRESSION: 1. Diffuse bony metastatic disease to the lower thoracic, lumbar spine and sacrum. No evidence of epidural or paraspinal tumor. 2. Unchanged multilevel lumbar spondylosis, worst at L2-3 and L3-4, where there is severe spinal canal stenosis at L2-L3 and L3-L4. 3. Unchanged moderate spinal canal stenosis at L1-L2. Electronically Signed   By: Orvan Falconer M.D.   On: 09/22/2022 20:04   CT CHEST ABDOMEN PELVIS W CONTRAST  Result Date: 09/22/2022 CLINICAL DATA:  Osseous metastases suspected. Unknown primary tumor. EXAM: CT CHEST, ABDOMEN, AND PELVIS WITH CONTRAST TECHNIQUE: Multidetector CT imaging of the chest, abdomen and pelvis was performed following the standard protocol during bolus administration of intravenous contrast. RADIATION DOSE REDUCTION: This exam was performed according to the departmental dose-optimization program which includes automated exposure control, adjustment of the mA and/or kV according to patient size and/or use of iterative reconstruction technique. CONTRAST:  OMNIPAQUE IOHEXOL 300 MG/ML  SOLN COMPARISON:  Pelvic CT  08/25/2020 FINDINGS: CT CHEST FINDINGS Cardiovascular: Heart is normal in size. Thoracic aorta is normal caliber. Calcified plaque throughout the thoracic aorta. Pulmonary arterial system is well opacified. Remaining vascular structures are unremarkable. Mediastinum/Nodes: Moderate mediastinal and bilateral hilar adenopathy is present. 2.6 cm subcarinal lymph node, 1.7 cm AP window lymph node, 1.7 cm right hilar lymph node and 1 cm left hilar lymph node. Remaining mediastinal structures are unremarkable. No axillary adenopathy. Lungs/Pleura: Lungs are adequately inflated without focal airspace consolidation or effusion. There are a few peripheral and subpleural nodular densities bilaterally as some of these are along the fissures. Largest nodule within the right lung is over the lower lobe measuring 7 mm (image 103). Largest nodule within the right lung over the lingula measuring 5-6 mm (image 100). No effusion. Airways are normal. Musculoskeletal: Patchy lytic lesions over the thoracic spine and a few posterior ribs suggesting metastatic disease. CT ABDOMEN PELVIS FINDINGS Hepatobiliary: Nodular contour of the liver suggesting cirrhosis. Previous cholecystectomy. Biliary tree is normal. Pancreas: Normal. Spleen: Several hypodense masses within the spleen with the largest over the left anterolateral aspect measuring 2.6 cm suggesting metastatic disease. Adrenals/Urinary Tract: Adrenal glands are normal and symmetric. Kidneys are normal in size without hydronephrosis. No focal mass. Ureters and bladder are normal. Stomach/Bowel: Stomach is under distended. Small bowel is within normal. Appendix is normal. Diverticulosis of the colon. Vascular/Lymphatic: Minimal calcified plaque over  the abdominal aorta which is normal in caliber. Remaining vascular structures are unremarkable. No adenopathy in the abdomen/pelvis. Reproductive: Uterus and bilateral adnexa are unremarkable. Other: No free fluid or focal inflammatory  change. Nonspecific rim calcified structure over the anterior left lower quadrant measuring 2.4 cm which is mobile as this was present on the previous pelvic CT over the left lower quadrant. Musculoskeletal: Posterior fusion hardware over the lumbosacral spine. Patchy lytic lesions throughout the thoracolumbar spine with several small lytic lesions over the pelvic bones suggesting metastatic disease. IMPRESSION: 1. Moderate mediastinal and bilateral hilar adenopathy as described. 2. Several peripheral and subpleural nodular densities bilaterally some along the fissures. Largest nodule within the right lung over the lower lobe measuring 7 mm. Largest nodule within the right lung over the lingula measuring 5-6 mm. Findings are nonspecific as metastatic disease is possible as recommend attention on follow-up. 3. Several hypodense masses within the spleen with the largest over the left anterolateral aspect measuring 2.6 cm suggesting metastatic disease. 4. Patchy lytic lesions throughout the thoracolumbar spine with several small lytic lesions over the pelvic bones suggesting metastatic disease. 5. Morphologic liver changes suggesting cirrhosis. 6. Diverticulosis of the colon. 7. Aortic atherosclerosis. Aortic Atherosclerosis (ICD10-I70.0). Electronically Signed   By: Elberta Fortis M.D.   On: 09/22/2022 11:21   CT Head Wo Contrast  Result Date: 09/22/2022 CLINICAL DATA:  Delirium. EXAM: CT HEAD WITHOUT CONTRAST TECHNIQUE: Contiguous axial images were obtained from the base of the skull through the vertex without intravenous contrast. RADIATION DOSE REDUCTION: This exam was performed according to the departmental dose-optimization program which includes automated exposure control, adjustment of the mA and/or kV according to patient size and/or use of iterative reconstruction technique. COMPARISON:  05/30/2021 brain MRI FINDINGS: Brain: No evidence of acute infarction, hemorrhage, hydrocephalus, extra-axial collection  or mass lesion/mass effect. Chronic small vessel ischemia in the cerebral white matter. Mild cerebral volume loss Vascular: No hyperdense vessel or unexpected calcification. Skull: Multiple lytic lesions in the calvarium not seen on prior brain MRI, 1 of the largest along the right aspect of the coronal suture measuring 16 mm on 2:25. additional notable lesion at the upper right greater sphenoid wing measuring 2.1 cm, with inner table erosion. Sinuses/Orbits: Negative IMPRESSION: 1. Multiple lytic lesions in the calvarium, not present on brain MRI from last year, concerning for metastatic disease or myeloma. 2. No acute intracranial finding. Electronically Signed   By: Tiburcio Pea M.D.   On: 09/22/2022 08:50   DG Chest Portable 1 View  Result Date: 09/22/2022 CLINICAL DATA:  Altered mental status with weakness and foul-smelling urine. EXAM: PORTABLE CHEST 1 VIEW COMPARISON:  07/16/2021, mammogram 01/26/2015 FINDINGS: Lungs are adequately inflated without focal airspace consolidation or effusion. Cardiomediastinal silhouette is normal. Extensive benign dystrophic calcification over the left breast soft tissues. Remainder the exam is unchanged. IMPRESSION: 1. No acute cardiopulmonary disease. 2. Extensive calcification over the left breast soft tissues. Electronically Signed   By: Elberta Fortis M.D.   On: 09/22/2022 07:53    Microbiology: Results for orders placed or performed during the hospital encounter of 09/22/22  SARS Coronavirus 2 by RT PCR (hospital order, performed in [facility] hospital lab) *cepheid single result test* Anterior Nasal Swab     Status: None   Collection Time: 09/22/22  8:16 AM   Specimen: Anterior Nasal Swab  Result Value Ref Range Status   SARS Coronavirus 2 by RT PCR NEGATIVE NEGATIVE Final    Comment: (NOTE) SARS-CoV-2 target nucleic acids  are NOT DETECTED.  The SARS-CoV-2 RNA is generally detectable in upper and lower respiratory specimens during the acute phase of  infection. The lowest concentration of SARS-CoV-2 viral copies this assay can detect is 250 copies / mL. A negative result does not preclude SARS-CoV-2 infection and should not be used as the sole basis for treatment or other patient management decisions.  A negative result may occur with improper specimen collection / handling, submission of specimen other than nasopharyngeal swab, presence of viral mutation(s) within the areas targeted by this assay, and inadequate number of viral copies (<250 copies / mL). A negative result must be combined with clinical observations, patient history, and epidemiological information.  Fact Sheet for Patients:   RoadLapTop.co.za  Fact Sheet for Healthcare Providers: http://kim-miller.com/  This test is not yet approved or  cleared by the [place] FDA and has been authorized for detection and/or diagnosis of SARS-CoV-2 by FDA under an Emergency Use Authorization (EUA).  This EUA will remain in effect (meaning this test can be used) for the duration of the COVID-19 declaration under Section 564(b)(1) of the Act, 21 U.S.C. section 360bbb-3(b)(1), unless the authorization is terminated or revoked sooner.  Performed at [facility], [address] [place], [place] [address]   MRSA Next Gen by PCR, Nasal     Status: None   Collection Time: 09/22/22 10:15 PM   Specimen: Nasal Mucosa; Nasal Swab  Result Value Ref Range Status   MRSA by PCR Next Gen NOT DETECTED NOT DETECTED Final    Comment: (NOTE) The GeneXpert MRSA Assay (FDA approved for NASAL specimens only), is one component of a comprehensive MRSA colonization surveillance program. It is not intended to diagnose MRSA infection nor to guide or monitor treatment for MRSA infections. Test performance is not FDA approved in patients less than 28 years old. Performed at [facility], [address] [place], [place] [address]   MRSA Next Gen by PCR,  Nasal     Status: None   Collection Time: 09/22/22 10:50 PM   Specimen: Nasal Mucosa; Nasal Swab  Result Value Ref Range Status   MRSA by PCR Next Gen NOT DETECTED NOT DETECTED Final    Comment: (NOTE) The GeneXpert MRSA Assay (FDA approved for NASAL specimens only), is one component of a comprehensive MRSA colonization surveillance program. It is not intended to diagnose MRSA infection nor to guide or monitor treatment for MRSA infections. Test performance is not FDA approved in patients less than 62 years old. Performed at [facility], [address] [place], [place] [address]     Labs: CBC: Recent Labs  Lab 09/22/22 0803 09/23/22 0427 09/24/22 0421 09/25/22 0424 09/26/22 0421  WBC 8.0 8.2 5.3 6.4 7.9  NEUTROABS 6.4 6.2 3.9 4.3 6.0  HGB 12.8 12.0 9.5* 10.8* 11.8*  HCT 40.3 37.9 31.6* 36.3 37.1  MCV 80.9 80.3 82.9 83.4 80.0  PLT 265 259 178 212 264   Basic Metabolic Panel: Recent Labs  Lab 09/22/22 0803 09/23/22 0427 09/24/22 0421 09/25/22 0424 09/26/22 0421  NA 135 136 140 137 134*  K 3.0* 3.1* 2.7* 3.9 3.4*  CL 97* 102 114* 107 103  CO2 26 23 19* 21* 22  GLUCOSE 119* 97 83 85 151*  BUN 23 15 15 13 11   CREATININE 1.00 0.65 0.50 0.64 0.63  CALCIUM 9.0 8.5* 7.0* 8.5* 8.5*  MG  --  1.7  --   --   --    Liver Function Tests: Recent Labs  Lab 09/22/22 0803 09/23/22 0427  AST 31 32  ALT 10 8  ALKPHOS 329* 371*  BILITOT 0.9 0.8  PROT 7.1 6.6  ALBUMIN 3.3* 2.9*   CBG: No results for input(s): "GLUCAP" in the last 168 hours.  Discharge time spent: greater than 30 minutes.  Signed: Vonzella Nipple, MD [facility] 09/26/2022

## 2022-09-27 DIAGNOSIS — E876 Hypokalemia: Secondary | ICD-10-CM | POA: Diagnosis not present

## 2022-09-27 DIAGNOSIS — E785 Hyperlipidemia, unspecified: Secondary | ICD-10-CM | POA: Diagnosis not present

## 2022-09-27 DIAGNOSIS — R531 Weakness: Secondary | ICD-10-CM | POA: Diagnosis not present

## 2022-09-27 DIAGNOSIS — I10 Essential (primary) hypertension: Secondary | ICD-10-CM | POA: Diagnosis not present

## 2022-09-27 DIAGNOSIS — G20A1 Parkinson's disease without dyskinesia, without mention of fluctuations: Secondary | ICD-10-CM | POA: Diagnosis not present

## 2022-09-27 DIAGNOSIS — C7931 Secondary malignant neoplasm of brain: Secondary | ICD-10-CM | POA: Diagnosis not present

## 2022-09-27 DIAGNOSIS — C410 Malignant neoplasm of bones of skull and face: Secondary | ICD-10-CM | POA: Diagnosis not present

## 2022-09-27 DIAGNOSIS — Z9181 History of falling: Secondary | ICD-10-CM | POA: Diagnosis not present

## 2022-09-27 DIAGNOSIS — C9000 Multiple myeloma not having achieved remission: Secondary | ICD-10-CM | POA: Diagnosis not present

## 2022-09-27 DIAGNOSIS — Z8542 Personal history of malignant neoplasm of other parts of uterus: Secondary | ICD-10-CM | POA: Diagnosis not present

## 2022-09-27 DIAGNOSIS — Z853 Personal history of malignant neoplasm of breast: Secondary | ICD-10-CM | POA: Diagnosis not present

## 2022-09-27 DIAGNOSIS — M899 Disorder of bone, unspecified: Secondary | ICD-10-CM | POA: Diagnosis not present

## 2022-09-27 DIAGNOSIS — Z79899 Other long term (current) drug therapy: Secondary | ICD-10-CM | POA: Diagnosis not present

## 2022-09-27 DIAGNOSIS — E119 Type 2 diabetes mellitus without complications: Secondary | ICD-10-CM | POA: Diagnosis not present

## 2022-09-27 DIAGNOSIS — F0390 Unspecified dementia without behavioral disturbance: Secondary | ICD-10-CM | POA: Diagnosis not present

## 2022-09-27 DIAGNOSIS — R41841 Cognitive communication deficit: Secondary | ICD-10-CM | POA: Diagnosis not present

## 2022-09-27 DIAGNOSIS — C720 Malignant neoplasm of spinal cord: Secondary | ICD-10-CM | POA: Diagnosis not present

## 2022-09-27 LAB — CBC
HCT: 34.1 % — ABNORMAL LOW (ref 36.0–46.0)
Hemoglobin: 10.6 g/dL — ABNORMAL LOW (ref 12.0–15.0)
MCH: 25.2 pg — ABNORMAL LOW (ref 26.0–34.0)
MCHC: 31.1 g/dL (ref 30.0–36.0)
MCV: 81 fL (ref 80.0–100.0)
Platelets: 220 10*3/uL (ref 150–400)
RBC: 4.21 MIL/uL (ref 3.87–5.11)
RDW: 14.8 % (ref 11.5–15.5)
WBC: 6.4 10*3/uL (ref 4.0–10.5)
nRBC: 0 % (ref 0.0–0.2)

## 2022-09-27 LAB — BASIC METABOLIC PANEL
Anion gap: 8 (ref 5–15)
BUN: 11 mg/dL (ref 8–23)
CO2: 22 mmol/L (ref 22–32)
Calcium: 8.5 mg/dL — ABNORMAL LOW (ref 8.9–10.3)
Chloride: 106 mmol/L (ref 98–111)
Creatinine, Ser: 0.64 mg/dL (ref 0.44–1.00)
GFR, Estimated: >60 mL/min (ref >60)
Glucose, Bld: 94 mg/dL (ref 70–99)
Potassium: 3.5 mmol/L (ref 3.5–5.1)
Sodium: 136 mmol/L (ref 135–145)

## 2022-09-27 LAB — MAGNESIUM: Magnesium: 1.7 mg/dL (ref 1.7–2.4)

## 2022-09-27 MED ORDER — OXYCODONE HCL 5 MG PO TABS: 5.0000 mg | ORAL_TABLET | Freq: Four times a day (QID) | ORAL | 0 refills | Status: AC | PRN

## 2022-09-27 MED ORDER — ACETAMINOPHEN 325 MG PO TABS: 650.0000 mg | ORAL_TABLET | Freq: Four times a day (QID) | ORAL | Status: AC | PRN

## 2022-09-27 MED ORDER — ORAL CARE MOUTH RINSE: 15.0000 mL | OROMUCOSAL | Status: DC | PRN

## 2022-09-27 MED ORDER — MAGNESIUM OXIDE -MG SUPPLEMENT 400 (240 MG) MG PO TABS: 400.0000 mg | ORAL_TABLET | Freq: Once | ORAL | Status: DC

## 2022-09-27 MED ORDER — ONDANSETRON HCL 4 MG PO TABS: 4.0000 mg | ORAL_TABLET | Freq: Three times a day (TID) | ORAL | 0 refills | Status: AC | PRN

## 2022-09-27 MED ORDER — POTASSIUM CHLORIDE CRYS ER 20 MEQ PO TBCR
40.0000 meq | EXTENDED_RELEASE_TABLET | Freq: Once | ORAL | Status: AC
  Administered 2022-09-27: 40 meq via ORAL
  Filled 2022-09-27: qty 2

## 2022-09-27 MED ORDER — AMLODIPINE BESYLATE 5 MG PO TABS: 5.0000 mg | ORAL_TABLET | Freq: Every day | ORAL | 1 refills | Status: AC

## 2022-09-27 NOTE — TOC Transition Note (Signed)
Transition of Care [facility]) - CM/SW Discharge Note   Patient Details  Name: Vanessa Stephens MRN: [id number] Date of Birth: [birth year]  Transition of Care [facility]) CM/SW Contact:  Villa Herb, LCSWA Phone Number: 09/27/2022, 1:36 PM   Clinical Narrative:    CSW updated that [facility] has a bed for pt today. CSW sent D/C clinicals to facility. CSW updated RN of room and report numbers. Med necessity completed and printed to the floor for RN. Pts daughter updated on D/C to [facility] today. EMS called for transport. TOC signing off.   Final next level of care: Skilled Nursing Facility Barriers to Discharge: Barriers Resolved   Patient Goals and CMS Choice CMS Medicare.gov Compare Post Acute Care list provided to:: Patient Represenative (must comment) Choice offered to / list presented to : Adult Children  Discharge Placement                  Patient to be transferred to facility by: EMS Name of family member notified: daughter Lelon Mast Patient and family notified of of transfer: 09/27/22  Discharge Plan and Services Additional resources added to the After Visit Summary for                                       Social Determinants of Health (SDOH) Interventions SDOH Screenings   Food Insecurity: No Food Insecurity (09/22/2022)  Housing: Patient Declined (09/22/2022)  Transportation Needs: No Transportation Needs (09/22/2022)  Utilities: Not At Risk (09/22/2022)  Financial Resource Strain: Low Risk  (10/02/2021)   [facility] from [facility]  Tobacco Use: Medium Risk (09/25/2022)     Readmission Risk Interventions    09/24/2022    3:37 PM  Readmission Risk Prevention Plan  Transportation Screening Complete  PCP or Specialist Appt within 5-7 Days Not Complete  Home Care Screening Complete  Medication Review (RN CM) Complete

## 2022-09-27 NOTE — Progress Notes (Signed)
Physical Therapy Treatment Patient Details Name: Vanessa Stephens MRN: [id number] DOB: 15-Jun-1950 Today's Date: 09/27/2022   History of Present Illness per MD:" Vanessa Stephens is a 72 y.o. female with medical history significant of cancer, diabetes mellitus, Parkinson's dementia, hypertension, and more presents the ED with a chief complaint of generalized weakness.  Patient reports that she fell down a couple of days ago and hurt her right leg.  She reports it has been healing well.  And she thinks that is why she is in the hospital.  When reminded that she was weak today, she reports that she remembers not being able to stand up.  She reports she has not been eating or drinking at home.  She says it is because she has not had any appetite.  She also reports that her caretaker had left for a week.  She said just friends and people were helping her during the week that her caretaker was gone, but she is very vague about it.  Patient denies any chest pain, nausea, vomiting, diarrhea, dysuria, fever, dyspnea.  She is normally able to ambulate with a walker and today could not even stand up.  Patient has no other complaints at this time."    PT Comments  Patient demonstrates slow labored movement for sitting up at bedside, once seated had frequent left lateral leaning and occasional loss of balance due to posterior leaning, very unsteady on feet and limited to a few side steps with shuffling on LLE due to weakness.  Patient demonstrates fair/good return for completing BLE ROM/strengthening exercises, but limited mostly due to poor trunk control due to posterior, left lateral leaning.  Patient tolerated sitting up in chair after therapy.  Patient will benefit from continued skilled physical therapy in hospital and recommended venue below to increase strength, balance, endurance for safe ADLs and gait.        If plan is discharge home, recommend the following: A lot of help with walking and/or transfers;A  little help with bathing/dressing/bathroom;Assistance with cooking/housework;Assist for transportation;A lot of help with bathing/dressing/bathroom;Help with stairs or ramp for entrance   Can travel by private vehicle     No  Equipment Recommendations  None recommended by PT    Recommendations for Other Services       Precautions / Restrictions Precautions Precautions: Fall Restrictions Weight Bearing Restrictions: No     Mobility  Bed Mobility Overal bed mobility: Needs Assistance Bed Mobility: Supine to Sit     Supine to sit: Mod assist     General bed mobility comments: slow labored movement with frequent leaning to left side    Transfers Overall transfer level: Needs assistance Equipment used: Rolling walker (2 wheels) Transfers: Sit to/from Stand, Bed to chair/wheelchair/BSC Sit to Stand: Mod assist Stand pivot transfers: Mod assist, Max assist         General transfer comment: unsteady labored movement with difficulty advancing LLE due to weakness    Ambulation/Gait Ambulation/Gait assistance: Mod assist, Max assist Gait Distance (Feet): 4 Feet Assistive device: Rolling walker (2 wheels) Gait Pattern/deviations: Decreased step length - right, Decreased step length - left, Decreased stride length, Shuffle Gait velocity: slow     General Gait Details: slow labored unteady movement with frequent shuffling of LLE due to weakness   Stairs             Wheelchair Mobility     Tilt Bed    Modified Rankin (Stroke Patients Only)  Balance Overall balance assessment: Needs assistance Sitting-balance support: Feet supported, No upper extremity supported Sitting balance-Leahy Scale: Poor Sitting balance - Comments: fair/poor with frequent left lateral leaning Postural control: Left lateral lean Standing balance support: Reliant on assistive device for balance, During functional activity, Bilateral upper extremity supported Standing  balance-Leahy Scale: Poor Standing balance comment: using RW, left lateral leaning                            Cognition Arousal: Alert Behavior During Therapy: WFL for tasks assessed/performed Overall Cognitive Status: Within Functional Limits for tasks assessed                                          Exercises General Exercises - Lower Extremity Long Arc Quad: Seated, AROM, Strengthening, Both, 10 reps Hip Flexion/Marching: Seated, AROM, Strengthening, Both, 10 reps Toe Raises: Seated, AROM, Strengthening, Both, 10 reps Heel Raises: Seated, AROM, Strengthening, Both, 10 reps    General Comments        Pertinent Vitals/Pain Pain Assessment Pain Assessment: 0-10 Pain Score: 5  Pain Location: right shoulder with movement Pain Descriptors / Indicators: Sore Pain Intervention(s): Limited activity within patient's tolerance, Monitored during session, Repositioned    Home Living                          Prior Function            PT Goals (current goals can now be found in the care plan section) Progress towards PT goals: Progressing toward goals    Frequency    Min 3X/week      PT Plan      Co-evaluation              AM-PAC PT "6 Clicks" Mobility   Outcome Measure  Help needed turning from your back to your side while in a flat bed without using bedrails?: A Little Help needed moving from lying on your back to sitting on the side of a flat bed without using bedrails?: A Lot Help needed moving to and from a bed to a chair (including a wheelchair)?: A Lot Help needed standing up from a chair using your arms (e.g., wheelchair or bedside chair)?: A Lot Help needed to walk in hospital room?: A Lot Help needed climbing 3-5 steps with a railing? : A Lot 6 Click Score: 13    End of Session   Activity Tolerance: Patient tolerated treatment well;Patient limited by fatigue Patient left: in chair;with call bell/phone within  reach Nurse Communication: Mobility status PT Visit Diagnosis: Unsteadiness on feet (R26.81);Other abnormalities of gait and mobility (R26.89);Muscle weakness (generalized) (M62.81)     Time: 4010-2725 PT Time Calculation (min) (ACUTE ONLY): 33 min  Charges:    $Therapeutic Exercise: 8-22 mins $Therapeutic Activity: 8-22 mins PT General Charges $$ ACUTE PT VISIT: 1 Visit                     12:41 PM, 09/27/22 Ocie Bob, MPT Physical Therapist with [facility] 336 [phone] office [phone] mobile phone

## 2022-09-27 NOTE — Progress Notes (Signed)
Nutrition Follow-up  DOCUMENTATION CODES:   Not applicable  INTERVENTION:   -Continue Ensure Enlive po BID, each supplement provides 350 kcal and 20 grams of protein -Continue Magic cup BID with meals, each supplement provides 290 kcal and 9 grams of protein  -Continue MVI with minerals daily -Continue feeding assistance with meals -Continue regular diet  NUTRITION DIAGNOSIS:   Predicted suboptimal nutrient intake related to chronic illness (dementia) as evidenced by estimated needs.  Ongoing  GOAL:   Patient will meet greater than or equal to 90% of their needs  Unmet  MONITOR:   PO intake, Supplement acceptance  REASON FOR ASSESSMENT:   Malnutrition Screening Tool    ASSESSMENT:   Pt with medical history significant of cancer, diabetes mellitus, Parkinson's dementia, hypertension, and more presents with a chief complaint of generalized weakness.  Reviewed I/O's: +620 ml x 24 hours and +3 L since admission  UOP: 700 ml x 24 hours   Pt unavailable at time of visit. Attempted to speak with pt via call to hospital room phone, however, unable to reach. RD unable to obtain further nutrition-related history or complete nutrition-focused physical exam at this time.    Case discussed with MD and during progression rounds. Palliative care also following; no CPR or intubation but continue to treat the treatable. Pt does not desire further cancer work-up.   Pt remains with minimal oral intake. Noted meal completion 0-25%. Pt with variable acceptance of supplements.   Per MD, plan to d/c to SNF today. Pt was cleared to go yesterday, however, bed unavailable.   Wt has been stable since admission.   Medications reviewed and include 0.9% sodium chloride infusion @ 75 ml/hr.   Labs reviewed: CBGS: 206 (inpatient orders for glycemic control are none).    Diet Order:   Diet Order             Diet regular Fluid consistency: Thin  Diet effective now                    EDUCATION NEEDS:   No education needs have been identified at this time  Skin:  Skin Assessment: Reviewed RN Assessment  Last BM:  09/22/22  Height:   Ht Readings from Last 1 Encounters:  09/23/22 5\' 4"  (1.626 m)    Weight:   Wt Readings from Last 1 Encounters:  09/23/22 72.2 kg    Ideal Body Weight:  54.5 kg  BMI:  Body mass index is 27.32 kg/m.  Estimated Nutritional Needs:   Kcal:  1650-1850  Protein:  80-95 grams  Fluid:  > 1.6 L    Levada Schilling, RD, LDN, CDCES Registered Dietitian II Certified Diabetes Care and Education Specialist Please refer to [facility] for RD and/or RD on-call/weekend/after hours pager

## 2022-09-27 NOTE — Consult Note (Signed)
[facility] Customer service manager [facility]) Accountable Care Organization (ACO) [facility] Liaison Note  09/27/2022  Vanessa Stephens [birth year] [id number]  Location: [facility] Liaison screened the patient remotely at [facility].  Insurance: [employer] Advantage   Vanessa Stephens is a 72 y.o. adult who is a Primary Care Patient of Vyas, Angelina Pih, MD [facility] Internal Medicine. The patient was screened for readmission hospitalization with noted medium risk score for unplanned readmission risk with 1 IP in 6 months.  The patient was assessed for potential [facility]) Care Management service needs for post hospital transition for care coordination. Review of patient's electronic medical record reveals patient was admitted for metastatic malignant (general weakness). TOC indicates pt awaiting bed placement for SNF level of care to [facility] for a discharge today. Facility will continue to provide care management needs for this pt post discharge.  Plan: [facility] Liaison will continue to follow progress and disposition to asess for post hospital community care coordination/management needs.  Referral request for community care coordination: pending disposition.   [facility] Care Management/Population Health does not replace or interfere with any arrangements made by the Inpatient Transition of Care team.   For questions contact:   Elliot Cousin, RN, [facility] Liaison [place]   Population Health Office Hours MTWF  8:00 am-6:00 pm Off on Thursday [phone] mobile [phone] [Office toll free line] Office Hours are M-F 8:30 - 5 pm [email or web address]

## 2022-09-27 NOTE — Discharge Instructions (Signed)
1)Repeat CBC and BMP in 1 week

## 2022-09-27 NOTE — Discharge Summary (Signed)
Vanessa Stephens, is a 72 y.o. adult  DOB 1950-08-09  MRN [id number].  Admission date:  09/22/2022  Admitting Physician  Jonah Blue, MD  Discharge Date:  09/27/2022   Primary MD  Ignatius Specking, MD  Recommendations for primary care physician for things to follow:   1)Repeat CBC and BMP in 1 week  Admission Diagnosis  Generalized weakness [R53.1] Gait instability [R26.81]  Discharge Diagnosis  Generalized weakness [R53.1] Gait instability [R26.81]    Principal Problem:   Generalized weakness Active Problems:   Hyperlipidemia   Hypokalemia   Parkinson's disease   Dementia (HCC)   Lytic lesion of bone on x-ray   DNR (do not resuscitate)   Goals of care, counseling/discussion      Past Medical History:  Diagnosis Date   Cancer (HCC)    Diabetes mellitus without complication (HCC)    Hypertension     Past Surgical History:  Procedure Laterality Date   BACK SURGERY     BREAST LUMPECTOMY     CHOLECYSTECTOMY      HPI  from the history and physical done on the day of admission:  HPI: Vanessa Stephens is a 72 y.o. female with medical history significant of cancer, diabetes mellitus, Parkinson's dementia, hypertension, and more presents the ED with a chief complaint of generalized weakness.  Patient reports that she fell down a couple of days ago and hurt her right leg.  She reports it has been healing well.  And she thinks that is why she is in the hospital.  When reminded that she was weak today, she reports that she remembers not being able to stand up.  She reports she has not been eating or drinking at home.  She says it is because she has not had any appetite.  She also reports that her caretaker had left for a week.  She said just friends and people were helping her during the week that her caretaker was gone, but she is very vague about it.  Patient denies any chest pain, nausea, vomiting,  diarrhea, dysuria, fever, dyspnea.  She is normally able to ambulate with a walker and today could not even stand up.  Patient has no other complaints at this time.   DNR as per ACP documents. Review of Systems: As mentioned in the history of present illness. All other systems reviewed and are negative.   Hospital Course:    Patient is a 72 yo with h/o DM, Parkinson's with dementia, and HTN who presented on 8/18 with generalized weakness, dehydration. Head CT with lytic lesions in calvarium, MRI L-spine with diffuse bony metastatic disease. She has a prior h/o uterine and breast cancer. Palliative care has met with patient/family and they are in agreement that she would not want to proceed with further evaluation and treatment of malignancy. She has declined oncology consultation and will proceed with SNF rehab with the hope of returning home. She is hospice appropriate but does not appear to need residential hospice at this time.  Assessment and Plan:  1)Generalized weakness Patient is normally able to ambulate with a walker On admission, she could not even stand up COVID negative CT head shows metastatic disease with lytic lesions in the calvarium MRI lumbar spine also shows diffuse bony metastatic disease to the lower thoracic, lumbar, and sacrum PT/OT consulted and recommend SNF rehab She has been accepted to [facility] rehab    2)Goals of care, counseling/discussion She is DNR, ACP documents present Based on advancing dementia,  -GOC conversation with patient and  her granddaughter Patient and Granddaughter decline  further evaluation and treatment of metastatic cancer. -Declined pulm/IR/oncology consults  -Palliative care consult appreciated Outpatient palliative care vs. Transition to hospice (outpatient)   3)DNR (do not resuscitate) DNR order in ACP documents, reviewed on admission   4)Lytic lesion of bone on x-ray Imaging with lytic lesions of calvarium, lower thoracic, lumbar,  sacrum Possible new diagnosis of multiple myeloma but metastatic disease appears more likely  Discussed with oncology and she would need a tissue biopsy as the next step - either bronch with tissue diagnosis or bone biopsy with IR Either of these options would require evaluation in [place], inpatient vs. Outpatient Patient was d/w Drs. Smith and Icard from pulm, who are willing to do virtual evaluation and/or inpatient/outpatient evaluation as needed Discussed GOC with family  Patient and Family does not want to pursue further testing at this time, declined oncology evaluation  -As needed oxycodone and Tylenol as prescribed   5)Dementia (HCC) -With mild  cognitive and memory deficits Continue Namenda, Symmetrel Continue to reorient as needed Delirium precautions  6)HTN--- BP is not at goal,  -continue metoprolol, add amlodipine for better BP control  7) chronic anemia--- overall stable,  -Hgb currently above 10  -Repeat CBC within a week or so   Parkinson's disease Continue Sinemet Continue Symmetrel   Hypokalemia Potassium replaced.   -Repeat BMP within a week  Hyperlipidemia She does not appear to be taking medications for this issue at this time  -Given overall poor prognosis would not initiate statin at this time  Discharge Condition: stable  Follow UP   Contact information for after-discharge care     Destination     [facility] Preferred SNF .   Service: Skilled Nursing Contact information: [address] [place] [address] [phone]                     Consults obtained -palliative care  Diet and Activity recommendation:  As advised  Discharge Instructions    Discharge Instructions     Call MD for:  difficulty breathing, headache or visual disturbances   Complete by: As directed    Call MD for:  persistant dizziness or light-headedness   Complete by: As directed    Call MD for:  persistant nausea  and vomiting   Complete by: As directed    Call MD for:  temperature >100.4   Complete by: As directed    Diet general   Complete by: As directed    Discharge instructions   Complete by: As directed    1)Repeat CBC and BMP in 1 week   Increase activity slowly   Complete by: As directed         Discharge Medications     Allergies as of 09/27/2022       Reactions   Codeine Nausea And Vomiting   Patient stated she was not allergic to codeine anymore  Medication List     STOP taking these medications    cefUROXime 500 MG tablet Commonly known as: CEFTIN       TAKE these medications    acetaminophen 325 MG tablet Commonly known as: TYLENOL Take 2 tablets (650 mg total) by mouth every 6 (six) hours as needed for mild pain, fever or headache (or Fever >/= 101).   amantadine 100 MG capsule Commonly known as: SYMMETREL Take 1 capsule (100 mg total) by mouth 2 (two) times daily.   amLODipine 5 MG tablet Commonly known as: NORVASC Take 1 tablet (5 mg total) by mouth daily. For BP   carbidopa-levodopa 25-100 MG tablet Commonly known as: SINEMET IR Take 1 tablet by mouth 4 (four) times daily.   cyanocobalamin 1000 MCG tablet Commonly known as: VITAMIN B12 Take 1,000 mcg by mouth daily.   DULoxetine 30 MG capsule Commonly known as: CYMBALTA Take 60 mg by mouth daily.   memantine 10 MG tablet Commonly known as: NAMENDA Take 1 tablet (10 mg total) by mouth 2 (two) times daily.   metoprolol tartrate 50 MG tablet Commonly known as: LOPRESSOR Take 1 tablet by mouth daily.   multivitamin tablet Take 1 tablet by mouth daily.   ondansetron 4 MG tablet Commonly known as: ZOFRAN Take 1 tablet (4 mg total) by mouth every 8 (eight) hours as needed for nausea.   oxyCODONE 5 MG immediate release tablet Commonly known as: Oxy IR/ROXICODONE Take 1 tablet (5 mg total) by mouth every 6 (six) hours as needed for moderate pain.       Major procedures and  Radiology Reports - PLEASE review detailed and final reports for all details, in brief -   MR Lumbar Spine W Wo Contrast  Result Date: 09/22/2022 CLINICAL DATA:  Metastatic disease evaluation diffuse mets on CT, unable to walk x2 days. EXAM: MRI LUMBAR SPINE WITHOUT AND WITH CONTRAST TECHNIQUE: Multiplanar and multiecho pulse sequences of the lumbar spine were obtained without and with intravenous contrast. CONTRAST:  8mL GADAVIST GADOBUTROL 1 MMOL/ML IV SOLN COMPARISON:  MRI lumbar spine 09/25/2020. FINDINGS: Segmentation: Conventional numbering is assumed with 5 non-rib-bearing, lumbar type vertebral bodies. Alignment:  Normal. Vertebrae: Diffuse bony metastatic disease to the lower thoracic, lumbar spine and sacrum. No evidence of epidural or paraspinal tumor. Prior L4-S1 interbody and posterior spinal fusion. Conus medullaris and cauda equina: Conus extends to the T12-L1 level. Conus and cauda equina appear normal. No abnormal enhancement. Paraspinal and other soft tissues: No acute findings. Disc levels: T12-L1: Small disc bulge and mild bilateral facet arthropathy. No spinal canal stenosis or neural foraminal narrowing. L1-L2: Disc bulge and facet arthropathy results in moderate spinal canal stenosis and moderate right neural foraminal narrowing, unchanged. L2-L3: Disc bulge and facet arthropathy results in severe spinal canal stenosis, severe left and moderate right neural foraminal narrowing, unchanged. L3-L4: Disc bulge and facet arthropathy results in severe spinal canal stenosis and moderate left neural foraminal narrowing, unchanged. L4-L5: Prior interbody and posterior spinal fusion. No spinal canal stenosis or neural foraminal narrowing. L5-S1: Prior interbody and posterior spinal fusion. No spinal canal stenosis or neural foraminal narrowing. IMPRESSION: 1. Diffuse bony metastatic disease to the lower thoracic, lumbar spine and sacrum. No evidence of epidural or paraspinal tumor. 2. Unchanged  multilevel lumbar spondylosis, worst at L2-3 and L3-4, where there is severe spinal canal stenosis at L2-L3 and L3-L4. 3. Unchanged moderate spinal canal stenosis at L1-L2. Electronically Signed   By: Orvan Falconer M.D.   On:  09/22/2022 20:04   CT CHEST ABDOMEN PELVIS W CONTRAST  Result Date: 09/22/2022 CLINICAL DATA:  Osseous metastases suspected. Unknown primary tumor. EXAM: CT CHEST, ABDOMEN, AND PELVIS WITH CONTRAST TECHNIQUE: Multidetector CT imaging of the chest, abdomen and pelvis was performed following the standard protocol during bolus administration of intravenous contrast. RADIATION DOSE REDUCTION: This exam was performed according to the departmental dose-optimization program which includes automated exposure control, adjustment of the mA and/or kV according to patient size and/or use of iterative reconstruction technique. CONTRAST:  OMNIPAQUE IOHEXOL 300 MG/ML  SOLN COMPARISON:  Pelvic CT 08/25/2020 FINDINGS: CT CHEST FINDINGS Cardiovascular: Heart is normal in size. Thoracic aorta is normal caliber. Calcified plaque throughout the thoracic aorta. Pulmonary arterial system is well opacified. Remaining vascular structures are unremarkable. Mediastinum/Nodes: Moderate mediastinal and bilateral hilar adenopathy is present. 2.6 cm subcarinal lymph node, 1.7 cm AP window lymph node, 1.7 cm right hilar lymph node and 1 cm left hilar lymph node. Remaining mediastinal structures are unremarkable. No axillary adenopathy. Lungs/Pleura: Lungs are adequately inflated without focal airspace consolidation or effusion. There are a few peripheral and subpleural nodular densities bilaterally as some of these are along the fissures. Largest nodule within the right lung is over the lower lobe measuring 7 mm (image 103). Largest nodule within the right lung over the lingula measuring 5-6 mm (image 100). No effusion. Airways are normal. Musculoskeletal: Patchy lytic lesions over the thoracic spine and a few  posterior ribs suggesting metastatic disease. CT ABDOMEN PELVIS FINDINGS Hepatobiliary: Nodular contour of the liver suggesting cirrhosis. Previous cholecystectomy. Biliary tree is normal. Pancreas: Normal. Spleen: Several hypodense masses within the spleen with the largest over the left anterolateral aspect measuring 2.6 cm suggesting metastatic disease. Adrenals/Urinary Tract: Adrenal glands are normal and symmetric. Kidneys are normal in size without hydronephrosis. No focal mass. Ureters and bladder are normal. Stomach/Bowel: Stomach is under distended. Small bowel is within normal. Appendix is normal. Diverticulosis of the colon. Vascular/Lymphatic: Minimal calcified plaque over the abdominal aorta which is normal in caliber. Remaining vascular structures are unremarkable. No adenopathy in the abdomen/pelvis. Reproductive: Uterus and bilateral adnexa are unremarkable. Other: No free fluid or focal inflammatory change. Nonspecific rim calcified structure over the anterior left lower quadrant measuring 2.4 cm which is mobile as this was present on the previous pelvic CT over the left lower quadrant. Musculoskeletal: Posterior fusion hardware over the lumbosacral spine. Patchy lytic lesions throughout the thoracolumbar spine with several small lytic lesions over the pelvic bones suggesting metastatic disease. IMPRESSION: 1. Moderate mediastinal and bilateral hilar adenopathy as described. 2. Several peripheral and subpleural nodular densities bilaterally some along the fissures. Largest nodule within the right lung over the lower lobe measuring 7 mm. Largest nodule within the right lung over the lingula measuring 5-6 mm. Findings are nonspecific as metastatic disease is possible as recommend attention on follow-up. 3. Several hypodense masses within the spleen with the largest over the left anterolateral aspect measuring 2.6 cm suggesting metastatic disease. 4. Patchy lytic lesions throughout the thoracolumbar  spine with several small lytic lesions over the pelvic bones suggesting metastatic disease. 5. Morphologic liver changes suggesting cirrhosis. 6. Diverticulosis of the colon. 7. Aortic atherosclerosis. Aortic Atherosclerosis (ICD10-I70.0). Electronically Signed   By: Elberta Fortis M.D.   On: 09/22/2022 11:21   CT Head Wo Contrast  Result Date: 09/22/2022 CLINICAL DATA:  Delirium. EXAM: CT HEAD WITHOUT CONTRAST TECHNIQUE: Contiguous axial images were obtained from the base of the skull through the vertex without intravenous  contrast. RADIATION DOSE REDUCTION: This exam was performed according to the departmental dose-optimization program which includes automated exposure control, adjustment of the mA and/or kV according to patient size and/or use of iterative reconstruction technique. COMPARISON:  05/30/2021 brain MRI FINDINGS: Brain: No evidence of acute infarction, hemorrhage, hydrocephalus, extra-axial collection or mass lesion/mass effect. Chronic small vessel ischemia in the cerebral white matter. Mild cerebral volume loss Vascular: No hyperdense vessel or unexpected calcification. Skull: Multiple lytic lesions in the calvarium not seen on prior brain MRI, 1 of the largest along the right aspect of the coronal suture measuring 16 mm on 2:25. additional notable lesion at the upper right greater sphenoid wing measuring 2.1 cm, with inner table erosion. Sinuses/Orbits: Negative IMPRESSION: 1. Multiple lytic lesions in the calvarium, not present on brain MRI from last year, concerning for metastatic disease or myeloma. 2. No acute intracranial finding. Electronically Signed   By: Tiburcio Pea M.D.   On: 09/22/2022 08:50   DG Chest Portable 1 View  Result Date: 09/22/2022 CLINICAL DATA:  Altered mental status with weakness and foul-smelling urine. EXAM: PORTABLE CHEST 1 VIEW COMPARISON:  07/16/2021, mammogram 01/26/2015 FINDINGS: Lungs are adequately inflated without focal airspace consolidation or  effusion. Cardiomediastinal silhouette is normal. Extensive benign dystrophic calcification over the left breast soft tissues. Remainder the exam is unchanged. IMPRESSION: 1. No acute cardiopulmonary disease. 2. Extensive calcification over the left breast soft tissues. Electronically Signed   By: Elberta Fortis M.D.   On: 09/22/2022 07:53    Micro Results   Recent Results (from the past 240 hour(s))  SARS Coronavirus 2 by RT PCR (hospital order, performed in [facility] hospital lab) *cepheid single result test* Anterior Nasal Swab     Status: None   Collection Time: 09/22/22  8:16 AM   Specimen: Anterior Nasal Swab  Result Value Ref Range Status   SARS Coronavirus 2 by RT PCR NEGATIVE NEGATIVE Final    Comment: (NOTE) SARS-CoV-2 target nucleic acids are NOT DETECTED.  The SARS-CoV-2 RNA is generally detectable in upper and lower respiratory specimens during the acute phase of infection. The lowest concentration of SARS-CoV-2 viral copies this assay can detect is 250 copies / mL. A negative result does not preclude SARS-CoV-2 infection and should not be used as the sole basis for treatment or other patient management decisions.  A negative result may occur with improper specimen collection / handling, submission of specimen other than nasopharyngeal swab, presence of viral mutation(s) within the areas targeted by this assay, and inadequate number of viral copies (<250 copies / mL). A negative result must be combined with clinical observations, patient history, and epidemiological information.  Fact Sheet for Patients:   RoadLapTop.co.za  Fact Sheet for Healthcare Providers: http://kim-miller.com/  This test is not yet approved or  cleared by the [place] FDA and has been authorized for detection and/or diagnosis of SARS-CoV-2 by FDA under an Emergency Use Authorization (EUA).  This EUA will remain in effect (meaning this test can be  used) for the duration of the COVID-19 declaration under Section 564(b)(1) of the Act, 21 U.S.C. section 360bbb-3(b)(1), unless the authorization is terminated or revoked sooner.  Performed at [facility], [address], [place], [place] [address]   MRSA Next Gen by PCR, Nasal     Status: None   Collection Time: 09/22/22 10:15 PM   Specimen: Nasal Mucosa; Nasal Swab  Result Value Ref Range Status   MRSA by PCR Next Gen NOT DETECTED NOT DETECTED Final  Comment: (NOTE) The GeneXpert MRSA Assay (FDA approved for NASAL specimens only), is one component of a comprehensive MRSA colonization surveillance program. It is not intended to diagnose MRSA infection nor to guide or monitor treatment for MRSA infections. Test performance is not FDA approved in patients less than 67 years old. Performed at [facility], [address] [place], [place] [address]   MRSA Next Gen by PCR, Nasal     Status: None   Collection Time: 09/22/22 10:50 PM   Specimen: Nasal Mucosa; Nasal Swab  Result Value Ref Range Status   MRSA by PCR Next Gen NOT DETECTED NOT DETECTED Final    Comment: (NOTE) The GeneXpert MRSA Assay (FDA approved for NASAL specimens only), is one component of a comprehensive MRSA colonization surveillance program. It is not intended to diagnose MRSA infection nor to guide or monitor treatment for MRSA infections. Test performance is not FDA approved in patients less than 24 years old. Performed at [facility], [address] [place], [place] [address]    Today   Subjective    Maicee Zebley today has no new complaints  -Eating and drinking well  No Nausea, Vomiting or Diarrhea - No fever  Or chills           Patient has been seen and examined prior to discharge   Objective   Blood pressure (!) 177/76, pulse 91, temperature 98.5 F (36.9 C), resp. rate 18, height 5\' 4"  (1.626 m), weight 72.2 kg, SpO2 95%.   Intake/Output Summary (Last 24 hours) at 09/27/2022 1301 Last  data filed at 09/27/2022 0900 Gross per 24 hour  Intake 1320 ml  Output 700 ml  Net 620 ml   Exam Gen:- Awake Alert, no acute distress  HEENT:- [place].AT, No sclera icterus Neck-Supple Neck,No JVD,.  Lungs-  CTAB , good air movement bilaterally CV- S1, S2 normal, regular Abd-  +ve B.Sounds, Abd Soft, No tenderness,    Extremity/Skin:- No  edema,   good pulses Psych-affect is appropriate, oriented x3 Neuro-diminished L pedal strength and L foot drop (chronic) ,no additional New focal deficits, no tremors    Data Review   CBC w Diff:  Lab Results  Component Value Date   WBC 6.4 09/27/2022   HGB 10.6 (L) 09/27/2022   HCT 34.1 (L) 09/27/2022   PLT 220 09/27/2022   LYMPHOPCT 11 09/26/2022   MONOPCT 8 09/26/2022   EOSPCT 2 09/26/2022   BASOPCT 1 09/26/2022   CMP:  Lab Results  Component Value Date   NA 136 09/27/2022   K 3.5 09/27/2022   CL 106 09/27/2022   CO2 22 09/27/2022   BUN 11 09/27/2022   CREATININE 0.64 09/27/2022   PROT 6.6 09/23/2022   ALBUMIN 2.9 (L) 09/23/2022   BILITOT 0.8 09/23/2022   ALKPHOS 371 (H) 09/23/2022   AST 32 09/23/2022   ALT 8 09/23/2022   Total Discharge time is about 33 minutes  Shon Hale M.D on 09/27/2022 at 1:01 PM  Go to [email or web address] -  for contact info  [facility] - Office  [phone]

## 2022-09-27 NOTE — Plan of Care (Signed)
  Problem: Education: Goal: Knowledge of General Education information will improve Description: Including pain rating scale, medication(s)/side effects and non-pharmacologic comfort measures Outcome: Adequate for Discharge   Problem: Health Behavior/Discharge Planning: Goal: Ability to manage health-related needs will improve Outcome: Adequate for Discharge   Problem: Clinical Measurements: Goal: Ability to maintain clinical measurements within normal limits will improve Outcome: Adequate for Discharge Goal: Will remain free from infection Outcome: Adequate for Discharge Goal: Diagnostic test results will improve Outcome: Adequate for Discharge Goal: Respiratory complications will improve Outcome: Adequate for Discharge Goal: Cardiovascular complication will be avoided Outcome: Adequate for Discharge   Problem: Activity: Goal: Risk for activity intolerance will decrease Outcome: Adequate for Discharge   Problem: Nutrition: Goal: Adequate nutrition will be maintained Outcome: Adequate for Discharge   Problem: Coping: Goal: Level of anxiety will decrease Outcome: Adequate for Discharge   Problem: Elimination: Goal: Will not experience complications related to bowel motility Outcome: Adequate for Discharge Goal: Will not experience complications related to urinary retention Outcome: Adequate for Discharge   Problem: Pain Managment: Goal: General experience of comfort will improve Outcome: Adequate for Discharge   Problem: Safety: Goal: Ability to remain free from injury will improve Outcome: Adequate for Discharge   Problem: Skin Integrity: Goal: Risk for impaired skin integrity will decrease Outcome: Adequate for Discharge   Problem: Predicted Suboptimal Nutrient Intake ([id number]) Goal: Food and/or nutrient delivery Description: Individualized approach for food/nutrient provision. Outcome: Adequate for Discharge

## 2022-10-15 DIAGNOSIS — R531 Weakness: Secondary | ICD-10-CM | POA: Diagnosis not present

## 2022-10-15 DIAGNOSIS — C9000 Multiple myeloma not having achieved remission: Secondary | ICD-10-CM | POA: Diagnosis not present

## 2022-10-15 DIAGNOSIS — C7931 Secondary malignant neoplasm of brain: Secondary | ICD-10-CM | POA: Diagnosis not present

## 2022-10-18 DIAGNOSIS — Z6826 Body mass index (BMI) 26.0-26.9, adult: Secondary | ICD-10-CM | POA: Diagnosis not present

## 2022-10-18 DIAGNOSIS — F0150 Vascular dementia without behavioral disturbance: Secondary | ICD-10-CM | POA: Diagnosis not present

## 2022-10-18 DIAGNOSIS — C801 Malignant (primary) neoplasm, unspecified: Secondary | ICD-10-CM | POA: Diagnosis not present

## 2022-10-18 DIAGNOSIS — E669 Obesity, unspecified: Secondary | ICD-10-CM | POA: Diagnosis not present

## 2022-10-18 DIAGNOSIS — C800 Disseminated malignant neoplasm, unspecified: Secondary | ICD-10-CM | POA: Diagnosis not present

## 2022-10-18 DIAGNOSIS — C7951 Secondary malignant neoplasm of bone: Secondary | ICD-10-CM | POA: Diagnosis not present

## 2022-10-18 DIAGNOSIS — I890 Lymphedema, not elsewhere classified: Secondary | ICD-10-CM | POA: Diagnosis not present

## 2022-10-18 DIAGNOSIS — F0280 Dementia in other diseases classified elsewhere without behavioral disturbance: Secondary | ICD-10-CM | POA: Diagnosis not present

## 2022-10-18 DIAGNOSIS — I152 Hypertension secondary to endocrine disorders: Secondary | ICD-10-CM | POA: Diagnosis not present

## 2022-10-18 DIAGNOSIS — Z9181 History of falling: Secondary | ICD-10-CM | POA: Diagnosis not present

## 2022-10-18 DIAGNOSIS — G20B1 Parkinson's disease with dyskinesia, without mention of fluctuations: Secondary | ICD-10-CM | POA: Diagnosis not present

## 2022-10-18 DIAGNOSIS — E1159 Type 2 diabetes mellitus with other circulatory complications: Secondary | ICD-10-CM | POA: Diagnosis not present

## 2022-10-22 DIAGNOSIS — I152 Hypertension secondary to endocrine disorders: Secondary | ICD-10-CM | POA: Diagnosis not present

## 2022-10-22 DIAGNOSIS — E669 Obesity, unspecified: Secondary | ICD-10-CM | POA: Diagnosis not present

## 2022-10-22 DIAGNOSIS — I890 Lymphedema, not elsewhere classified: Secondary | ICD-10-CM | POA: Diagnosis not present

## 2022-10-22 DIAGNOSIS — G20B1 Parkinson's disease with dyskinesia, without mention of fluctuations: Secondary | ICD-10-CM | POA: Diagnosis not present

## 2022-10-22 DIAGNOSIS — F0280 Dementia in other diseases classified elsewhere without behavioral disturbance: Secondary | ICD-10-CM | POA: Diagnosis not present

## 2022-10-22 DIAGNOSIS — F0150 Vascular dementia without behavioral disturbance: Secondary | ICD-10-CM | POA: Diagnosis not present

## 2022-10-22 DIAGNOSIS — C7951 Secondary malignant neoplasm of bone: Secondary | ICD-10-CM | POA: Diagnosis not present

## 2022-10-22 DIAGNOSIS — Z9181 History of falling: Secondary | ICD-10-CM | POA: Diagnosis not present

## 2022-10-22 DIAGNOSIS — Z6826 Body mass index (BMI) 26.0-26.9, adult: Secondary | ICD-10-CM | POA: Diagnosis not present

## 2022-10-22 DIAGNOSIS — E1159 Type 2 diabetes mellitus with other circulatory complications: Secondary | ICD-10-CM | POA: Diagnosis not present

## 2022-10-22 DIAGNOSIS — C801 Malignant (primary) neoplasm, unspecified: Secondary | ICD-10-CM | POA: Diagnosis not present

## 2022-10-24 DIAGNOSIS — M4800 Spinal stenosis, site unspecified: Secondary | ICD-10-CM | POA: Diagnosis not present

## 2022-10-24 DIAGNOSIS — I130 Hypertensive heart and chronic kidney disease with heart failure and stage 1 through stage 4 chronic kidney disease, or unspecified chronic kidney disease: Secondary | ICD-10-CM | POA: Diagnosis not present

## 2022-10-24 DIAGNOSIS — G20A1 Parkinson's disease without dyskinesia, without mention of fluctuations: Secondary | ICD-10-CM | POA: Diagnosis not present

## 2022-10-24 DIAGNOSIS — R32 Unspecified urinary incontinence: Secondary | ICD-10-CM | POA: Diagnosis not present

## 2022-10-24 DIAGNOSIS — K5900 Constipation, unspecified: Secondary | ICD-10-CM | POA: Diagnosis not present

## 2022-10-24 DIAGNOSIS — F325 Major depressive disorder, single episode, in full remission: Secondary | ICD-10-CM | POA: Diagnosis not present

## 2022-10-24 DIAGNOSIS — M1990 Unspecified osteoarthritis, unspecified site: Secondary | ICD-10-CM | POA: Diagnosis not present

## 2022-10-24 DIAGNOSIS — I509 Heart failure, unspecified: Secondary | ICD-10-CM | POA: Diagnosis not present

## 2022-10-24 DIAGNOSIS — E785 Hyperlipidemia, unspecified: Secondary | ICD-10-CM | POA: Diagnosis not present

## 2022-10-24 DIAGNOSIS — G2581 Restless legs syndrome: Secondary | ICD-10-CM | POA: Diagnosis not present

## 2022-10-24 DIAGNOSIS — N182 Chronic kidney disease, stage 2 (mild): Secondary | ICD-10-CM | POA: Diagnosis not present

## 2022-10-24 DIAGNOSIS — M545 Low back pain, unspecified: Secondary | ICD-10-CM | POA: Diagnosis not present

## 2022-11-27 IMAGING — MR MR CERVICAL SPINE W/O CM
5 series · 37 of 48 positions shown · non-contrast
Comparison: None.

CLINICAL DATA: Chronic neck and back pain, and notes difficulty
walking.

EXAM:
MRI CERVICAL SPINE WITHOUT CONTRAST
TECHNIQUE: Multiplanar, multisequence MR imaging of the cervical spine was
performed. No intravenous contrast was administered.

[Series 5: T2 · sagittal · 3.0mm · 0.69mm/px · 7 of 15 slices shown (1 of 2)]
[im 1/15]
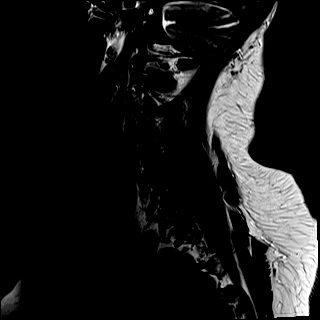
[im 3/15]
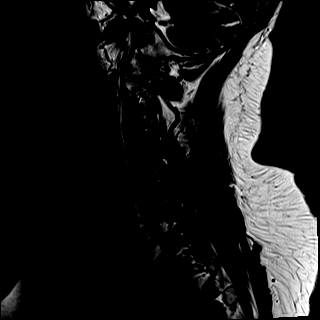
[im 5/15]
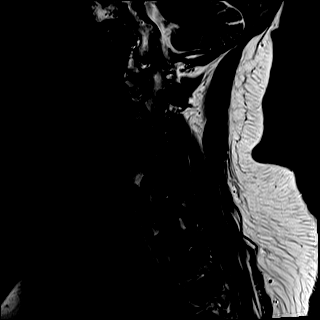
[im 8/15]
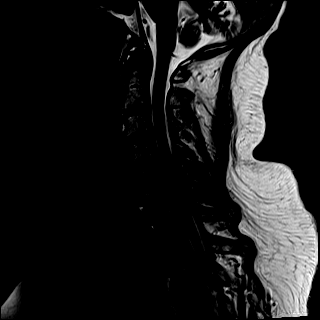
[im 10/15]
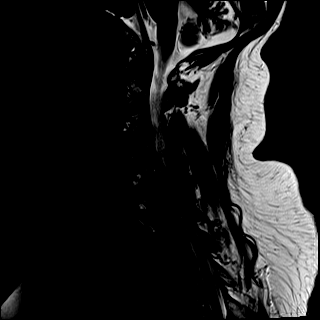
[im 12/15]
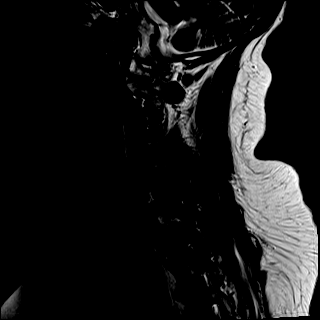
[im 15/15]
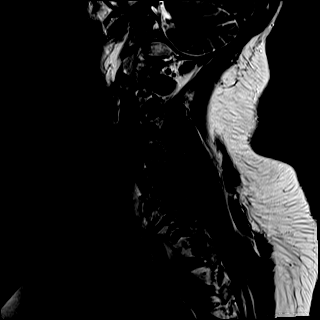

[Series 6: T1 · sagittal · 3.0mm · 0.86mm/px · 7 of 15 slices shown]
[im 1/15]
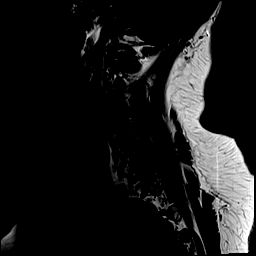
[im 3/15]
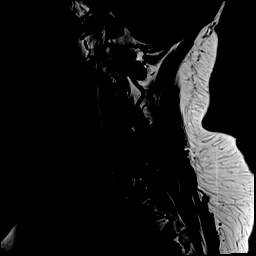
[im 5/15]
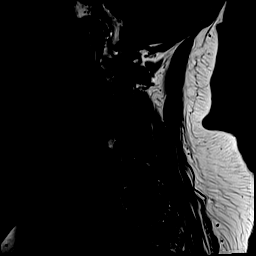
[im 8/15]
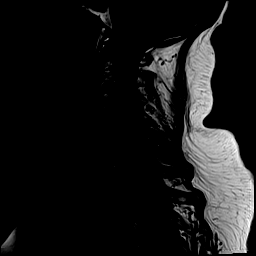
[im 10/15]
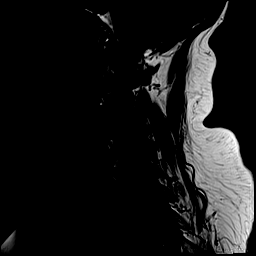
[im 12/15]
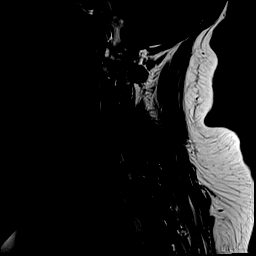
[im 15/15]
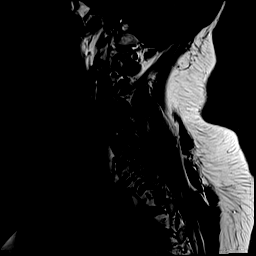

[Series 7: STIR · sagittal · 3.0mm · 0.69mm/px · 6 of 15 slices shown]
[im 1/15]
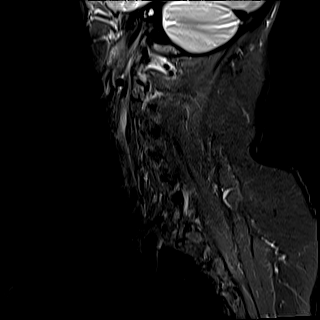
[im 3/15]
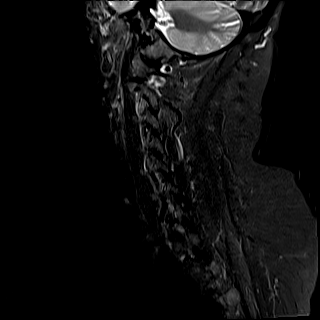
[im 6/15]
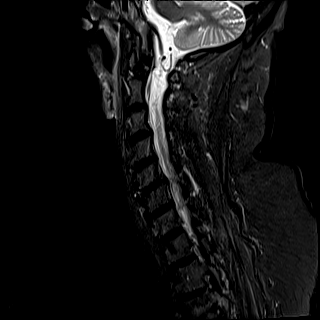
[im 9/15]
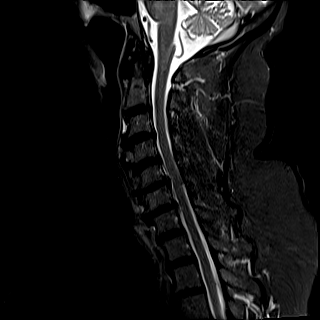
[im 12/15]
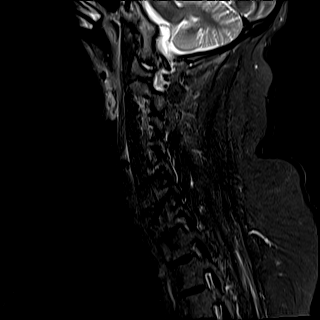
[im 15/15]
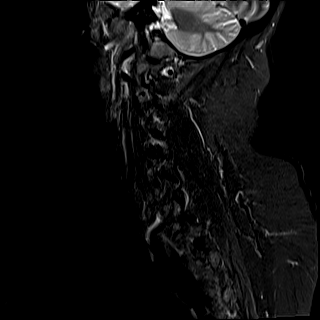

[Series 8: T2 · axial · 3.0mm · 0.70mm/px · z∈[-191,-93]mm · 9 of 34 slices shown (2 of 2)]
[im 1/34]
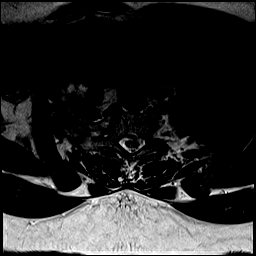
[im 3/34]
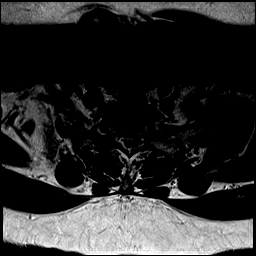
[im 6/34]
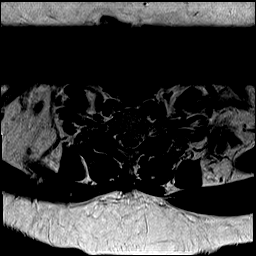
[im 11/34]
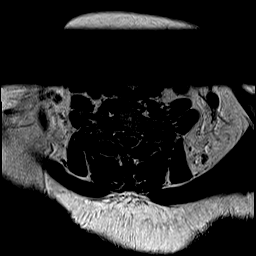
[im 16/34]
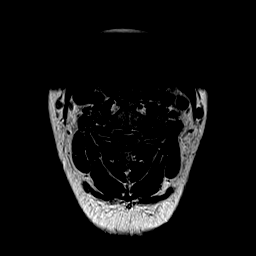
[im 18/34]
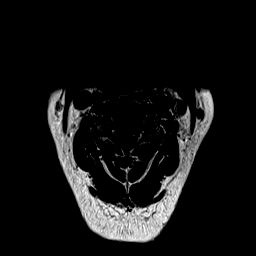
[im 23/34]
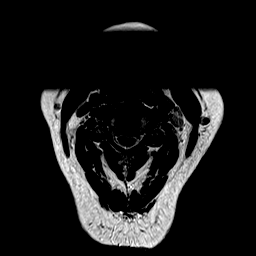
[im 28/34]
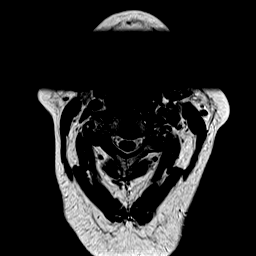
[im 34/34]
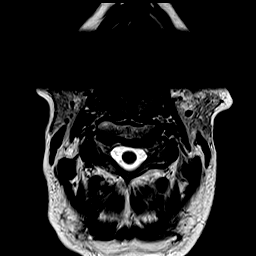

[Series 9: GRE · axial · 3.0mm · 0.35mm/px · z∈[-191,-93]mm · 8 of 34 slices shown]
[im 1/34]
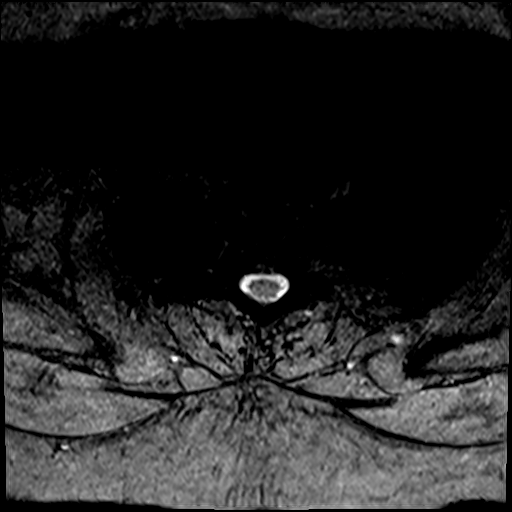
[im 6/34]
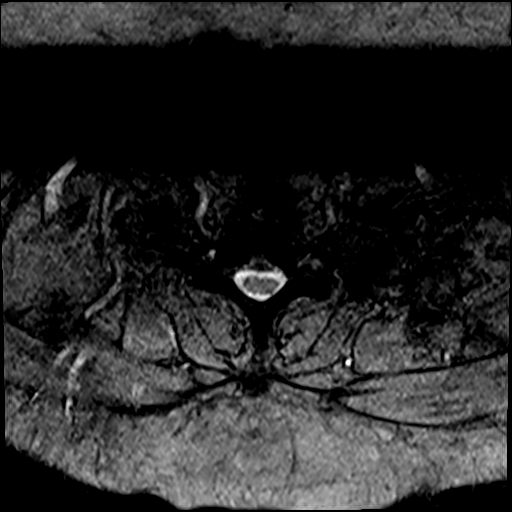
[im 11/34]
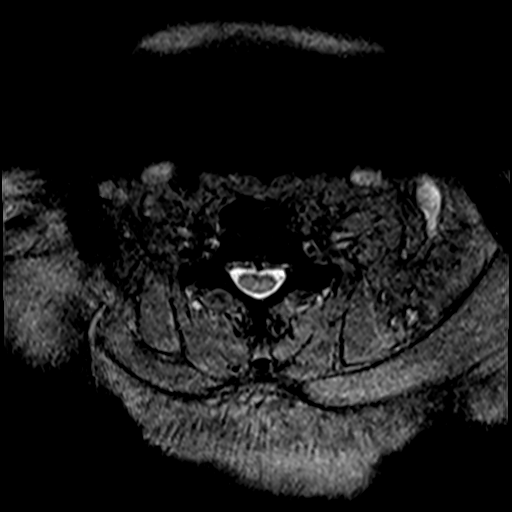
[im 16/34]
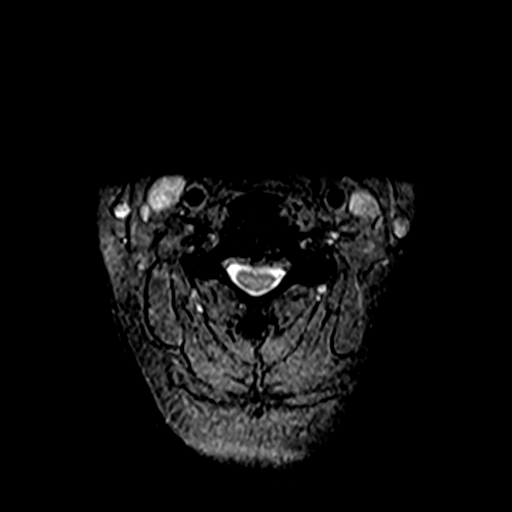
[im 18/34]
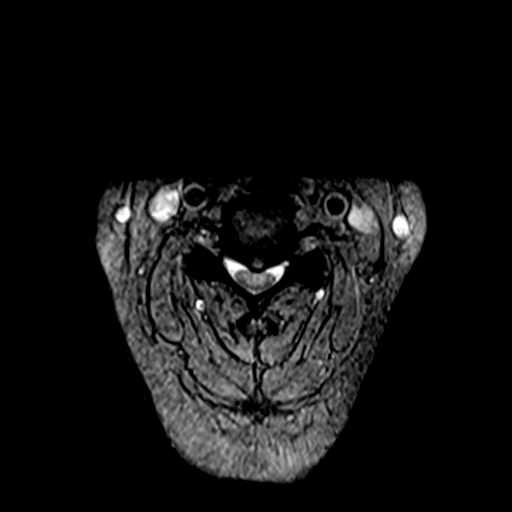
[im 23/34]
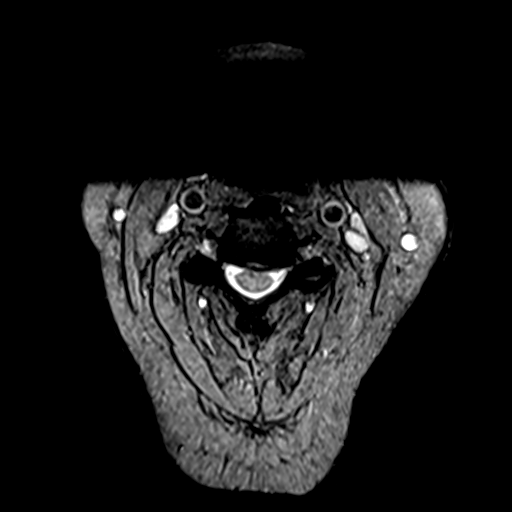
[im 28/34]
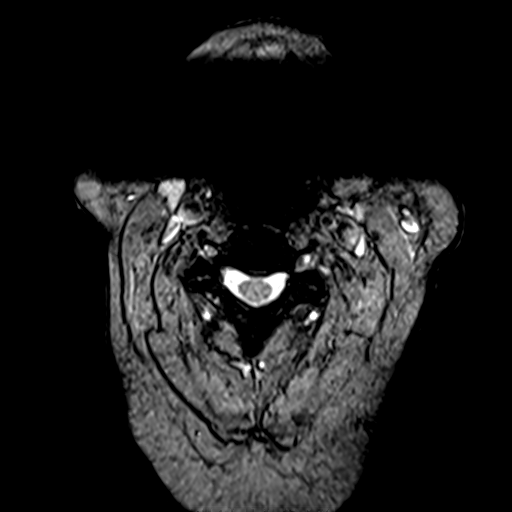
[im 34/34]
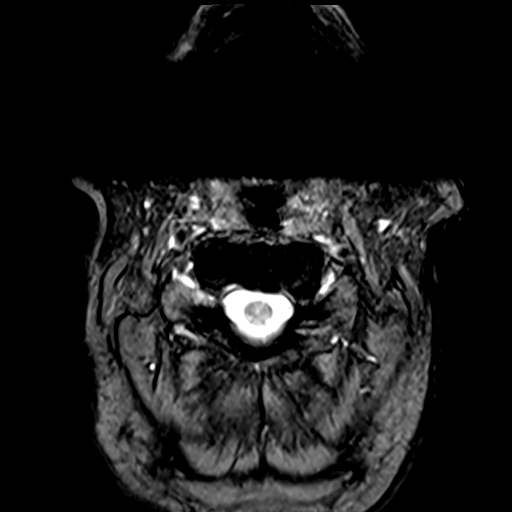

[37 of 48 positions shown; findings below may reference images not displayed]

FINDINGS: Alignment: Normal.

Vertebrae: Vertebral body heights are preserved. Marrow signal is
normal. There is no suspicious signal abnormality or marrow edema.
There are prominent anterior osteophytes throughout the cervical
spine.

Cord: Normal in signal and morphology.

Posterior Fossa, vertebral arteries, paraspinal tissues: Posterior
fossa is assessed on the separately dictated brain MRI. The
vertebral artery flow voids are present. The paraspinal soft tissues
are normal.

Disc levels:

The disc heights are overall preserved with mild multilevel disc
desiccation.

C2-C3: Mild left worse than right facet arthropathy without
significant spinal canal or neural foraminal stenosis.

C3-C4: There is a posterior disc osteophyte complex with bilateral
uncovertebral ridging and mild bilateral facet arthropathy resulting
in mild spinal canal stenosis and moderate to severe bilateral
neural foraminal stenosis.

C4-C5: There is a prominent central protrusion common bilateral
uncovertebral ridging, and mild bilateral facet arthropathy
resulting in moderate central spinal canal stenosis with indentation
of the cord and severe bilateral neural foraminal stenosis

C5-C6: There is a broad-based posterior disc osteophyte complex with
a probable left paracentral/foraminal protrusion, prominent
uncovertebral ridging, and bilateral facet arthropathy resulting in
moderate spinal canal stenosis and severe bilateral neural foraminal
stenosis.

C6-C7: There is a posterior disc osteophyte complex with a small
central protrusion resulting in mild to moderate spinal canal
stenosis with flattening of the ventral cord surface and severe left
and moderate right neural foraminal stenosis

C7-T1: No significant spinal canal or neural foraminal stenosis.
IMPRESSION: Multilevel degenerative changes throughout the cervical spine as
above resulting in moderate to severe bilateral neural foraminal
stenosis at C3-C4, moderate spinal canal stenosis and severe
bilateral neural foraminal stenosis at C4-C5 and C5-C6, and
mild-to-moderate spinal canal stenosis and severe left and moderate
right neural foraminal stenosis at C6-C7.

## 2022-12-06 DEATH — deceased

## 2023-02-11 ENCOUNTER — Telehealth: Payer: Self-pay | Admitting: Diagnostic Neuroimaging

## 2023-02-11 NOTE — Telephone Encounter (Signed)
 Pt passed away 12-25-2022.

## 2023-02-11 NOTE — Telephone Encounter (Signed)
 Noted, Sympathy card will be mailed to family.

## 2023-02-18 ENCOUNTER — Ambulatory Visit: Payer: Medicare HMO | Admitting: Diagnostic Neuroimaging

## 2023-02-24 ENCOUNTER — Ambulatory Visit: Payer: Self-pay | Admitting: Diagnostic Neuroimaging
